# Patient Record
Sex: Male | Born: 1935 | Race: White | Hispanic: No | Smoking: Never smoker
Health system: Southern US, Community
[De-identification: ages and names within clinical notes are randomized; demographics above are authoritative.]

## PROBLEM LIST (undated history)

## (undated) DIAGNOSIS — I639 Cerebral infarction, unspecified: Secondary | ICD-10-CM

## (undated) DIAGNOSIS — I219 Acute myocardial infarction, unspecified: Secondary | ICD-10-CM

## (undated) DIAGNOSIS — I779 Disorder of arteries and arterioles, unspecified: Secondary | ICD-10-CM

## (undated) DIAGNOSIS — I739 Peripheral vascular disease, unspecified: Secondary | ICD-10-CM

## (undated) DIAGNOSIS — I10 Essential (primary) hypertension: Secondary | ICD-10-CM

## (undated) DIAGNOSIS — N182 Chronic kidney disease, stage 2 (mild): Secondary | ICD-10-CM

## (undated) DIAGNOSIS — Z9289 Personal history of other medical treatment: Secondary | ICD-10-CM

## (undated) DIAGNOSIS — I2510 Atherosclerotic heart disease of native coronary artery without angina pectoris: Secondary | ICD-10-CM

## (undated) DIAGNOSIS — E785 Hyperlipidemia, unspecified: Secondary | ICD-10-CM

## (undated) DIAGNOSIS — I5032 Chronic diastolic (congestive) heart failure: Secondary | ICD-10-CM

## (undated) HISTORY — DX: Peripheral vascular disease, unspecified: I73.9

## (undated) HISTORY — DX: Atherosclerotic heart disease of native coronary artery without angina pectoris: I25.10

## (undated) HISTORY — DX: Disorder of arteries and arterioles, unspecified: I77.9

## (undated) HISTORY — DX: Personal history of other medical treatment: Z92.89

---

## 2004-11-15 HISTORY — PX: ANGIOPLASTY: SHX39

## 2005-09-11 ENCOUNTER — Inpatient Hospital Stay (HOSPITAL_COMMUNITY): Admission: EM | Admit: 2005-09-11 | Discharge: 2005-09-14 | Payer: Self-pay | Admitting: Emergency Medicine

## 2006-10-07 ENCOUNTER — Encounter: Admission: RE | Admit: 2006-10-07 | Discharge: 2006-10-07 | Payer: Self-pay | Admitting: Cardiovascular Disease

## 2006-10-12 ENCOUNTER — Ambulatory Visit (HOSPITAL_COMMUNITY): Admission: RE | Admit: 2006-10-12 | Discharge: 2006-10-12 | Payer: Self-pay | Admitting: Cardiovascular Disease

## 2008-11-22 ENCOUNTER — Ambulatory Visit (HOSPITAL_COMMUNITY): Admission: RE | Admit: 2008-11-22 | Discharge: 2008-11-22 | Payer: Self-pay | Admitting: Cardiovascular Disease

## 2009-12-16 DIAGNOSIS — Z9289 Personal history of other medical treatment: Secondary | ICD-10-CM

## 2009-12-16 HISTORY — DX: Personal history of other medical treatment: Z92.89

## 2009-12-21 ENCOUNTER — Inpatient Hospital Stay (HOSPITAL_COMMUNITY): Admission: EM | Admit: 2009-12-21 | Discharge: 2009-12-22 | Payer: Self-pay | Admitting: Emergency Medicine

## 2009-12-22 ENCOUNTER — Encounter (INDEPENDENT_AMBULATORY_CARE_PROVIDER_SITE_OTHER): Payer: Self-pay | Admitting: Cardiovascular Disease

## 2010-08-15 DIAGNOSIS — Z9289 Personal history of other medical treatment: Secondary | ICD-10-CM

## 2010-08-15 HISTORY — DX: Personal history of other medical treatment: Z92.89

## 2011-02-03 LAB — COMPREHENSIVE METABOLIC PANEL
ALT: 14 U/L (ref 0–53)
AST: 24 U/L (ref 0–37)
Albumin: 4 g/dL (ref 3.5–5.2)
Alkaline Phosphatase: 48 U/L (ref 39–117)
BUN: 12 mg/dL (ref 6–23)
CO2: 24 mEq/L (ref 19–32)
Calcium: 9.2 mg/dL (ref 8.4–10.5)
Chloride: 101 mEq/L (ref 96–112)
Creatinine, Ser: 1.01 mg/dL (ref 0.4–1.5)
GFR calc Af Amer: >60 mL/min (ref >60)
GFR calc non Af Amer: >60 mL/min (ref >60)
Glucose, Bld: 131 mg/dL — ABNORMAL HIGH (ref 70–99)
Potassium: 4.2 mEq/L (ref 3.5–5.1)
Sodium: 132 mEq/L — ABNORMAL LOW (ref 135–145)
Total Bilirubin: 0.5 mg/dL (ref 0.3–1.2)
Total Protein: 6.8 g/dL (ref 6.0–8.3)

## 2011-02-03 LAB — BASIC METABOLIC PANEL
BUN: 15 mg/dL (ref 6–23)
BUN: 24 mg/dL — ABNORMAL HIGH (ref 6–23)
CO2: 24 mEq/L (ref 19–32)
CO2: 28 mEq/L (ref 19–32)
Calcium: 8.8 mg/dL (ref 8.4–10.5)
Calcium: 8.8 mg/dL (ref 8.4–10.5)
Chloride: 102 mEq/L (ref 96–112)
Chloride: 97 mEq/L (ref 96–112)
Creatinine, Ser: 1.1 mg/dL (ref 0.4–1.5)
Creatinine, Ser: 1.36 mg/dL (ref 0.4–1.5)
GFR calc Af Amer: >60 mL/min (ref >60)
GFR calc Af Amer: >60 mL/min (ref >60)
GFR calc non Af Amer: 50 mL/min — ABNORMAL LOW (ref >60)
GFR calc non Af Amer: >60 mL/min (ref >60)
Glucose, Bld: 103 mg/dL — ABNORMAL HIGH (ref 70–99)
Glucose, Bld: 86 mg/dL (ref 70–99)
Potassium: 4.1 mEq/L (ref 3.5–5.1)
Potassium: 4.2 mEq/L (ref 3.5–5.1)
Sodium: 129 mEq/L — ABNORMAL LOW (ref 135–145)
Sodium: 134 mEq/L — ABNORMAL LOW (ref 135–145)

## 2011-02-03 LAB — CBC
HCT: 34.1 % — ABNORMAL LOW (ref 39.0–52.0)
HCT: 36.8 % — ABNORMAL LOW (ref 39.0–52.0)
Hemoglobin: 11.8 g/dL — ABNORMAL LOW (ref 13.0–17.0)
Hemoglobin: 12.7 g/dL — ABNORMAL LOW (ref 13.0–17.0)
MCHC: 34.5 g/dL (ref 30.0–36.0)
MCHC: 34.7 g/dL (ref 30.0–36.0)
MCV: 95.9 fL (ref 78.0–100.0)
MCV: 95.9 fL (ref 78.0–100.0)
Platelets: 173 10*3/uL (ref 150–400)
Platelets: 186 10*3/uL (ref 150–400)
RBC: 3.55 MIL/uL — ABNORMAL LOW (ref 4.22–5.81)
RBC: 3.84 MIL/uL — ABNORMAL LOW (ref 4.22–5.81)
RDW: 12.3 % (ref 11.5–15.5)
RDW: 12.7 % (ref 11.5–15.5)
WBC: 6 10*3/uL (ref 4.0–10.5)
WBC: 6.5 10*3/uL (ref 4.0–10.5)

## 2011-02-03 LAB — URINE MICROSCOPIC-ADD ON

## 2011-02-03 LAB — CK TOTAL AND CKMB (NOT AT ARMC)
CK, MB: 3.2 ng/mL (ref 0.3–4.0)
Relative Index: 3.1 — ABNORMAL HIGH (ref 0.0–2.5)
Total CK: 103 U/L (ref 7–232)

## 2011-02-03 LAB — CARDIAC PANEL(CRET KIN+CKTOT+MB+TROPI)
CK, MB: 2.7 ng/mL (ref 0.3–4.0)
CK, MB: 2.7 ng/mL (ref 0.3–4.0)
Relative Index: 2.6 — ABNORMAL HIGH (ref 0.0–2.5)
Relative Index: INVALID (ref 0.0–2.5)
Total CK: 105 U/L (ref 7–232)
Total CK: 97 U/L (ref 7–232)
Troponin I: 0.02 ng/mL (ref 0.00–0.06)
Troponin I: 0.02 ng/mL (ref 0.00–0.06)

## 2011-02-03 LAB — POCT I-STAT, CHEM 8
BUN: 18 mg/dL (ref 6–23)
Calcium, Ion: 1.2 mmol/L (ref 1.12–1.32)
Chloride: 97 mEq/L (ref 96–112)
Creatinine, Ser: 1.2 mg/dL (ref 0.4–1.5)
Glucose, Bld: 133 mg/dL — ABNORMAL HIGH (ref 70–99)
HCT: 34 % — ABNORMAL LOW (ref 39.0–52.0)
Hemoglobin: 11.6 g/dL — ABNORMAL LOW (ref 13.0–17.0)
Potassium: 3.7 mEq/L (ref 3.5–5.1)
Sodium: 131 mEq/L — ABNORMAL LOW (ref 135–145)
TCO2: 29 mmol/L (ref 0–100)

## 2011-02-03 LAB — TROPONIN I: Troponin I: 0.02 ng/mL (ref 0.00–0.06)

## 2011-02-03 LAB — URINALYSIS, ROUTINE W REFLEX MICROSCOPIC
Bilirubin Urine: NEGATIVE
Glucose, UA: NEGATIVE mg/dL
Hgb urine dipstick: NEGATIVE
Ketones, ur: 15 mg/dL — AB
Leukocytes, UA: NEGATIVE
Nitrite: NEGATIVE
Protein, ur: 30 mg/dL — AB
Specific Gravity, Urine: 1.019 (ref 1.005–1.030)
Urobilinogen, UA: 1 mg/dL (ref 0.0–1.0)
pH: 8 (ref 5.0–8.0)

## 2011-02-03 LAB — TSH: TSH: 1.509 u[IU]/mL (ref 0.350–4.500)

## 2011-02-03 LAB — MAGNESIUM: Magnesium: 2.3 mg/dL (ref 1.5–2.5)

## 2011-02-03 LAB — D-DIMER, QUANTITATIVE: D-Dimer, Quant: 0.41 ug/mL-FEU (ref 0.00–0.48)

## 2011-04-02 NOTE — Cardiovascular Report (Signed)
[name], Montgomery                ACCOUNT NO.:  [id number]   MEDICAL RECORD NO.:  [id number]          PATIENT TYPE:  INP   LOCATION:  3729                         FACILITY:  [facility]   PHYSICIAN:  John Montgomery, M.D.DATE OF BIRTH:  October 28, 1921   DATE OF PROCEDURE:  09/13/2005  DATE OF DISCHARGE:                              CARDIAC CATHETERIZATION   PROCEDURES PERFORMED:  1.  Selective coronary angiography by Judkins technique.  2.  Urgent percutaneous coronary intervention to the mid RCA for tight      stenosis and loss of retrograde collaterals from LAD during angiography.  3.  Ostial RCA stenosis requiring ostial stenting.  4.  Left ventricular angiography.  5.  Left and right coronary arteriography.   There were no complications during this case. The shutdown of left-to-right  collaterals occurring during the case that caused ST-segment elevation was  managed with percutaneous intervention immediately.   PATIENT PROFILE:  [name] is an 77 year old gentleman who is a  cardiology patient of Dr. Nanetta Montgomery who had a percutaneous coronary  intervention 12 years ago who had done well in his usual state of health  until several weeks ago when he began having substernal chest pain with  exertion that was relieved by rest. He entered the hospital on September 11, 2005, and had improvement of chest pressure with heparin and intravenous  nitroglycerin. His EKGs have shown mild T-wave abnormalities in the inferior  leads minimally suggestive of ischemia. He has hypertension as well. He was  brought to the catheterization laboratory electively for catheterization and  ultimately underwent emergency percutaneous intervention with a good result  and no residual chest pain at the end of the case.   RESULTS:  Pressures:  Left ventricular pressure was 125/3, end-diastolic  pressure at 13, central aortic pressure 125/50, mean of 85. No aortic valve  gradient by pullback  technique.   ANGIOGRAPHIC RESULTS:  The first injection into the left main coronary  artery and LAD and circumflex system showed calcifications in the circumflex  and proximal LAD. The left main itself was a fairly short vessel, medium in  size, and no significant stenoses were noted. The LAD was proximally  calcified. There were luminal irregularities at the bifurcation of the LAD  and to the major diagonal which was #3 diagonal. There were no significant  stenoses throughout the LAD of an obstructive type. Two very small diagonal  branches were diseased in the ostial portions but these were very small. The  trifurcating third diagonal was minimally diseased.   Left circumflex was nondominant and gave rise to fairly large atrial  circumflex branches which provided a brisk collateral bed to the distal  portions of the RCA. After the initial injection of the LAD and circumflex,  the collateral flow abruptly stopped and was not seen further during the  case, and the patient's ST segments elevated. I continued to take pictures  of the LAD to see what had happened and no high-grade stenoses were seen in  any of the LAD vessels. There was minimal nonobstructive disease.   The  right coronary artery was difficult to intubate. There was scattered  calcification. The right Judkins catheter would not enter. I used a guide  catheter was which was a Cordis XR 3.5C and entered the vessel and then  noted that there was not only an ostial stenosis in the vessel with some  hazy filling of the ostium, but there was also a high-grade stenosis in the  mid portion of the vessel before an acute marginal branch that was 99%  stenosed over a segment approximately 12-15 mm in length. There was now  antegrade flow into the distal RCA posterolateral branches and posterior  descending as well. This RCA was large and dominant. It was the culprit  vessel in the patient's angina.   Because of the ST-segment  elevation, I felt uncomfortable with proceeding  any further with LV angiography. I went straight to Integrilin. I noted that  the ACT was 297 on the patient's discontinued heparin from the floor. We  prepared him with Pepcid and ultimately gave 300 of Plavix and we went  straight to the percutaneous intervention.   RCA intervention was accomplished with the previous guide catheter mentioned  which was the Cordis XR 3.5 guide, which was an excellent choice. The  guidewire was a Prowater and the balloon used to predilate the vessel was a  2.5 x 20 mm Maverick. After redilatation with that I was able to then get a  3.0 x 28 mm stent down into the mid RCA, but only after encountering extreme  difficulty advancing the stent into the left main ostium because of the  calcified plaque that existed there. I was finally able to maneuver the  stent down to the mid portion of the RCA and deployed it twice to 15  atmospheres for periods up to a minute each time, and noted extreme  improvement of and eradication of the previously-stenotic complex mid RCA  stenosis.   I was next able to focus on the ostial portion of the vessel keeping the  same guide wire in place, and was able to deploy a 3.0 x 13 mm ostial Cypher  stent, making sure to get it as close to the ostium of the vessel without  spilling over too much into the aorta. I think I accomplished that by  persistence.   After deployment of that stent, the hazy 75% stenosis was eradicated and  there was no haziness left. The RCA after two-vessel intervention looked  pristine. It was a large dominant vessel with no significant residual  stenosis and there was brisk TIMI 3 flow throughout. The ST segments  basically when away after complete revascularization of the right.   I then turned my attention toward a left ventricular angiogram and confirmed  zero evidence of inferior wall hypokinesis. That ejection fraction was 60% and no mitral  regurgitation was noted.   The patient tolerated the procedure well. The ST segments resolved,  transient hypotension was managed, and the patient left the catheterization  laboratory without any pain, stable vital signs, with an excellent result.   FINAL DIAGNOSES:  1.  Unstable angina secondary to a high-grade stenosis in mid right coronary      artery with complicating ostial right coronary artery disease and a      large dominant right coronary artery.  2.  Left anterior descending coronary artery trivially diseased midway down      and in two diagonals not worthy of or amenable to percutaneous  intervention.  3.  Strong left-to-right collateral flow which was eradicated by the first      injection into the left anterior descending coronary artery causing      abrupt ischemia due to competitive flow.  4.  Successful percutaneous stenting of the mid right coronary artery and      ostial right coronary artery with eradication of high-grade stenoses to      0% residual, and restoration of TIMI 3 flow from TIMI 2 flow initially.   PLAN:  The patient will be on aspirin and Plavix for at least 6 months. He  will be discharged tomorrow if his cardiac enzymes were negative. His EKG  pre and post procedure showed only trivial worsening of his mild T-wave  inversions. There were no ST-segment elevations.           ______________________________  John Montgomery, M.D.     WHG/MEDQ  D:  09/13/2005  T:  09/13/2005  Job:  [id number]   cc:   [facility]

## 2011-04-02 NOTE — Discharge Summary (Signed)
John Montgomery, John Montgomery                ACCOUNT NO.:  [id number]   MEDICAL RECORD NO.:  [id number]          PATIENT TYPE:  INP   LOCATION:  6524                         FACILITY:  [facility]   PHYSICIAN:  Nanetta Batty, M.D.   DATE OF BIRTH:  12-18-1920   DATE OF ADMISSION:  09/11/2005  DATE OF DISCHARGE:  09/14/2005                                 DISCHARGE SUMMARY   ADMISSION DIAGNOSES:  1.  Unstable angina.  2.  Coronary artery disease with a remote history of percutaneous coronary      intervention.  3.  Hypertension.  4.  Hypercholesterolemia.  5.  Right carotid bruit.   DISCHARGE DIAGNOSES:  1.  Coronary artery disease with percutaneous coronary intervention of the      right coronary artery, ostial and mid right coronary artery, using      Cypher stents.  2.  Hypertension.  3.  Hyperlipidemia.   PROCEDURE:  Cardiac catheterization September 13, 2005, Dr. Chanda Busing,  with urgent PTCA intervention to the mid RCA for a tight stenosis and loss  of retrograde collaterals from the LAD, RCA ostial stenosis, requiring  ostial stenting, left ventricular angiography, and right and left coronary  arteriography.   BRIEF HISTORY:  The patient is an 77 year old gentleman with a remote  history of PTCA 12 years ago. He had done well until last several weeks when  he noted subscapular chest pain while riding an exercise bike. On the  morning of admission, he awoke around 3:30 with chest pain, which resolved  with spontaneously. He now presents to the ER with chest discomfort.   PAST MEDICAL HISTORY:  Includes hypertension, hyperlipidemia and prior PTCA.   MEDICATIONS:  1.  Altace 5 mg daily.  2.  Aspirin 81 mg daily.  3.  Norvasc 2.5 mg daily.  4.  Hydrochlorothiazide 12.5 mg daily.  5.  Pravachol 20 mg daily.  6.  Toprol 25 mg daily.   ALLERGIES:  PENICILLIN.   SOCIAL HISTORY:  He lives with 2 sons. He is a widower; 3 children and 8  grandchildren. He is a nonsmoker.   FAMILY  HISTORY:  Noncontributory.   REVIEW OF SYSTEMS:  Negative.   HOSPITAL COURSE:  The patient was admitted. Telemetry showed a sinus rhythm.  There were no significant EKG changes. Troponins were 0.27 following a.m.  and patient was taken to the cath lab. He had been stabilized with heparin  and nitroglycerin. AT that time, cath showed 75% RCA ostial stenosis  followed by an 85% and 99.9% mid RCA stenosis. He had an LAD which was  patent, he had a 90% D2 but otherwise no problems there. The circ and OM  were open. The circ supplied collaterals to the right. Dr. Elsie Lincoln performed  PCI and stenting using a 3 x 13 ostial RCA stent and a 3 x 28 mid RCA Cypher  stent. The patient tolerated the procedure well. The following day was his  birthday. He was mobilized.  Labs looked good. Creatinine was 1.2, BUN was  14, and electrolytes were normal. Hemoglobin was 11.7 and hematocrit was  34.2. White count of 7. Platelets were 162,000.  He was mobilized, did well,  and was subsequently discharged home later that day.   DISCHARGE MEDICATIONS:  1.  Coated aspirin 325 mg daily.  2.  Hydrochlorothiazide 12.5 mg daily.  3.  Norvasc 2.5 mg daily.  4.  Protonix 40 mg daily.  5.  Plavix 75 mg daily.  6.  Toprol XL 25 mg daily.  7.  Pravachol 20 mg daily.  8.  Altace 5 mg daily.  9.  He also was given bacitracin to his skin arm rash.   FOLLOW UP:  He is scheduled to come back and see Dr. Allyson Sabal in 2 weeks.   DISCHARGE ACTIVITY:  Light to moderate. No lifting over 10 pounds. No  driving, no strenuous activity.   CONDITION ON DISCHARGE:  Improved.      Eber Hong, P.A.      Nanetta Batty, M.D.  Electronically Signed    WDJ/MEDQ  D:  11/05/2005  T:  11/08/2005  Job:  [id number]   cc:   Veverly Fells. Altheimer, M.D.  Fax: [phone]

## 2011-04-02 NOTE — Cardiovascular Report (Signed)
NAMEKENT, RIENDEAU                ACCOUNT NO.:  [id number]   MEDICAL RECORD NO.:  [id number]          PATIENT TYPE:  AMB   LOCATION:  SDS                          FACILITY:  [facility]   PHYSICIAN:  Nanetta Batty, M.D.   DATE OF BIRTH:  June 05, 1921   DATE OF PROCEDURE:  DATE OF DISCHARGE:  10/12/2006                            CARDIAC CATHETERIZATION   Mr. Knotek is an 77 year old gentleman with history of CAD status post  remote RCA PCI in 1995.  He has stenting of his RCA by Dr. Elsie Lincoln  September 13, 2005 with Cypher drug-eluting stent.  His other problems  include hypertension and hyperlipidemia.  He is a fairly active man and  walks religiously.  We have been following his carotid studies with the  most recent one performed and revealed progression of disease in his  right bulb and ICA, though he was neurologically asymptomatic.  He  presents now for outpatient diagnostic cerebral angiography to define  his anatomy.   DESCRIPTION OF PROCEDURE:  The patient is brought to the second floor  [facility] angiographic suite in the postabsorptive state.  He is not  premedicated.  His right groin was prepped and shaved in the usual  sterile fashion. Xylocaine 1% was used for local anesthesia.  A 5-French  sheath was inserted into the right femoral artery using standard  Seldinger technique.  A 5-French long tennis racket catheter and JB-1  catheters were used for arch angiography, selective right and left  vertebral, left carotid angiography both intra and extracranial views.  Visipaque dye was used for the entirety of the case.  Aortic pressures  monitored during the case.   ANGIOGRAPHIC RESULTS:  1. Arch angiogram:  The patient had a type 1 arch.  2. Left vertebral:  Normal intra and extracranial anatomy.  3. Right carotid:  There was fluoroscopic calcium at the bifurcation      of the bulb but only about 20% luminal narrowing at the origin of      the right internal carotid artery.  The  intracranial anatomy was      normal.  4. Left carotid:  Normal.  5. Left vertebral:  Normal.   IMPRESSION:  Mr. Magallon has essentially normal cerebral angiogram.  His  carotid Dopplers was false positive.  Sheath was removed and pressure  was held on the groin to achieve hemostasis.  The patient left the lab  in stable condition.  He will be discharged home as an outpatient later  today and was seen back in the office in approximately a week for follow-  up.      Nanetta Batty, M.D.  Electronically Signed    JB/MEDQ  D:  10/12/2006  T:  10/13/2006  Job:  [id number]   cc:   Patient's chart  2nd Floor [facility] Suite  [facility]  Veverly Fells. Altheimer, M.D.

## 2011-11-16 DIAGNOSIS — I639 Cerebral infarction, unspecified: Secondary | ICD-10-CM

## 2011-11-16 HISTORY — DX: Cerebral infarction, unspecified: I63.9

## 2012-10-16 ENCOUNTER — Emergency Department (HOSPITAL_COMMUNITY): Payer: Medicare Other

## 2012-10-16 ENCOUNTER — Encounter (HOSPITAL_COMMUNITY): Payer: Self-pay | Admitting: *Deleted

## 2012-10-16 ENCOUNTER — Inpatient Hospital Stay (HOSPITAL_COMMUNITY)
Admission: EM | Admit: 2012-10-16 | Discharge: 2012-10-20 | DRG: 086 | Disposition: A | Discharge status: Rehabilitation Facility | Payer: Medicare Other | Attending: Neurosurgery | Admitting: Neurosurgery

## 2012-10-16 DIAGNOSIS — D62 Acute posthemorrhagic anemia: Secondary | ICD-10-CM | POA: Diagnosis present

## 2012-10-16 DIAGNOSIS — I480 Paroxysmal atrial fibrillation: Secondary | ICD-10-CM | POA: Diagnosis not present

## 2012-10-16 DIAGNOSIS — E871 Hypo-osmolality and hyponatremia: Secondary | ICD-10-CM | POA: Diagnosis present

## 2012-10-16 DIAGNOSIS — I252 Old myocardial infarction: Secondary | ICD-10-CM

## 2012-10-16 DIAGNOSIS — G8190 Hemiplegia, unspecified affecting unspecified side: Secondary | ICD-10-CM | POA: Diagnosis present

## 2012-10-16 DIAGNOSIS — E785 Hyperlipidemia, unspecified: Secondary | ICD-10-CM | POA: Diagnosis present

## 2012-10-16 DIAGNOSIS — Z9861 Coronary angioplasty status: Secondary | ICD-10-CM

## 2012-10-16 DIAGNOSIS — I4891 Unspecified atrial fibrillation: Secondary | ICD-10-CM | POA: Diagnosis present

## 2012-10-16 DIAGNOSIS — I6529 Occlusion and stenosis of unspecified carotid artery: Secondary | ICD-10-CM | POA: Diagnosis present

## 2012-10-16 DIAGNOSIS — I440 Atrioventricular block, first degree: Secondary | ICD-10-CM | POA: Diagnosis not present

## 2012-10-16 DIAGNOSIS — R4701 Aphasia: Secondary | ICD-10-CM | POA: Diagnosis present

## 2012-10-16 DIAGNOSIS — IMO0002 Reserved for concepts with insufficient information to code with codable children: Secondary | ICD-10-CM | POA: Diagnosis present

## 2012-10-16 DIAGNOSIS — I5189 Other ill-defined heart diseases: Secondary | ICD-10-CM | POA: Diagnosis present

## 2012-10-16 DIAGNOSIS — I2510 Atherosclerotic heart disease of native coronary artery without angina pectoris: Secondary | ICD-10-CM | POA: Diagnosis present

## 2012-10-16 DIAGNOSIS — Z79899 Other long term (current) drug therapy: Secondary | ICD-10-CM

## 2012-10-16 DIAGNOSIS — S06369A Traumatic hemorrhage of cerebrum, unspecified, with loss of consciousness of unspecified duration, initial encounter: Secondary | ICD-10-CM

## 2012-10-16 DIAGNOSIS — I10 Essential (primary) hypertension: Secondary | ICD-10-CM | POA: Diagnosis present

## 2012-10-16 DIAGNOSIS — S0101XA Laceration without foreign body of scalp, initial encounter: Secondary | ICD-10-CM

## 2012-10-16 DIAGNOSIS — I739 Peripheral vascular disease, unspecified: Secondary | ICD-10-CM | POA: Diagnosis present

## 2012-10-16 DIAGNOSIS — S065XAA Traumatic subdural hemorrhage with loss of consciousness status unknown, initial encounter: Principal | ICD-10-CM | POA: Diagnosis present

## 2012-10-16 DIAGNOSIS — Z880 Allergy status to penicillin: Secondary | ICD-10-CM

## 2012-10-16 DIAGNOSIS — S065X9A Traumatic subdural hemorrhage with loss of consciousness of unspecified duration, initial encounter: Secondary | ICD-10-CM | POA: Diagnosis present

## 2012-10-16 DIAGNOSIS — Y92009 Unspecified place in unspecified non-institutional (private) residence as the place of occurrence of the external cause: Secondary | ICD-10-CM

## 2012-10-16 DIAGNOSIS — S0100XA Unspecified open wound of scalp, initial encounter: Secondary | ICD-10-CM | POA: Diagnosis present

## 2012-10-16 HISTORY — DX: Essential (primary) hypertension: I10

## 2012-10-16 HISTORY — DX: Hyperlipidemia, unspecified: E78.5

## 2012-10-16 HISTORY — DX: Acute myocardial infarction, unspecified: I21.9

## 2012-10-16 LAB — CBC WITH DIFFERENTIAL/PLATELET
Basophils Absolute: 0 10*3/uL (ref 0.0–0.1)
Basophils Relative: 0 % (ref 0–1)
Eosinophils Absolute: 0.1 10*3/uL (ref 0.0–0.7)
Eosinophils Relative: 1 % (ref 0–5)
HCT: 32.8 % — ABNORMAL LOW (ref 39.0–52.0)
Hemoglobin: 11.7 g/dL — ABNORMAL LOW (ref 13.0–17.0)
Lymphocytes Relative: 20 % (ref 12–46)
Lymphs Abs: 1.4 10*3/uL (ref 0.7–4.0)
MCH: 31.1 pg (ref 26.0–34.0)
MCHC: 35.7 g/dL (ref 30.0–36.0)
MCV: 87.2 fL (ref 78.0–100.0)
Monocytes Absolute: 0.6 10*3/uL (ref 0.1–1.0)
Monocytes Relative: 9 % (ref 3–12)
Neutro Abs: 4.6 10*3/uL (ref 1.7–7.7)
Neutrophils Relative %: 69 % (ref 43–77)
Platelets: 177 10*3/uL (ref 150–400)
RBC: 3.76 MIL/uL — ABNORMAL LOW (ref 4.22–5.81)
RDW: 12 % (ref 11.5–15.5)
WBC: 6.7 10*3/uL (ref 4.0–10.5)

## 2012-10-16 LAB — COMPREHENSIVE METABOLIC PANEL
ALT: 12 U/L (ref 0–53)
AST: 22 U/L (ref 0–37)
Albumin: 4 g/dL (ref 3.5–5.2)
Alkaline Phosphatase: 51 U/L (ref 39–117)
BUN: 19 mg/dL (ref 6–23)
CO2: 22 mEq/L (ref 19–32)
Calcium: 9.1 mg/dL (ref 8.4–10.5)
Chloride: 92 mEq/L — ABNORMAL LOW (ref 96–112)
Creatinine, Ser: 1.08 mg/dL (ref 0.50–1.35)
GFR calc Af Amer: 68 mL/min — ABNORMAL LOW (ref >90)
GFR calc non Af Amer: 59 mL/min — ABNORMAL LOW (ref >90)
Glucose, Bld: 97 mg/dL (ref 70–99)
Potassium: 4.1 mEq/L (ref 3.5–5.1)
Sodium: 125 mEq/L — ABNORMAL LOW (ref 135–145)
Total Bilirubin: 0.3 mg/dL (ref 0.3–1.2)
Total Protein: 6.7 g/dL (ref 6.0–8.3)

## 2012-10-16 LAB — PLATELET FUNCTION ASSAY: Collagen / Epinephrine: 104 seconds (ref 0–184)

## 2012-10-16 LAB — POCT I-STAT, CHEM 8
BUN: 20 mg/dL (ref 6–23)
Calcium, Ion: 1.14 mmol/L (ref 1.13–1.30)
Chloride: 97 mEq/L (ref 96–112)
Creatinine, Ser: 1.1 mg/dL (ref 0.50–1.35)
Glucose, Bld: 96 mg/dL (ref 70–99)
HCT: 35 % — ABNORMAL LOW (ref 39.0–52.0)
Hemoglobin: 11.9 g/dL — ABNORMAL LOW (ref 13.0–17.0)
Potassium: 4.1 mEq/L (ref 3.5–5.1)
Sodium: 128 mEq/L — ABNORMAL LOW (ref 135–145)
TCO2: 21 mmol/L (ref 0–100)

## 2012-10-16 LAB — SAMPLE TO BLOOD BANK

## 2012-10-16 LAB — ABO/RH: ABO/RH(D): A POS

## 2012-10-16 LAB — PROTIME-INR
INR: 0.99 (ref 0.00–1.49)
Prothrombin Time: 13 seconds (ref 11.6–15.2)

## 2012-10-16 MED ORDER — ROCURONIUM BROMIDE 50 MG/5ML IV SOLN
INTRAVENOUS | Status: AC
  Filled 2012-10-16: qty 2

## 2012-10-16 MED ORDER — MORPHINE SULFATE 2 MG/ML IJ SOLN
INTRAMUSCULAR | Status: AC
  Administered 2012-10-16: 2 mg via INTRAVENOUS
  Filled 2012-10-16: qty 1

## 2012-10-16 MED ORDER — MORPHINE SULFATE 2 MG/ML IJ SOLN
1.0000 mg | INTRAMUSCULAR | Status: DC | PRN
  Administered 2012-10-17: 2 mg via INTRAVENOUS
  Filled 2012-10-16: qty 1

## 2012-10-16 MED ORDER — ACETAMINOPHEN-CODEINE #3 300-30 MG PO TABS
1.0000 | ORAL_TABLET | ORAL | Status: DC | PRN
  Administered 2012-10-18 – 2012-10-20 (×3): 1 via ORAL
  Filled 2012-10-16 (×3): qty 1

## 2012-10-16 MED ORDER — SODIUM CHLORIDE 0.9 % IV SOLN
INTRAVENOUS | Status: DC
Start: 2012-10-16 — End: 2012-10-20
  Administered 2012-10-16 – 2012-10-18 (×3): via INTRAVENOUS
  Administered 2012-10-18: 1000 mL via INTRAVENOUS
  Administered 2012-10-19: 10:00:00 via INTRAVENOUS
  Administered 2012-10-19 – 2012-10-20 (×2): 1000 mL via INTRAVENOUS

## 2012-10-16 MED ORDER — ACETAMINOPHEN 650 MG RE SUPP: 650.0000 mg | RECTAL | Status: DC | PRN

## 2012-10-16 MED ORDER — NICARDIPINE HCL IN NACL 20-0.86 MG/200ML-% IV SOLN
5.0000 mg/h | Freq: Once | INTRAVENOUS | Status: DC
  Filled 2012-10-16: qty 200

## 2012-10-16 MED ORDER — LIDOCAINE HCL (CARDIAC) 20 MG/ML IV SOLN
INTRAVENOUS | Status: AC
  Filled 2012-10-16: qty 5

## 2012-10-16 MED ORDER — ACETAMINOPHEN 325 MG PO TABS: 650.0000 mg | ORAL_TABLET | ORAL | Status: DC | PRN

## 2012-10-16 MED ORDER — SENNOSIDES-DOCUSATE SODIUM 8.6-50 MG PO TABS
1.0000 | ORAL_TABLET | Freq: Two times a day (BID) | ORAL | Status: DC
  Administered 2012-10-17 – 2012-10-20 (×5): 1 via ORAL
  Filled 2012-10-16 (×9): qty 1

## 2012-10-16 MED ORDER — ETOMIDATE 2 MG/ML IV SOLN
INTRAVENOUS | Status: AC
  Filled 2012-10-16: qty 20

## 2012-10-16 MED ORDER — LABETALOL HCL 5 MG/ML IV SOLN
10.0000 mg | INTRAVENOUS | Status: DC | PRN
  Administered 2012-10-17 – 2012-10-20 (×5): 10 mg via INTRAVENOUS
  Filled 2012-10-16 (×4): qty 4

## 2012-10-16 MED ORDER — TETANUS-DIPHTH-ACELL PERTUSSIS 5-2.5-18.5 LF-MCG/0.5 IM SUSP
0.5000 mL | Freq: Once | INTRAMUSCULAR | Status: AC
  Administered 2012-10-16: 0.5 mL via INTRAMUSCULAR
  Filled 2012-10-16: qty 0.5

## 2012-10-16 MED ORDER — ONDANSETRON HCL 4 MG/2ML IJ SOLN
4.0000 mg | Freq: Four times a day (QID) | INTRAMUSCULAR | Status: DC | PRN
  Administered 2012-10-17: 4 mg via INTRAVENOUS
  Filled 2012-10-16: qty 2

## 2012-10-16 MED ORDER — SUCCINYLCHOLINE CHLORIDE 20 MG/ML IJ SOLN
INTRAMUSCULAR | Status: AC
  Filled 2012-10-16: qty 1

## 2012-10-16 MED ORDER — PANTOPRAZOLE SODIUM 40 MG IV SOLR
40.0000 mg | Freq: Every day | INTRAVENOUS | Status: DC
  Administered 2012-10-17 – 2012-10-18 (×3): 40 mg via INTRAVENOUS
  Filled 2012-10-16 (×4): qty 40

## 2012-10-16 NOTE — ED Notes (Signed)
Dr. Freida Busman at bedside suturing head laceration, bleeding controlled at this time

## 2012-10-16 NOTE — ED Notes (Signed)
Pt arrived via [facility] and was knocked down by a dog and is on plavix and asa about one hours ago.  Pt with right leg weakness.  EMS reported constricted pupils

## 2012-10-16 NOTE — ED Provider Notes (Signed)
History     CSN: [id number]  Arrival date & time 10/16/12  1539   First MD Initiated Contact with Patient 10/16/12 1556     Chief Complaint  Patient presents with  . Trauma   HPI: John Montgomery is an 78 yo CM with history of CAD and HTN on chronic ASA and plavix therapy, who presents as level II trauma activation after falling. A large dog jumped on him. He fell backward, striking his head on the ground. He did not have LOC or amnesia to the events. He arrived in full c-spine precautions, per EMS he did not have any nausea or vomiting. He remained HD stable. On arrival he had no complaints, was alert and oriented x3, NV intact and normotensive.   Past Medical History  Diagnosis Date  . Hypertension   . Hyperlipidemia   . Myocardial infarction     Past Surgical History  Procedure Date  . Angioplasty     No family history on file.  History  Substance Use Topics  . Smoking status: Not on file  . Smokeless tobacco: Not on file  . Alcohol Use: No    Review of Systems  Constitutional: Negative for fever, chills and appetite change.  Eyes: Negative for photophobia and visual disturbance.  Respiratory: Negative for cough and chest tightness.   Cardiovascular: Negative for chest pain and palpitations.  Gastrointestinal: Negative for nausea, vomiting and abdominal pain.  Genitourinary: Negative for dysuria and decreased urine volume.  Musculoskeletal: Negative for myalgias, back pain and arthralgias.  Skin: Positive for wound. Negative for pallor and rash.  Neurological: Positive for weakness (right arm and leg weakness). Negative for dizziness and headaches.  Psychiatric/Behavioral: Positive for confusion (on reevaluation). Negative for agitation.  All other systems reviewed and are negative.   Allergies  Review of patient's allergies indicates no known allergies.  Home Medications  No current outpatient prescriptions on file.  BP 155/60  Pulse 103  Temp 100.5 F (38.1 C)  (Rectal)  Resp 22  SpO2 100%  Physical Exam  Constitutional: He is oriented to person, place, and time. He is cooperative. Cervical collar and backboard in place.       Frail appearing elderly gentleman, well groomed, no acute distress   HENT:  Head: Normocephalic. Head is with laceration (posterior left scalp).  Mouth/Throat: Oropharynx is clear and moist and mucous membranes are normal.  Eyes: Conjunctivae normal and EOM are normal.       Right pupil irregular, h/o cataract surgery  Left pupil round and reactive   Neck: Trachea normal. No JVD present.  Cardiovascular: Regular rhythm, S1 normal and S2 normal.  Tachycardia present.  Exam reveals decreased pulses (1+ X 4).   Murmur heard.  Systolic murmur is present with a grade of 2/6  Pulmonary/Chest: Effort normal and breath sounds normal. He has no decreased breath sounds.  Abdominal: Soft. Normal appearance and bowel sounds are normal. There is no tenderness.  Neurological: He is alert and oriented to person, place, and time. He has normal strength and normal reflexes. No cranial nerve deficit or sensory deficit. GCS eye subscore is 4. GCS verbal subscore is 5. GCS motor subscore is 6.       Initial neurologic exam normal. On reevaluation developed right sided weakness c/w known left-sided intracranial hemorrhage     ED Course  LACERATION REPAIR Date/Time: 10/16/2012 7:30 PM Performed by: John Montgomery Authorized by: John Montgomery Consent: The procedure was performed in an emergent situation. Patient identity  confirmed: arm band and provided demographic data Time out: Immediately prior to procedure a "time out" was called to verify the correct patient, procedure, equipment, support staff and site/side marked as required. Body area: head/neck Location details: scalp Laceration length: 1 cm Tendon involvement: none Nerve involvement: none Vascular damage: no Anesthesia: local infiltration Local anesthetic: lidocaine 1% with  epinephrine Anesthetic total: 3 ml Patient sedated: no Preparation: Patient was prepped and draped in the usual sterile fashion. Irrigation solution: saline Irrigation method: syringe Amount of cleaning: extensive Skin closure: staples Number of sutures: 2 Approximation: close Approximation difficulty: simple Dressing: gauze packing Comments: Initially placed 2 sutures for hemostasis on arrival, they were removed prior to thorough irrigation and placement of staples.    Labs Reviewed  POCT I-STAT, CHEM 8 - Abnormal; Notable for the following:    Sodium 128 (*)     Hemoglobin 11.9 (*)     HCT 35.0 (*)     All other components within normal limits  CBC WITH DIFFERENTIAL - Abnormal; Notable for the following:    RBC 3.76 (*)     Hemoglobin 11.7 (*)     HCT 32.8 (*)     All other components within normal limits  COMPREHENSIVE METABOLIC PANEL - Abnormal; Notable for the following:    Sodium 125 (*)     Chloride 92 (*)     GFR calc non Af Amer 59 (*)     GFR calc Af Amer 68 (*)     All other components within normal limits  PROTIME-INR  PLATELET FUNCTION ASSAY  PREPARE PLATELET PHERESIS  SAMPLE TO BLOOD BANK  ABO/RH  MRSA PCR SCREENING  APTT  PROTIME-INR  CBC   Ct Head Wo Contrast  10/16/2012  *RADIOLOGY REPORT*  Clinical Data:  Trauma.  The patient is on Plavix.  CT HEAD WITHOUT CONTRAST CT CERVICAL SPINE WITHOUT CONTRAST  Technique:  Multidetector CT imaging of the head and cervical spine was performed following the standard protocol without intravenous contrast.  Multiplanar CT image reconstructions of the cervical spine were also generated.  Comparison:  CT head without contrast 12/21/2009.  CT HEAD  Findings: A high posterior left frontal lobe hemorrhage measures 2.3 x 3.6 x 3.5 cm.  There is surrounding edema with effacement of sulci.  No midline shift is evident.  No extra-axial or interventricular hemorrhage is present.  There is a focal scalp hematoma over the left  parietal scalp without an underlying fracture.  This atrophy white matter disease is otherwise stable. No acute cortical infarct is present.  No significant extra-axial fluid collection is present.  The paranasal sinuses and mastoid air cells are clear. Atherosclerotic calcifications are noted within the cavernous carotid arteries.  The osseous skull is intact.  IMPRESSION:  1.  2.3 x 3.6 x 3.5 cm high posterior left frontal lobe parenchymal hemorrhage without interventricular or extra-axial extension. 2.  Surrounding edema and local mass effect without significant midline shift. 3.  Stable atrophy and white matter disease otherwise. 4.  Atherosclerosis.  CT CERVICAL SPINE  Findings: The cervical spine is imaged from skull base through T1- 2.  Chronic endplate sclerotic changes are present at C6-7. Osseous foraminal narrowing is present on the left at C4-5.  No acute fracture or traumatic subluxation is evident.  The soft tissues are unremarkable.  Biapical pleural parenchymal scarring is stable.  IMPRESSION:  1.  Stable spondylosis of the cervical spine. 2.  No acute fracture or traumatic subluxation.  Critical Value/emergent results were  called by telephone at the time of interpretation on 10/16/2012 at 5 o'clock p.m. to John Montgomery, who verbally acknowledged these results.   Original Report Authenticated By: John Montgomery, M.D.    Ct Cervical Spine Wo Contrast  10/16/2012  *RADIOLOGY REPORT*  Clinical Data:  Trauma.  The patient is on Plavix.  CT HEAD WITHOUT CONTRAST CT CERVICAL SPINE WITHOUT CONTRAST  Technique:  Multidetector CT imaging of the head and cervical spine was performed following the standard protocol without intravenous contrast.  Multiplanar CT image reconstructions of the cervical spine were also generated.  Comparison:  CT head without contrast 12/21/2009.  CT HEAD  Findings: A high posterior left frontal lobe hemorrhage measures 2.3 x 3.6 x 3.5 cm.  There is surrounding edema with  effacement of sulci.  No midline shift is evident.  No extra-axial or interventricular hemorrhage is present.  There is a focal scalp hematoma over the left parietal scalp without an underlying fracture.  This atrophy white matter disease is otherwise stable. No acute cortical infarct is present.  No significant extra-axial fluid collection is present.  The paranasal sinuses and mastoid air cells are clear. Atherosclerotic calcifications are noted within the cavernous carotid arteries.  The osseous skull is intact.  IMPRESSION:  1.  2.3 x 3.6 x 3.5 cm high posterior left frontal lobe parenchymal hemorrhage without interventricular or extra-axial extension. 2.  Surrounding edema and local mass effect without significant midline shift. 3.  Stable atrophy and white matter disease otherwise. 4.  Atherosclerosis.  CT CERVICAL SPINE  Findings: The cervical spine is imaged from skull base through T1- 2.  Chronic endplate sclerotic changes are present at C6-7. Osseous foraminal narrowing is present on the left at C4-5.  No acute fracture or traumatic subluxation is evident.  The soft tissues are unremarkable.  Biapical pleural parenchymal scarring is stable.  IMPRESSION:  1.  Stable spondylosis of the cervical spine. 2.  No acute fracture or traumatic subluxation.  Critical Value/emergent results were called by telephone at the time of interpretation on 10/16/2012 at 5 o'clock p.m. to John Montgomery, who verbally acknowledged these results.   Original Report Authenticated By: John Montgomery, M.D.    Dg Chest Portable 1 View  10/16/2012  *RADIOLOGY REPORT*  Clinical Data: Trauma.  PORTABLE CHEST - 1 VIEW  Comparison: 12/21/2009  Findings: Midline trachea.  Normal heart size.  No pleural effusion or pneumothorax.  Mild apical lordotic patient positioning. Clear lungs.  IMPRESSION: No acute cardiopulmonary disease.  No acute or post-traumatic deformity identified.   Original Report Authenticated By: John Montgomery, M.D.     1. Intraparenchymal hematoma of brain due to trauma   2. Scalp laceration    MDM  78 yo CM with history of CAD and HTN on chronic ASA and plavix therapy, who presented as level II trauma activation after falling. Afebrile, vital signs stable on arrival. Initially GCS of 15, no apparent neurologic deficits. Posterior scalp laceration that required 2 sutures to achieve hemostasis on arrival. No other apparent trauma externally. Patient taken to CT scanner which revealed left frontal IPH with surrounding edema. No shift noted. Patient reevaluated at that time and noted to have right sided weakness, with worsening mental status, GCS down to 13. Continued to protect his airway. Blood pressure adequate in the 140's systolic. Labs: Na 125, Cr 1.08, Hgb 11.7, plts 177. Chest x-ray without acute abnormality. Patient given platelets as he is on Plavix and ASA daily. No coagulopathy, no FFP indicated. Tetanus updated.  Hemostatic sutures removed and wound thoroughly irrigated. Closed with 2 staples, see procedure note above for further. Discussed case with NSU who evaluated the patient and will admit to the ICU for continued management. Discussed results of w/u with patient's family. Patient blood pressure remained less than 160 systolic, therefore no Cardene was initiated. Patient transported to ICU in stable condition.   Reviewed imaging, labs and previous medical records and utilized in MDM  Discussed case with John Montgomery  Clinical Impression 1. Traumatic left frontal intraparenchymal hemorrhage 2. Hyponatremia 3. Acute blood loss anemia.         John Billet, MD 10/16/12 [phone]

## 2012-10-16 NOTE — Progress Notes (Signed)
10/16/12 1600  Clinical Encounter Type  Visited With Health care provider  Visit Type Trauma   Patient not available to be seen in the Trauma unit. I spoke to the nurse and the patient is doing fine  No family present. Recommend follow up. Veryl Speak

## 2012-10-16 NOTE — ED Provider Notes (Signed)
Medical screening examination/treatment/procedure(s) were conducted as a shared visit with non-physician practitioner(s) and myself.  I personally evaluated the patient during the encounter.  89yM presenting as trauma alert. Mechanical fall. Knocked over by dog. Struck back of head. No reported LOC. Head lac. ON ASA and plavix. GCS 13. E4, M6, V3. Not following commands with R side but noted to spontaneously move RLE. Holding R arm flexed across chest. R pupil irregular. Previous cataract surgery per family.  No midline spinal tenderness. No respiratory distress. Lungs clear. CW nontender. Abdomen benign. No external signs of extremity trauma. No bony tenderness of extremities or apparent pain with passive ROM. Head CT with L sided intraparenchymal bleed. Although not typical of traumatic bleed there was antecedent trauma. This is also high for typical hypertensive bleed. Will discuss with neurosurgery. Admit.   5:30 PM Discussed case with neurosurgery. Will admit.   Ct Head Wo Contrast  10/16/2012  *RADIOLOGY REPORT*  Clinical Data:  Trauma.  The patient is on Plavix.  CT HEAD WITHOUT CONTRAST CT CERVICAL SPINE WITHOUT CONTRAST  Technique:  Multidetector CT imaging of the head and cervical spine was performed following the standard protocol without intravenous contrast.  Multiplanar CT image reconstructions of the cervical spine were also generated.  Comparison:  CT head without contrast 12/21/2009.  CT HEAD  Findings: A high posterior left frontal lobe hemorrhage measures 2.3 x 3.6 x 3.5 cm.  There is surrounding edema with effacement of sulci.  No midline shift is evident.  No extra-axial or interventricular hemorrhage is present.  There is a focal scalp hematoma over the left parietal scalp without an underlying fracture.  This atrophy white matter disease is otherwise stable. No acute cortical infarct is present.  No significant extra-axial fluid collection is present.  The paranasal sinuses and mastoid  air cells are clear. Atherosclerotic calcifications are noted within the cavernous carotid arteries.  The osseous skull is intact.  IMPRESSION:  1.  2.3 x 3.6 x 3.5 cm high posterior left frontal lobe parenchymal hemorrhage without interventricular or extra-axial extension. 2.  Surrounding edema and local mass effect without significant midline shift. 3.  Stable atrophy and white matter disease otherwise. 4.  Atherosclerosis.  CT CERVICAL SPINE  Findings: The cervical spine is imaged from skull base through T1- 2.  Chronic endplate sclerotic changes are present at C6-7. Osseous foraminal narrowing is present on the left at C4-5.  No acute fracture or traumatic subluxation is evident.  The soft tissues are unremarkable.  Biapical pleural parenchymal scarring is stable.  IMPRESSION:  1.  Stable spondylosis of the cervical spine. 2.  No acute fracture or traumatic subluxation.  Critical Value/emergent results were called by telephone at the time of interpretation on 10/16/2012 at 5 o'clock p.m. to Dr. Juleen China, who verbally acknowledged these results.   Original Report Authenticated By: Marin Roberts, M.D.    Ct Cervical Spine Wo Contrast  10/16/2012  *RADIOLOGY REPORT*  Clinical Data:  Trauma.  The patient is on Plavix.  CT HEAD WITHOUT CONTRAST CT CERVICAL SPINE WITHOUT CONTRAST  Technique:  Multidetector CT imaging of the head and cervical spine was performed following the standard protocol without intravenous contrast.  Multiplanar CT image reconstructions of the cervical spine were also generated.  Comparison:  CT head without contrast 12/21/2009.  CT HEAD  Findings: A high posterior left frontal lobe hemorrhage measures 2.3 x 3.6 x 3.5 cm.  There is surrounding edema with effacement of sulci.  No midline shift is evident.  No extra-axial or interventricular hemorrhage is present.  There is a focal scalp hematoma over the left parietal scalp without an underlying fracture.  This atrophy white matter disease  is otherwise stable. No acute cortical infarct is present.  No significant extra-axial fluid collection is present.  The paranasal sinuses and mastoid air cells are clear. Atherosclerotic calcifications are noted within the cavernous carotid arteries.  The osseous skull is intact.  IMPRESSION:  1.  2.3 x 3.6 x 3.5 cm high posterior left frontal lobe parenchymal hemorrhage without interventricular or extra-axial extension. 2.  Surrounding edema and local mass effect without significant midline shift. 3.  Stable atrophy and white matter disease otherwise. 4.  Atherosclerosis.  CT CERVICAL SPINE  Findings: The cervical spine is imaged from skull base through T1- 2.  Chronic endplate sclerotic changes are present at C6-7. Osseous foraminal narrowing is present on the left at C4-5.  No acute fracture or traumatic subluxation is evident.  The soft tissues are unremarkable.  Biapical pleural parenchymal scarring is stable.  IMPRESSION:  1.  Stable spondylosis of the cervical spine. 2.  No acute fracture or traumatic subluxation.  Critical Value/emergent results were called by telephone at the time of interpretation on 10/16/2012 at 5 o'clock p.m. to Dr. Juleen China, who verbally acknowledged these results.   Original Report Authenticated By: Marin Roberts, M.D.    Dg Chest Portable 1 View  10/16/2012  *RADIOLOGY REPORT*  Clinical Data: Trauma.  PORTABLE CHEST - 1 VIEW  Comparison: 12/21/2009  Findings: Midline trachea.  Normal heart size.  No pleural effusion or pneumothorax.  Mild apical lordotic patient positioning. Clear lungs.  IMPRESSION: No acute cardiopulmonary disease.  No acute or post-traumatic deformity identified.   Original Report Authenticated By: Jeronimo Greaves, M.D.     CRITICAL CARE Performed by: Raeford Razor   Total critical care time:  35 minutes   Critical care time was exclusive of separately billable procedures and treating other patients.  Critical care was necessary to treat or prevent  imminent or life-threatening deterioration.  Critical care was time spent personally by me on the following activities: development of treatment plan with patient and/or surrogate as well as nursing, discussions with consultants, evaluation of patient's response to treatment, examination of patient, obtaining history from patient or surrogate, ordering and performing treatments and interventions, ordering and review of laboratory studies, ordering and review of radiographic studies, pulse oximetry and re-evaluation of patient's condition.   Raeford Razor, MD 10/16/12 [phone]

## 2012-10-16 NOTE — ED Notes (Addendum)
Soft C- collar placed with MD Freida Busman for pt comfort. Family at bedside. Cardene held at this time per MD Freida Busman. BP 144/65. Family at bedside. VSS.

## 2012-10-16 NOTE — ED Notes (Signed)
See trauma narrator 

## 2012-10-16 NOTE — ED Notes (Signed)
Dr. Freida Busman numbing area to posterior head to apply staples to control bleeding.

## 2012-10-16 NOTE — ED Notes (Signed)
Family at bedside. 

## 2012-10-17 LAB — CBC
HCT: 30.8 % — ABNORMAL LOW (ref 39.0–52.0)
Hemoglobin: 11 g/dL — ABNORMAL LOW (ref 13.0–17.0)
MCV: 87 fL (ref 78.0–100.0)
RBC: 3.54 MIL/uL — ABNORMAL LOW (ref 4.22–5.81)
WBC: 14.4 10*3/uL — ABNORMAL HIGH (ref 4.0–10.5)

## 2012-10-17 LAB — PREPARE PLATELET PHERESIS: Unit division: 0

## 2012-10-17 LAB — PROTIME-INR: INR: 1.06 (ref 0.00–1.49)

## 2012-10-17 NOTE — Evaluation (Signed)
Speech Language Pathology Evaluation Patient Details Name: John Montgomery MRN: [id number] DOB: July 13, 1921 Today's Date: 10/17/2012 Time: 1030-1100 SLP Time Calculation (min): 30 min  Problem List: There is no problem list on file for this patient.  Past Medical History:  Past Medical History  Diagnosis Date  . Hypertension   . Hyperlipidemia   . Myocardial infarction    Past Surgical History:  Past Surgical History  Procedure Date  . Angioplasty    HPI:  John Montgomery while outside his home walking was presumed to have knocked over by a neighbors dog. He was seen walking by his son when a school bus stopped and the driver seemed to be asking questions. When he kept walking his son noticed blood about his face and head. He was alert but not quite right according to his son. According to his son his speech started to deteriorate soon after. He was not making sense and could not speak normally. He remained alert, but was not using his right side normally, At [place] a head ct revealed a hematoma in the left frontal lobe very near the motor area. No midline shift, basal cisterns widely patent, general cerebral atrophy.    Assessment / Plan / Recommendation Clinical Impression  Pt presents with diffuse cognitive-linguistic deficits characterized primarily by inattention with poor focus, and auditory disassociation with inability to follow commands, or use environmental cues to faciliate understanding.  Speech is generally rapid, fluent, low-volume output with poor intelligibility.  Occasional islands of clear, appropriate speech were noted ("I'd like a little more"), but output is generally incoherent at this time.  Spent time educating daughter re: minimizing distractions, focusing attention, using simple language, and reducing communication demands.  Rec SLP f/u to address deficits described above.      SLP Assessment  Patient needs continued Speech Language Pathology Services    Follow Up  Recommendations  Inpatient Rehab    Frequency and Duration min 3x week  2 weeks   Pertinent Vitals/Pain Unable to state/appeared to be resting comfortably   SLP Goals  SLP Goals Potential to Achieve Goals: Fair Progress/Goals/Alternative treatment plan discussed with pt/caregiver and they: Agree SLP Goal #1: Pt will focus/sustain attention to functional task for 10-15 seconds with max assist SLP Goal #2: Pt will follow one-step commands in functional context with max assist. SLP Goal #3: Pt will state name and biographical information with verbal/tactile cues and max assist.  SLP Evaluation Prior Functioning  Cognitive/Linguistic Baseline: Within functional limits Vocation: Other (comment) ("very sharp" per daughter, active, walks daily, social)   Cognition  Overall Cognitive Status: Impaired Arousal/Alertness: Awake/alert Orientation Level: Disoriented X4 Attention: Focused Focused Attention: Impaired Focused Attention Impairment: Verbal basic;Functional basic Awareness: Impaired Awareness Impairment: Intellectual impairment Problem Solving: Impaired Problem Solving Impairment: Verbal basic;Functional basic Safety/Judgment: Impaired    Comprehension  Auditory Comprehension Overall Auditory Comprehension: Impaired Yes/No Questions: Impaired Basic Biographical Questions: 0-25% accurate Commands: Impaired One Step Basic Commands: 0-24% accurate Conversation: Simple Other Conversation Comments: severely impaired understanding of basic auditory input Interfering Components: Attention EffectiveTechniques: Extra processing time;Pausing;Visual/Gestural cues Visual Recognition/Discrimination Discrimination: Exceptions to [facility] Common Objects: Unable to indentify Reading Comprehension Reading Status: Impaired Word level:  (read single word aloud; unable to focus for subsequent words) Sentence Level: Impaired Interfering Components: Attention;Processing time    Expression  Expression Primary Mode of Expression: Verbal Verbal Expression Automatic Speech:  (intermittent ability to say name; no other automatics) Level of Generative/Spontaneous Verbalization: Sentence Repetition: Impaired Naming: Impairment Responsive: 0-25% accurate Confrontation:  Impaired Common Objects: Unable to indentify Convergent: 0-24% accurate Interfering Components: Attention;Speech intelligibility Written Expression Dominant Hand: Right   Oral / Motor Oral Motor/Sensory Function Overall Oral Motor/Sensory Function: Appears within functional limits for tasks assessed Motor Speech Overall Motor Speech: Appears within functional limits for tasks assessed   John Montgomery John Montgomery, John Montgomery [phone]      John Montgomery 10/17/2012, 11:32 AM

## 2012-10-17 NOTE — Progress Notes (Signed)
Patient ID: John Montgomery, male   DOB: 06/09/1921, 78 y.o.   MRN: [id number] BP 157/52  Pulse 79  Temp 97.7 F (36.5 C) (Oral)  Resp 18  Ht 5\' 5"  (1.651 m)  Wt 55.9 kg (123 lb 3.8 oz)  BMI 20.51 kg/m2  SpO2 99% Alert and oriented to person. Follows some commands. Speech is nonfluent, clearer than it was last night.  Will check CT tomorrow Moving left side well, right lower extremity less well. Not moving right upper extremity Improved since last evening.

## 2012-10-17 NOTE — H&P (Signed)
John Montgomery is an 78 y.o. male.   Chief Complaint: Intracerebral hemorrhage, aphasia HPI: John Montgomery while outside his home walking was presumed to have knocked over by a neighbors dog. He was seen walking by his son when a school bus stopped and the driver seemed to be asking questions. When he kept walking his son noticed blood about his face and head. He was alert but not quite right according to his son. According to his son his speech started to deteriorate soon after. He was not making sense and could not speak normally. He remained alert, but was not using his right side normally, At [facility] ct revealed a hematoma in the left frontal lobe very near the motor area. No midline shift, basal cisterns widely patent, general cerebral atrophy.   Past Medical History  Diagnosis Date  . Hypertension   . Hyperlipidemia   . Myocardial infarction     Past Surgical History  Procedure Date  . Angioplasty     No family history on file. Social History:  does not have a smoking history on file. He does not have any smokeless tobacco history on file. He reports that he does not drink alcohol or use illicit drugs.  Allergies: No Known Allergies  No prescriptions prior to admission    Results for orders placed during the hospital encounter of 10/16/12 (from the past 48 hour(s))  SAMPLE TO BLOOD BANK     Status: Normal   Collection Time   10/16/12  4:00 PM      Component Value Range Comment   Blood Bank Specimen SAMPLE AVAILABLE FOR TESTING      Sample Expiration 10/17/2012     ABO/RH     Status: Normal   Collection Time   10/16/12  4:00 PM      Component Value Range Comment   ABO/RH(D) A POS     POCT I-STAT, CHEM 8     Status: Abnormal   Collection Time   10/16/12  4:18 PM      Component Value Range Comment   Sodium 128 (*) 135 - 145 mEq/L    Potassium 4.1  3.5 - 5.1 mEq/L    Chloride 97  96 - 112 mEq/L    BUN 20  6 - 23 mg/dL    Creatinine, Ser 4.09  0.50 - 1.35 mg/dL    Glucose,  Bld 96  70 - 99 mg/dL    Calcium, Ion 8.11  9.14 - 1.30 mmol/L    TCO2 21  0 - 100 mmol/L    Hemoglobin 11.9 (*) 13.0 - 17.0 g/dL    HCT 78.2 (*) 95.6 - 52.0 %   CBC WITH DIFFERENTIAL     Status: Abnormal   Collection Time   10/16/12  4:47 PM      Component Value Range Comment   WBC 6.7  4.0 - 10.5 K/uL    RBC 3.76 (*) 4.22 - 5.81 MIL/uL    Hemoglobin 11.7 (*) 13.0 - 17.0 g/dL    HCT 21.3 (*) 08.6 - 52.0 %    MCV 87.2  78.0 - 100.0 fL    MCH 31.1  26.0 - 34.0 pg    MCHC 35.7  30.0 - 36.0 g/dL    RDW 57.8  46.9 - 62.9 %    Platelets 177  150 - 400 K/uL    Neutrophils Relative 69  43 - 77 %    Neutro Abs 4.6  1.7 - 7.7 K/uL  Lymphocytes Relative 20  12 - 46 %    Lymphs Abs 1.4  0.7 - 4.0 K/uL    Monocytes Relative 9  3 - 12 %    Monocytes Absolute 0.6  0.1 - 1.0 K/uL    Eosinophils Relative 1  0 - 5 %    Eosinophils Absolute 0.1  0.0 - 0.7 K/uL    Basophils Relative 0  0 - 1 %    Basophils Absolute 0.0  0.0 - 0.1 K/uL   COMPREHENSIVE METABOLIC PANEL     Status: Abnormal   Collection Time   10/16/12  4:47 PM      Component Value Range Comment   Sodium 125 (*) 135 - 145 mEq/L    Potassium 4.1  3.5 - 5.1 mEq/L    Chloride 92 (*) 96 - 112 mEq/L    CO2 22  19 - 32 mEq/L    Glucose, Bld 97  70 - 99 mg/dL    BUN 19  6 - 23 mg/dL    Creatinine, Ser 1.61  0.50 - 1.35 mg/dL    Calcium 9.1  8.4 - 09.6 mg/dL    Total Protein 6.7  6.0 - 8.3 g/dL    Albumin 4.0  3.5 - 5.2 g/dL    AST 22  0 - 37 U/L    ALT 12  0 - 53 U/L    Alkaline Phosphatase 51  39 - 117 U/L    Total Bilirubin 0.3  0.3 - 1.2 mg/dL    GFR calc non Af Amer 59 (*) >90 mL/min    GFR calc Af Amer 68 (*) >90 mL/min   PROTIME-INR     Status: Normal   Collection Time   10/16/12  4:47 PM      Component Value Range Comment   Prothrombin Time 13.0  11.6 - 15.2 seconds    INR 0.99  0.00 - 1.49   PREPARE PLATELET PHERESIS     Status: Normal (Preliminary result)   Collection Time   10/16/12  5:30 PM      Component Value  Range Comment   Unit Number [id number]      Blood Component Type PLTPHER LR1      Unit division 00      Status of Unit ISSUED      Transfusion Status OK TO TRANSFUSE     MRSA PCR SCREENING     Status: Normal   Collection Time   10/16/12  8:19 PM      Component Value Range Comment   MRSA by PCR NEGATIVE  NEGATIVE   PLATELET FUNCTION ASSAY     Status: Normal   Collection Time   10/16/12  8:30 PM      Component Value Range Comment   Collagen / Epinephrine 104  0 - 184 seconds    PFA Interpretation (NOTE)      Ct Head Wo Contrast  10/16/2012  *RADIOLOGY REPORT*  Clinical Data:  Trauma.  The patient is on Plavix.  CT HEAD WITHOUT CONTRAST CT CERVICAL SPINE WITHOUT CONTRAST  Technique:  Multidetector CT imaging of the head and cervical spine was performed following the standard protocol without intravenous contrast.  Multiplanar CT image reconstructions of the cervical spine were also generated.  Comparison:  CT head without contrast 12/21/2009.  CT HEAD  Findings: A high posterior left frontal lobe hemorrhage measures 2.3 x 3.6 x 3.5 cm.  There is surrounding edema with effacement of sulci.  No midline shift is  evident.  No extra-axial or interventricular hemorrhage is present.  There is a focal scalp hematoma over the left parietal scalp without an underlying fracture.  This atrophy white matter disease is otherwise stable. No acute cortical infarct is present.  No significant extra-axial fluid collection is present.  The paranasal sinuses and mastoid air cells are clear. Atherosclerotic calcifications are noted within the cavernous carotid arteries.  The osseous skull is intact.  IMPRESSION:  1.  2.3 x 3.6 x 3.5 cm high posterior left frontal lobe parenchymal hemorrhage without interventricular or extra-axial extension. 2.  Surrounding edema and local mass effect without significant midline shift. 3.  Stable atrophy and white matter disease otherwise. 4.  Atherosclerosis.  CT CERVICAL SPINE   Findings: The cervical spine is imaged from skull base through T1- 2.  Chronic endplate sclerotic changes are present at C6-7. Osseous foraminal narrowing is present on the left at C4-5.  No acute fracture or traumatic subluxation is evident.  The soft tissues are unremarkable.  Biapical pleural parenchymal scarring is stable.  IMPRESSION:  1.  Stable spondylosis of the cervical spine. 2.  No acute fracture or traumatic subluxation.  Critical Value/emergent results were called by telephone at the time of interpretation on 10/16/2012 at 5 o'clock p.m. to Dr. Juleen China, who verbally acknowledged these results.   Original Report Authenticated By: Marin Roberts, M.D.    Ct Cervical Spine Wo Contrast  10/16/2012  *RADIOLOGY REPORT*  Clinical Data:  Trauma.  The patient is on Plavix.  CT HEAD WITHOUT CONTRAST CT CERVICAL SPINE WITHOUT CONTRAST  Technique:  Multidetector CT imaging of the head and cervical spine was performed following the standard protocol without intravenous contrast.  Multiplanar CT image reconstructions of the cervical spine were also generated.  Comparison:  CT head without contrast 12/21/2009.  CT HEAD  Findings: A high posterior left frontal lobe hemorrhage measures 2.3 x 3.6 x 3.5 cm.  There is surrounding edema with effacement of sulci.  No midline shift is evident.  No extra-axial or interventricular hemorrhage is present.  There is a focal scalp hematoma over the left parietal scalp without an underlying fracture.  This atrophy white matter disease is otherwise stable. No acute cortical infarct is present.  No significant extra-axial fluid collection is present.  The paranasal sinuses and mastoid air cells are clear. Atherosclerotic calcifications are noted within the cavernous carotid arteries.  The osseous skull is intact.  IMPRESSION:  1.  2.3 x 3.6 x 3.5 cm high posterior left frontal lobe parenchymal hemorrhage without interventricular or extra-axial extension. 2.  Surrounding edema  and local mass effect without significant midline shift. 3.  Stable atrophy and white matter disease otherwise. 4.  Atherosclerosis.  CT CERVICAL SPINE  Findings: The cervical spine is imaged from skull base through T1- 2.  Chronic endplate sclerotic changes are present at C6-7. Osseous foraminal narrowing is present on the left at C4-5.  No acute fracture or traumatic subluxation is evident.  The soft tissues are unremarkable.  Biapical pleural parenchymal scarring is stable.  IMPRESSION:  1.  Stable spondylosis of the cervical spine. 2.  No acute fracture or traumatic subluxation.  Critical Value/emergent results were called by telephone at the time of interpretation on 10/16/2012 at 5 o'clock p.m. to Dr. Juleen China, who verbally acknowledged these results.   Original Report Authenticated By: Marin Roberts, M.D.    Dg Chest Portable 1 View  10/16/2012  *RADIOLOGY REPORT*  Clinical Data: Trauma.  PORTABLE CHEST - 1 VIEW  Comparison: 12/21/2009  Findings: Midline trachea.  Normal heart size.  No pleural effusion or pneumothorax.  Mild apical lordotic patient positioning. Clear lungs.  IMPRESSION: No acute cardiopulmonary disease.  No acute or post-traumatic deformity identified.   Original Report Authenticated By: Jeronimo Greaves, M.D.     Review of Systems  Unable to perform ROS: mental status change    Blood pressure 151/63, pulse 104, temperature 98.1 F (36.7 C), temperature source Oral, resp. rate 17, height 5\' 5"  (1.651 m), SpO2 99.00%. Physical Exam  Constitutional: He appears well-developed and well-nourished. He appears distressed.  HENT:       Occipital laceration on left. Dried blood on head  Eyes: Conjunctivae normal and EOM are normal. Pupils are equal, round, and reactive to light.  Respiratory: Effort normal and breath sounds normal.  GI: Soft. Bowel sounds are normal.  Neurological: He is alert.       Confused Expressive and receptive aphasia Garbled non intelligible speech Not  able to follow commands Attends Not moving right upper extremity spontaneously, will move right lower extremity at times right facial droop  Skin: Skin is warm and dry.    .  Assessment/Plan Admit to neuro ICU.  Repeat head CT in am, platelets being transfused  Cannot explain dense aphasia on basis on hematoma seen on CT. May be result of head injury, except blood is in very strange location for a traumatic injury since there is no scalp injury, nor skull fracture overlying this area. This may possibly be a tumor which has bled given its location, may also be amyloid angiopathy. Stable at this time.   Kinga Cassar L 10/17/2012, 12:11 AM

## 2012-10-17 NOTE — Clinical Documentation Improvement (Addendum)
GENERIC DOCUMENTATION CLARIFICATION QUERY  THIS DOCUMENT IS NOT A PERMANENT PART OF THE MEDICAL RECORD  TO RESPOND TO THE THIS QUERY, FOLLOW THE INSTRUCTIONS BELOW:  1. If needed, update documentation for the patient's encounter via the notes activity.  2. Access this query again and click edit on the In [employer].  3. After updating, or not, click F2 to complete all highlighted (required) fields concerning your review. Select "additional documentation in the medical record" OR "no additional documentation provided".  4. Click Sign note button.  5. The deficiency will fall out of your In Basket *Please let us know if you are not able to complete this workflow by phone or e-mail (listed below).  Please update your documentation within the medical record to reflect your response to this query.                                                                                        10/17/12   Dear Dr. Franky Macho   / Associates,  In a better effort to capture your patient's severity of illness/SOI, risk of mortality/ROM, reflect appropriate length of stay and utilization of resources, a review of the patient medical record has revealed the following indicators.   PER ED NOTE CT REVEALED:"left frontal IPH with surrounding edema". IF YOU AGREE PLEASE DOCUMENT IN NOTES AND DC SUMMARY TO CLARIFY PATIENT'S SEVERITY OF ILLNESS.  THANK YOU.  Possible Clinical Conditions? - Cerebral edema - vasogenic edema - Other Condition (please specify)   Supporting Information: - Risk Factors:  level II trauma: mechanical fall striking back of head on ground, anticoagulated w/ASA and Plavix prior to fall, Intracerebral hemorrhage/hematoma left frontal - Signs & Symptoms: Per ED Resident:"traumatic left frontal IPH with surrounding edema", "right sided weakness, with worsening mental status", "Acute blood loss anemia" - Diagnostics: Ct 12/2:"surrounding edema with effacement of sulci" - Treatment: ICU,  platelets   You may use possible, probable, or suspect with inpatient documentation. possible, probable, suspected diagnoses MUST be documented at the time of discharge  Reviewed:  no additional documentation provided  Thank You,  Beverley Fiedler RN BSN Clinical Documentation Specialist: Tele:  [phone] Health Information Management [place] He does not have any appreciable edema. He has an intracerebral hematoma.

## 2012-10-17 NOTE — Evaluation (Signed)
Clinical/Bedside Swallow Evaluation Patient Details  Name: John Montgomery MRN: [id number] Date of Birth: Sep 19, 1921  Today's Date: 10/17/2012 Time: 1015-1030 SLP Time Calculation (min): 15 min  Past Medical History:  Past Medical History  Diagnosis Date  . Hypertension   . Hyperlipidemia   . Myocardial infarction    Past Surgical History:  Past Surgical History  Procedure Date  . Angioplasty    HPI:  [name] while outside his home walking was presumed to have knocked over by a neighbors dog. He was seen walking by his son when a school bus stopped and the driver seemed to be asking questions. When he kept walking his son noticed blood about his face and head. He was alert but not quite right according to his son. According to his son his speech started to deteriorate soon after. He was not making sense and could not speak normally. He remained alert, but was not using his right side normally, At [facility] ct revealed a hematoma in the left frontal lobe very near the motor area. No midline shift, basal cisterns widely patent, general cerebral atrophy.    Assessment / Plan / Recommendation Clinical Impression  Pt presents with normal sensorimotor function for swallowing with timely swallow trigger and no symptoms of aspiration.  There are deficits in object/function recognition, apraxic behaviors in response to cup/utensil presentation.  In light of these deficits, pt willl need assist with feeding; he is safe to begin a regular consistency diet with thin liquids.           Diet Recommendation Regular;Thin liquid   Liquid Administration via: Cup Medication Administration: Whole meds with liquid Supervision:  (assist with feeding)    Other  Recommendations Oral Care Recommendations: Oral care BID   Follow Up Recommendations  None (for swallowing)    Frequency and Duration   n/a     Pertinent Vitals/Pain Unable to verbalize     John Montgomery L. John Montgomery, [place] CCC/SLP Pager  [phone]   John Montgomery 10/17/2012,11:06 AM

## 2012-10-17 NOTE — Progress Notes (Signed)
PT Cancellation Note  Patient Details Name: John Montgomery MRN: [id number] DOB: 1920/12/10   Cancelled Treatment:    Reason Eval/Treat Not Completed: Patient not medically ready (Pt currently on bedrest.)  Please update activity orders when appropriate.  Thanks!!   Nusrat Encarnacion 10/17/2012, 2:37 PM [name], PT DPT [phone]

## 2012-10-18 ENCOUNTER — Other Ambulatory Visit: Payer: Self-pay

## 2012-10-18 ENCOUNTER — Encounter (HOSPITAL_COMMUNITY): Payer: Self-pay | Admitting: Radiology

## 2012-10-18 ENCOUNTER — Inpatient Hospital Stay (HOSPITAL_COMMUNITY): Payer: Medicare Other

## 2012-10-18 LAB — TROPONIN I: Troponin I: <0.3 ng/mL (ref <0.30)

## 2012-10-18 LAB — BASIC METABOLIC PANEL
Calcium: 8.7 mg/dL (ref 8.4–10.5)
GFR calc non Af Amer: 72 mL/min — ABNORMAL LOW (ref >90)
Glucose, Bld: 97 mg/dL (ref 70–99)
Sodium: 133 mEq/L — ABNORMAL LOW (ref 135–145)

## 2012-10-18 LAB — MAGNESIUM: Magnesium: 2.1 mg/dL (ref 1.5–2.5)

## 2012-10-18 NOTE — Evaluation (Signed)
Occupational Therapy Evaluation Patient Details Name: EDGER HUSAIN MRN: [id number] DOB: 1921/04/12 Today's Date: 10/18/2012 Time: 1478-2956 OT Time Calculation (min): 41 min  OT Assessment / Plan / Recommendation Clinical Impression  Pt admitted with left frontal hematoma and occipital laceration after being knocked over by a dog during his daily walk. Pt presents with right hemiplegia and cognitive deficits which impact pt's safety and independence with ADLs. Pt will benefit from skilled OT in the acute setting to maximize I with ADL and ADL mobility prior to d/c. Recommend CIR for d/c plan- pt is very motivated to return to PLOF.    OT Assessment  Patient needs continued OT Services    Follow Up Recommendations  CIR    Barriers to Discharge      Equipment Recommendations   (TBD)    Recommendations for Other Services Rehab consult  Frequency  Min 2X/week    Precautions / Restrictions Precautions Precautions: Fall Precaution Comments: strong left lateral lean Restrictions Weight Bearing Restrictions: No   Pertinent Vitals/Pain Pt denies any pain during session    ADL  Grooming: Moderate assistance;Wash/dry face;Wash/dry hands Where Assessed - Grooming: Supported sitting Lower Body Bathing: +2 Total assistance Lower Body Bathing: Patient Percentage: 10% Where Assessed - Lower Body Bathing: Supported sit to stand Upper Body Dressing: Maximal assistance Where Assessed - Upper Body Dressing: Supported sitting Lower Body Dressing: +2 Total assistance Lower Body Dressing: Patient Percentage: 10% Where Assessed - Lower Body Dressing: Supported sit to Pharmacist, hospital: +2 Total assistance Toilet Transfer: Patient Percentage: 30% (pt resisting/pushing to the right) Toilet Transfer Method: Stand pivot Acupuncturist:  (bed to chair) Toileting - Clothing Manipulation and Hygiene: +2 Total assistance Toileting - Clothing Manipulation and Hygiene: Patient  Percentage: 10% Where Assessed - Glass blower/designer Manipulation and Hygiene: Standing Equipment Used: Gait belt Transfers/Ambulation Related to ADLs: +2total(pt=50%); +2 total A(pt =30%) for stand pivot from EOB to chair, assist to advance each LE as well as weight shift as pt resisting/with strong right lateral lean.     OT Diagnosis: Generalized weakness;Cognitive deficits;Disturbance of vision;Hemiplegia dominant side  OT Problem List: Decreased strength;Impaired balance (sitting and/or standing);Decreased activity tolerance;Decreased knowledge of use of DME or AE;Decreased coordination;Impaired vision/perception;Decreased cognition;Decreased safety awareness;Decreased knowledge of precautions;Impaired sensation;Impaired tone;Impaired UE functional use OT Treatment Interventions: Self-care/ADL training;DME and/or AE instruction;Therapeutic activities;Visual/perceptual remediation/compensation;Cognitive remediation/compensation;Patient/family education;Balance training   OT Goals Acute Rehab OT Goals OT Goal Formulation: With patient/family Time For Goal Achievement: 11/01/12 Potential to Achieve Goals: Good ADL Goals Pt Will Perform Eating: with set-up;Sitting, chair ADL Goal: Eating - Progress: Goal set today Pt Will Perform Grooming: with supervision;with set-up;Sitting at sink ADL Goal: Grooming - Progress: Goal set today Pt Will Perform Upper Body Dressing: with set-up;with supervision;Sitting, bed;Sitting, chair ADL Goal: Upper Body Dressing - Progress: Goal set today Pt Will Perform Lower Body Dressing: with min assist;Sit to stand from bed;Sit to stand from chair;Supported ADL Goal: Lower Body Dressing - Progress: Goal set today Pt Will Transfer to Toilet: with mod assist;Stand pivot transfer;with DME ADL Goal: Toilet Transfer - Progress: Goal set today Pt Will Perform Toileting - Clothing Manipulation: with min assist;Standing ADL Goal: Toileting - Clothing Manipulation -  Progress: Goal set today Additional ADL Goal #1: Pt will perform dynamic sitting balance activities greater than with Min A in prep for seated ADLs. ADL Goal: Additional Goal #1 - Progress: Goal set today  Visit Information  Last OT Received On: 10/18/12 Assistance Needed: +2  Subjective Data  Subjective: Will they take somone my age? (referring to CIR) Patient Stated Goal: Return home   Prior Functioning     Home Living Lives With: Family (2 sons work during the day; family can arrange 24hr) Available Help at Discharge: Family Type of Home: House Home Access: Ramped entrance (back of the house) Home Layout: One level Bathroom Shower/Tub: Child psychotherapist: Standard Home Adaptive Equipment: None Prior Function Level of Independence: Independent Able to Take Stairs?: Yes Driving: No Vocation: Other (comment) (("very sharp" per daughter, active, walks daily, social)) Communication Communication: Expressive difficulties Dominant Hand: Right         Vision/Perception Vision - Assessment Vision Assessment: Vision impaired - to be further tested in functional context Additional Comments: Pt denies double/blurry vision. Unable to complete vision testing secondary to inattention/difficulty following directions. pt unable to track stimulus or to dissociate head movement from eye movement the few times he attempted tracking with eyes. pt holds head slightly tilted to the right and states this is not baseline.   Cognition  Overall Cognitive Status: Impaired Area of Impairment: Attention;Following commands;Awareness of deficits;Problem solving Arousal/Alertness: Awake/alert Orientation Level: Appears intact for tasks assessed Behavior During Session: [facility] for tasks performed Current Attention Level: Sustained (~5sec, at most 10) Following Commands: Follows one step commands consistently Awareness of Deficits: Pt stated "I thought I could walk to  that chair.  I guess I was wrong."  (pt assumed he would be able to ambulate without movement in right LE however able to recgonize after attempting ambulation pt is unable.) Problem Solving: Slow process at times Cognition - Other Comments: pt unable to attend during vision testing    Extremity/Trunk Assessment Right Upper Extremity Assessment RUE ROM/Strength/Tone: Deficits RUE ROM/Strength/Tone Deficits: flaccid RUE- no movement/muscle activation noted (including shoulder elevation) RUE Sensation: Deficits RUE Sensation Deficits: reports feeling "like it's asleep" shoulder and distally RUE Coordination: Deficits RUE Coordination Deficits: flaccid Right Lower Extremity Assessment RLE ROM/Strength/Tone: Deficits RLE ROM/Strength/Tone Deficits: Flaccid on right LE RLE Sensation: Deficits RLE Sensation Deficits: Impaired light touch.  Pt stated "It feels a little numb." Left Lower Extremity Assessment LLE ROM/Strength/Tone: Within functional levels LLE Sensation: WFL - Light Touch     Mobility Bed Mobility Bed Mobility: Supine to Sit;Sitting - Scoot to Edge of Bed Supine to Sit: 3: Mod assist;HOB flat;With rails Sitting - Scoot to Edge of Bed: 2: Max assist Details for Bed Mobility Assistance: (A) to elevate trunk OOB and advance right LE OOB.  Cues for technique.  Pt needed extra time to complete task. Transfers Sit to Stand: 1: +2 Total assist;From bed Sit to Stand: Patient Percentage: 50% Stand to Sit: 1: +2 Total assist;To bed Stand to Sit: Patient Percentage: 50% Details for Transfer Assistance: +2total(pt=50%); +2 total A(pt =30%) for stand pivot from EOB to chair, assist to advance each LE as well as weight shift as pt resisting/with strong right lateral lean.      Shoulder Instructions     Exercise     Balance Balance Balance Assessed: Yes Static Sitting Balance Static Sitting - Balance Support: Feet supported Static Sitting - Level of Assistance: 2: Max assist;4: Min  assist Static Sitting - Comment/# of Minutes: ~5 minutes prior to transfer with (A) to maintain balance with right sided lean and occasional pushing to right side.  Cues to keep left hand in front in supine position to prevent pushing to right side.  With VCs, manual and tactile cues pt able to maintain  midline for ~10 seconds at a time with constant cues. Static Standing Balance Static Standing - Balance Support: Bilateral upper extremity supported Static Standing - Level of Assistance: 1: +2 Total assist Static Standing - Comment/# of Minutes: ~2 minutes prior to transfer to recliner with max manual and VCs to maintain balance and promote midline.  Pt continues to push towards right side in standing.  (A) to prevent right knee buckling.   End of Session OT - End of Session Equipment Utilized During Treatment: Gait belt Activity Tolerance: Patient tolerated treatment well Patient left: in chair;with call bell/phone within reach Nurse Communication: Mobility status  GO     Joyell Emami 10/18/2012, 3:08 PM

## 2012-10-18 NOTE — Progress Notes (Signed)
Speech Language Pathology Treatment Patient Details Name: John Montgomery MRN: [id number] DOB: 12/06/1920 Today's Date: 10/18/2012 Time: 1478-2956 SLP Time Calculation (min): 17 min  Assessment / Plan / Recommendation Clinical Impression  Treatment focused on SLP facilitation of cognitive recovery and family education. SLP facilitated basic conversation and adjusted environment via decreased distractions to gain improved sustained attention from initial evaluation 12/5. Patient required min-mod verbal and tactile cueing to redirect to basic conversation. Oriented to person, place, time, and situation. Appropriateness of verbal output intact today. Patient continues to present with decreased intellectual awareness of cognitive deficits s/p head injury increasing potential to decreased safety with ADLs. Famly education complete with son and daugther regarding continued need to decrease environmental distractions, limit visitors, and reorient as needed to increase functionality of communication and efficiency with ADLs. SLP will continue to f/u. Recommend PT/OT for evaluations prior to discharge.      SLP Plan  Continue with current plan of care    Pertinent Vitals/Pain n/a  SLP Goals  SLP Goals Potential to Achieve Goals: Good Progress/Goals/Alternative treatment plan discussed with pt/caregiver and they: Agree SLP Goal #1: Pt will focus/sustain attention to functional task for 10-15 seconds with max assist SLP Goal #1 - Progress: Progressing toward goal SLP Goal #2: Pt will follow one-step commands in functional context with max assist. SLP Goal #2 - Progress: Progressing toward goal SLP Goal #3: Pt will state name and biographical information with verbal/tactile cues and max assist. SLP Goal #3 - Progress: Progressing toward goal  General Behavior/Cognition: Alert;Pleasant mood Patient Positioning: Upright in bed  Oral Cavity - Oral Hygiene Does patient have any of the following "at  risk" factors?: None of the above Brush patient's teeth BID with toothbrush (using toothpaste with fluoride): Yes   Treatment Treatment focused on: Cognition;Patient/family/caregiver education Family/Caregiver Educated: son and daughter Skilled Treatment: Treatment focused on SLP facilitation of cognitive recovery and family education. SLP facilitated basic conversation and adjusted environment via decreased distractions to gain improved sustained attention from initial evaluation 12/5. Patient required min-mod verbal and tactile cueing to redirect to basic conversation. Oriented to person, place, time, and situation. Appropriateness of verbal output intact today. Patient continues to present with decreased intellectual awareness of cognitive deficits s/p head injury increasing potential to decreased safety with ADLs. Famly education complete with son and daugther regarding continued need to decrease environmental distractions, limit visitors, and reorient as needed to increase functionality of communication and efficiency with ADLs. SLP will continue to f/u. Recommend PT/OT for evaluations prior to discharge.     GO   John Lango MA, CCC-SLP [phone]   John Montgomery 10/18/2012, 10:50 AM

## 2012-10-18 NOTE — Progress Notes (Signed)
Patient ID: John Montgomery, male   DOB: 05/15/1921, 78 y.o.   MRN: [id number] BP 144/50  Pulse 83  Temp 98.5 F (36.9 C) (Oral)  Resp 22  Ht 5\' 5"  (1.651 m)  Wt 55.1 kg (121 lb 7.6 oz)  BMI 20.21 kg/m2  SpO2 99% Alert and oriented to person, year Speech is fluent, he does stutter. Following commands Moves left side well, some movement in right lower extremity Head ct shows slight enlargement of the hematoma, no real change. Minimal mass effect, no midline shift.

## 2012-10-18 NOTE — Progress Notes (Signed)
Chaplain met pt and family while rounding in 3100. Pt sitting in chair and quite fluent; seems to like to talk. Pt seems in remarkably good condition considering his fall just two days ago. Family very pleased with his progress and attributes that to prayer. Pt asked me to come back again.

## 2012-10-18 NOTE — Progress Notes (Signed)
UR completed 

## 2012-10-18 NOTE — Evaluation (Addendum)
Physical Therapy Evaluation Patient Details Name: John Montgomery MRN: [id number] DOB: November 16, 1920 Today's Date: 10/18/2012 Time: 4098-1191 PT Time Calculation (min): 36 min  PT Assessment / Plan / Recommendation Clinical Impression  Pt is 78 y/o male admitted due to fall sustaining left  frontal - parietal lobe  hematoma .  Pt was pushed down by neighborhood dog on pt's daily walk.  Pt veny motivated and willing to work.  Pt will benefit from acute PT services to improve overall mobility, balance, strength, gait to prepare to eventually return home with family.  Highly recommend inpatient rehab at d/c.    PT Assessment  Patient needs continued PT services    Follow Up Recommendations  CIR    Does the patient have the potential to tolerate intense rehabilitation      Barriers to Discharge None      Equipment Recommendations   (TBD)    Recommendations for Other Services Rehab consult   Frequency Min 4X/week    Precautions / Restrictions Precautions Precautions: Fall Precaution Comments: strong left lateral lean Restrictions Weight Bearing Restrictions: No   Pertinent Vitals/Pain No c/o pain      Mobility  Bed Mobility Bed Mobility: Supine to Sit;Sitting - Scoot to Edge of Bed Supine to Sit: 3: Mod assist;HOB flat;With rails Sitting - Scoot to Edge of Bed: 2: Max assist Details for Bed Mobility Assistance: (A) to elevate trunk OOB and advance right LE OOB.  Cues for technique.  Pt needed extra time to complete task. Transfers Transfers: Sit to Stand;Stand to Sit;Stand Pivot Transfers Sit to Stand: 1: +2 Total assist;From bed Sit to Stand: Patient Percentage: 50% Stand to Sit: 1: +2 Total assist;To bed Stand to Sit: Patient Percentage: 50% Stand Pivot Transfers: 1: +2 Total assist Stand Pivot Transfers: Patient Percentage: 30% Details for Transfer Assistance: +2total(pt=50%); +2 total A(pt =30%) for stand pivot from EOB to chair, assist to advance each LE as well as  weight shift as pt resisting/with strong right lateral lean.  Ambulation/Gait Ambulation/Gait Assistance: Not tested (comment) Modified Rankin (Stroke Patients Only) Pre-Morbid Rankin Score: No symptoms Modified Rankin: Severe disability    Shoulder Instructions     Exercises     PT Diagnosis: Difficulty walking;Abnormality of gait;Generalized weakness;Hemiplegia dominant side  PT Problem List: Decreased strength;Decreased balance;Decreased activity tolerance;Decreased mobility;Decreased knowledge of use of DME;Decreased coordination;Decreased safety awareness;Impaired sensation PT Treatment Interventions: DME instruction;Gait training;Therapeutic exercise;Functional mobility training;Therapeutic activities;Balance training;Neuromuscular re-education;Cognitive remediation;Patient/family education   PT Goals Acute Rehab PT Goals PT Goal Formulation: With patient/family Time For Goal Achievement: 10/25/12 Potential to Achieve Goals: Good Pt will go Supine/Side to Sit: with supervision PT Goal: Supine/Side to Sit - Progress: Goal set today Pt will Sit at Edge of Bed: with supervision;1-2 min PT Goal: Sit at Edge Of Bed - Progress: Goal set today Pt will go Sit to Supine/Side: with min assist PT Goal: Sit to Supine/Side - Progress: Goal set today Pt will go Sit to Stand: with mod assist PT Goal: Sit to Stand - Progress: Goal set today Pt will go Stand to Sit: with mod assist PT Goal: Stand to Sit - Progress: Goal set today Pt will Transfer Bed to Chair/Chair to Bed: with mod assist PT Transfer Goal: Bed to Chair/Chair to Bed - Progress: Goal set today Pt will Stand: with mod assist;1 - 2 min PT Goal: Stand - Progress: Goal set today  Visit Information  Last PT Received On: 10/18/12 Assistance Needed: +2 PT/OT Co-Evaluation/Treatment: Yes  Subjective Data  Subjective: "I could do a lot more before all this.  I thought I would be able to walk." Patient Stated Goal: To return  home   Prior Functioning  Home Living Lives With: Family (2 sons work during the day; family can arrange 24hr) Available Help at Discharge: Family Type of Home: House Home Access: Ramped entrance (back of the house) Home Layout: One level Bathroom Shower/Tub: Child psychotherapist: Standard Home Adaptive Equipment: None Prior Function Level of Independence: Independent Able to Take Stairs?: Yes Driving: No Vocation: Other (comment) (("very sharp" per daughter, active, walks daily, social)) Communication Communication: Expressive difficulties Dominant Hand: Right    Cognition  Overall Cognitive Status: Impaired Area of Impairment: Attention;Following commands;Awareness of deficits;Problem solving Arousal/Alertness: Awake/alert Orientation Level: Appears intact for tasks assessed Behavior During Session: [facility] for tasks performed Current Attention Level: Sustained (~5sec, at most 10) Following Commands: Follows one step commands consistently Awareness of Deficits: Pt stated "I thought I could walk to that chair.  I guess I was wrong."  (pt assumed he would be able to ambulate without movement in right LE however able to recgonize after attempting ambulation pt is unable.) Problem Solving: Slow process at times Cognition - Other Comments: pt unable to attend during vision testing    Extremity/Trunk Assessment Right Upper Extremity Assessment RUE ROM/Strength/Tone: Deficits RUE ROM/Strength/Tone Deficits: flaccid RUE- no movement/muscle activation noted (including shoulder elevation) RUE Sensation: Deficits RUE Sensation Deficits: reports feeling "like it's asleep" shoulder and distally RUE Coordination: Deficits RUE Coordination Deficits: flaccid Right Lower Extremity Assessment RLE ROM/Strength/Tone: Deficits RLE ROM/Strength/Tone Deficits: Flaccid on right LE RLE Sensation: Deficits RLE Sensation Deficits: Impaired light touch.  Pt stated "It feels a little  numb." Left Lower Extremity Assessment LLE ROM/Strength/Tone: Within functional levels LLE Sensation: WFL - Light Touch   Balance Balance Balance Assessed: Yes Static Sitting Balance Static Sitting - Balance Support: Feet supported Static Sitting - Level of Assistance: 2: Max assist;4: Min assist Static Sitting - Comment/# of Minutes: ~5 minutes prior to transfer with (A) to maintain balance with right sided lean and occasional pushing to right side.  Cues to keep left hand in front in supine position to prevent pushing to right side.  With VCs, manual and tactile cues pt able to maintain midline for ~10 seconds at a time with constant cues. Static Standing Balance Static Standing - Balance Support: Bilateral upper extremity supported Static Standing - Level of Assistance: 1: +2 Total assist Static Standing - Comment/# of Minutes: ~2 minutes prior to transfer to recliner with max manual and VCs to maintain balance and promote midline.  Pt continues to push towards right side in standing.  (A) to prevent right knee buckling.  End of Session PT - End of Session Equipment Utilized During Treatment: Gait belt Activity Tolerance: Patient tolerated treatment well Patient left: in chair;with call bell/phone within reach;with family/visitor present Nurse Communication: Mobility status  GP     Brice Potteiger 10/18/2012, 3:04 PM [name], PT DPT [phone]

## 2012-10-18 NOTE — Progress Notes (Signed)
Rehab Admissions Coordinator Note:  Patient was screened by Brock Ra for appropriateness for an Inpatient Acute Rehab Consult.  At this time, we are recommending Inpatient Rehab consult.  Brock Ra 10/18/2012, 3:17 PM  I can be reached at [phone].

## 2012-10-18 NOTE — Consult Note (Addendum)
I was called for a non-urgent consultation for what sounds like new onset of Atrial Fibrillation that is rate controlled. The patient appears to be tolerating the rhythm and is hemodynamically stable.  The on-call Neurosurgeon has requested an AM consultation.  Provided the patient remains stable, would not make any medication adjustments for now.  If he were to go into Rapid ventricular response - would initiate Diltiazem drip with a 10mg  bolus follwed by a rate of at least 5mg /hr.  Please do not hesitate to call with questions.  We will be happy to see the patient in the AM - or sooner if he were to destabilize.  Marykay Lex, M.D., M.S. [facility] [address] [place], [place]  [address]  [phone] Pager # [phone] 10/18/2012 9:12 PM

## 2012-10-19 DIAGNOSIS — I480 Paroxysmal atrial fibrillation: Secondary | ICD-10-CM | POA: Diagnosis not present

## 2012-10-19 DIAGNOSIS — I2510 Atherosclerotic heart disease of native coronary artery without angina pectoris: Secondary | ICD-10-CM | POA: Diagnosis present

## 2012-10-19 DIAGNOSIS — E785 Hyperlipidemia, unspecified: Secondary | ICD-10-CM | POA: Diagnosis present

## 2012-10-19 DIAGNOSIS — W19XXXA Unspecified fall, initial encounter: Secondary | ICD-10-CM

## 2012-10-19 DIAGNOSIS — S065X9A Traumatic subdural hemorrhage with loss of consciousness of unspecified duration, initial encounter: Secondary | ICD-10-CM | POA: Diagnosis present

## 2012-10-19 DIAGNOSIS — I5189 Other ill-defined heart diseases: Secondary | ICD-10-CM | POA: Diagnosis present

## 2012-10-19 DIAGNOSIS — I10 Essential (primary) hypertension: Secondary | ICD-10-CM | POA: Diagnosis present

## 2012-10-19 DIAGNOSIS — I739 Peripheral vascular disease, unspecified: Secondary | ICD-10-CM | POA: Diagnosis present

## 2012-10-19 DIAGNOSIS — S069X9A Unspecified intracranial injury with loss of consciousness of unspecified duration, initial encounter: Secondary | ICD-10-CM

## 2012-10-19 DIAGNOSIS — I440 Atrioventricular block, first degree: Secondary | ICD-10-CM | POA: Diagnosis not present

## 2012-10-19 LAB — CALCIUM, IONIZED: Calcium, Ion: 1.22 mmol/L (ref 1.12–1.32)

## 2012-10-19 MED ORDER — PANTOPRAZOLE SODIUM 40 MG PO TBEC
40.0000 mg | DELAYED_RELEASE_TABLET | Freq: Every day | ORAL | Status: DC
  Administered 2012-10-19: 40 mg via ORAL
  Filled 2012-10-19: qty 1

## 2012-10-19 MED ORDER — METOPROLOL TARTRATE 12.5 MG HALF TABLET
12.5000 mg | ORAL_TABLET | Freq: Two times a day (BID) | ORAL | Status: DC
  Administered 2012-10-19 – 2012-10-20 (×2): 12.5 mg via ORAL
  Filled 2012-10-19 (×3): qty 1

## 2012-10-19 MED ORDER — METOPROLOL TARTRATE 25 MG PO TABS
25.0000 mg | ORAL_TABLET | Freq: Once | ORAL | Status: AC
  Administered 2012-10-19: 25 mg via ORAL
  Filled 2012-10-19: qty 1

## 2012-10-19 MED ORDER — SIMVASTATIN 40 MG PO TABS
40.0000 mg | ORAL_TABLET | Freq: Every day | ORAL | Status: DC
  Administered 2012-10-19: 40 mg via ORAL
  Filled 2012-10-19 (×2): qty 1

## 2012-10-19 NOTE — Progress Notes (Signed)
Reason for Consult: PAF  Requesting Physician: Dr Franky Macho  HPI:  The patient is a very pleasant 78 year old thin and frail-appearing widowed Caucasian male, father of 3, grandfather to 8 grandchildren, who last saw Dr Allyson Sabal in the office 6 months ago. His wife died approximately 10 years ago. He lives alone. He has a history of CAD status post RCA PTCA in '98 and RCA DES by Dr. Lavonne Chick October of 2006.  His other problems include hypertension, hyperlipidemia, and moderate carotid disease, which we are following by duplex ultrasound semiannually, right greater than left. He is neurologically asymptomatic. His last Myoview, performed August 31, 2010, was low risk. He has been  totally asymptomatic from a cardiac standpoint. He has not had problems with PAF. He has been a little bradycardic chronically and has been on low dose beta blocker. He was admitted 10/16/12 with a SDH with resultant Rt UE weakness. He has had PAF, and flutter with CVR. He is now in NSR/ST. He is on none of his home medications.     PMHx:  Past Medical History  Diagnosis Date  . Hypertension   . Hyperlipidemia   . Myocardial infarction    Past Surgical History  Procedure Date  . Angioplasty     FAMHx: History reviewed. No pertinent family history.  SOCHx:  reports that he has never smoked. He has never used smokeless tobacco. He reports that he does not drink alcohol or use illicit drugs.  ALLERGIES: Allergies  Allergen Reactions  . Penicillins     hives    ROS: Pertinent items are noted in HPI. no angina, no history of papitations or tachycardia.  HOME MEDICATIONS: Prescriptions prior to admission  Medication Sig Dispense Refill  . amLODipine (NORVASC) 5 MG tablet Take 5 mg by mouth daily.      Marland Kitchen aspirin 81 MG tablet Take 81 mg by mouth at bedtime.       Marland Kitchen CALCIUM-VITAMIN D PO Take 1 tablet by mouth daily.      . Cholecalciferol (VITAMIN D3) 2000 UNITS TABS Take 1 tablet by mouth daily.      .  clopidogrel (PLAVIX) 75 MG tablet Take 75 mg by mouth daily.      . metoprolol tartrate (LOPRESSOR) 25 MG tablet Take 25 mg by mouth daily.      . Multiple Vitamins-Minerals (EYE VITAMINS PO) Take 1 tablet by mouth daily.      Marland Kitchen omeprazole (PRILOSEC) 20 MG capsule Take 20 mg by mouth daily.      . pravastatin (PRAVACHOL) 80 MG tablet Take 80 mg by mouth at bedtime.       . ramipril (ALTACE) 10 MG capsule Take 10 mg by mouth daily.        HOSPITAL MEDICATIONS: I have reviewed the patient's current medications.    . metoprolol tartrate  12.5 mg Oral BID  . metoprolol tartrate  25 mg Oral Once  . pantoprazole  40 mg Oral QHS  . senna-docusate  1 tablet Oral BID  . simvastatin  40 mg Oral q1800  . [DISCONTINUED] niCARDipine  5 mg/hr Intravenous Once  . [DISCONTINUED] pantoprazole (PROTONIX) IV  40 mg Intravenous QHS   VITALS: Blood pressure 119/59, pulse 110, temperature 98.5 F (36.9 C), temperature source Oral, resp. rate 19, height 5\' 5"  (1.651 m), weight 55.1 kg (121 lb 7.6 oz), SpO2 98.00%.  PHYSICAL EXAM: General appearance: alert, cooperative, no distress and thin, frail Neck: no carotid bruit and no JVD Lungs: clear to  auscultation bilaterally Heart: regular rate and rhythm; 1/6 sem Abdomen: non tender, not distended Extremities: no edema Pulses: 2+ and symmetric Skin: pale, cool, and dry Neurologic: Awake, alert and oriented. He has some movement in his Rt fingers.  LABS: Results for orders placed during the hospital encounter of 10/16/12 (from the past 48 hour(s))  BASIC METABOLIC PANEL     Status: Abnormal   Collection Time   10/18/12  9:03 PM      Component Value Range Comment   Sodium 133 (*) 135 - 145 mEq/L    Potassium 4.0  3.5 - 5.1 mEq/L    Chloride 102  96 - 112 mEq/L    CO2 22  19 - 32 mEq/L    Glucose, Bld 97  70 - 99 mg/dL    BUN 19  6 - 23 mg/dL    Creatinine, Ser 1.61  0.50 - 1.35 mg/dL    Calcium 8.7  8.4 - 09.6 mg/dL    GFR calc non Af Amer 72 (*)  >90 mL/min    GFR calc Af Amer 84 (*) >90 mL/min   MAGNESIUM     Status: Normal   Collection Time   10/18/12  9:03 PM      Component Value Range Comment   Magnesium 2.1  1.5 - 2.5 mg/dL   CK TOTAL AND CKMB     Status: Abnormal   Collection Time   10/18/12  9:03 PM      Component Value Range Comment   Total CK 1027 (*) 7 - 232 U/L    CK, MB 14.2 (*) 0.3 - 4.0 ng/mL    Relative Index 1.4  0.0 - 2.5   TROPONIN I     Status: Normal   Collection Time   10/18/12  9:03 PM      Component Value Range Comment   Troponin I <0.30  <0.30 ng/mL   CALCIUM, IONIZED     Status: Normal   Collection Time   10/18/12  9:04 PM      Component Value Range Comment   Calcium, Ion 1.22  1.12 - 1.32 mmol/L     IMAGING: Ct Head Wo Contrast  10/18/2012  *RADIOLOGY REPORT*  Clinical Data: Hypertension.  Hyperlipidemia.  Intracranial hemorrhage follow-up.  No acute changes.  Fall.  On Plavix.  CT HEAD WITHOUT CONTRAST  Technique:  Contiguous axial images were obtained from the base of the skull through the vertex without contrast.  Comparison: 10/16/2012.  Findings: Motion degraded exam.  Scalp laceration left parietal region without underlying fracture.  Superior left frontal - parietal lobe hemorrhage with surrounding vasogenic edema.  The larger component of the intracranial hemorrhage appears slightly more prominent in size with maximal transverse dimension of 4 x 2.2 cm versus prior 3.6 x 2.2 cm. Slight increase in surrounding vasogenic edema.  Question motion artifact versus small amount of blood adjacent to the anterior falx/crista gali.  Baseline atrophy and small vessel disease type changes without CT evidence of large acute thrombotic infarct.  IMPRESSION: Motion degraded exam.  Suggestion of minimal increase in left frontal - parietal lobe hematoma and surrounding vasogenic edema.  Question motion artifact versus small amount of blood adjacent to the anterior falx/crista gali.  This has been made a PRA call  report utilizing dashboard call feature.   Original Report Authenticated By: Lacy Duverney, M.D.     IMPRESSION: Principal Problem:  *PAF (paroxysmal atrial fibrillation) Active Problems:  SDH (subdural hematoma)  First degree AV block  HTN (hypertension)  CAD, RCA PTCA '98, RCA DES '06. Low risk Nuc 2011  Dyslipidemia  PVD, moderate RICA disease  Diastolic dysfunction, grade 1 with an EF of 50-55% 2011   RECOMMENDATION: He appears to have some degree of SSS. When in NSR as an OP he had a HR of 60. He does have first degree AVB. Will start low dose beta blocker, Prilosec, Pravachol. Will not start Norvasc until OK with Dr Franky Macho as he may want his B/P on the high side. Dr Hazle Coca partner to see. Check TSH with next lab.  Time Spent Directly with Patient:  40 minutes  KILROY,LUKE K 10/19/2012, 9:56 AM    Patient seen and examined. Agree with assessment and plan. PEx as noted above.  No chest pain or sob. Now in sinus rhythm following episode of A flutter last night. Will initiate Lopressor orally now with P 90 - 100 range. No signs of CHF. Now off antiplatelet therapy with recent intracranial hemorrhage. Although CPK elevated, noncardiac with normal relative index.   Lennette Bihari, MD, [facility] 10/19/2012 10:17 AM

## 2012-10-19 NOTE — Progress Notes (Signed)
Physical Therapy Treatment Patient Details Name: John Montgomery MRN: [id number] DOB: 1921-11-01 Today's Date: 10/19/2012 Time: 1150-1220 PT Time Calculation (min): 30 min  PT Assessment / Plan / Recommendation Comments on Treatment Session  Pt very cooperative and willing to work hard.  Pt with mild attention impairment and needs cues for redirections to task.  Pt able to tolerate standing balance activies and began pregait exercises with weight shifts.  Pt overall improvement in sitting balance and standing balance with decrease pushing to right side however continues to lean to right side but aware.    Follow Up Recommendations  CIR     Does the patient have the potential to tolerate intense rehabilitation     Barriers to Discharge        Equipment Recommendations  None recommended by PT    Recommendations for Other Services Rehab consult  Frequency Min 4X/week   Plan Discharge plan remains appropriate;Frequency remains appropriate    Precautions / Restrictions Precautions Precautions: Fall Precaution Comments: strong left lateral lean   Pertinent Vitals/Pain No c/o pain    Mobility  Bed Mobility Bed Mobility: Not assessed Transfers Transfers: Sit to Stand;Stand to Sit Sit to Stand: 1: +2 Total assist;From chair/3-in-1 Sit to Stand: Patient Percentage: 70% Stand to Sit: 1: +2 Total assist;To chair/3-in-1 Stand to Sit: Patient Percentage: 60% Details for Transfer Assistance: +2 (A) to initiate transfer with cues for hand placement and manual cues to maintain balance and prevent right knee buckle.  Pt with heavy lean to right side  Ambulation/Gait Ambulation/Gait Assistance: Not tested (comment) Modified Rankin (Stroke Patients Only) Pre-Morbid Rankin Score: No symptoms Modified Rankin: Severe disability    Exercises     PT Diagnosis:    PT Problem List:   PT Treatment Interventions:     PT Goals Acute Rehab PT Goals PT Goal Formulation: With  patient/family Time For Goal Achievement: 10/25/12 Potential to Achieve Goals: Good Pt will go Sit to Stand: with mod assist PT Goal: Sit to Stand - Progress: Progressing toward goal Pt will go Stand to Sit: with mod assist PT Goal: Stand to Sit - Progress: Progressing toward goal Pt will Transfer Bed to Chair/Chair to Bed: with mod assist PT Transfer Goal: Bed to Chair/Chair to Bed - Progress: Progressing toward goal Pt will Stand: with mod assist;1 - 2 min PT Goal: Stand - Progress: Progressing toward goal  Visit Information  Last PT Received On: 10/19/12 Assistance Needed: +2 PT/OT Co-Evaluation/Treatment: Yes    Subjective Data  Subjective: "I'll keep working on sitting upright." Patient Stated Goal: To return home   Cognition  Overall Cognitive Status: Impaired Area of Impairment: Attention;Following commands;Awareness of deficits;Problem solving Arousal/Alertness: Awake/alert Orientation Level: Appears intact for tasks assessed Behavior During Session: [facility] for tasks performed Current Attention Level: Sustained Following Commands: Follows multi-step commands consistently Problem Solving: Slow process at times Cognition - Other Comments: Pt with improved cognition however continues to be slow to process at times and needs cues for redirection to task at times    Balance  Balance Balance Assessed: Yes Static Sitting Balance Static Sitting - Balance Support: Feet supported Static Sitting - Level of Assistance: 4: Min assist;5: Stand by assistance Static Sitting - Comment/# of Minutes: Occasional min (A) to promote upright posture and find midline.  Pt with right lateral lean and able to self correct at times but occasional min (A). Dynamic Sitting Balance Dynamic Sitting - Balance Support: Feet supported;No upper extremity supported Dynamic Sitting - Level of  Assistance: 5: Stand by assistance;4: Min assist Dynamic Sitting Balance - Compensations: Occasional min (A) with  reaching to right side to prevent LOB to right.  Dynamic Sitting - Balance Activities: Lateral lean/weight shifting;Forward lean/weight shifting;Reaching across midline;Head control activities;Trunk control activities Static Standing Balance Static Standing - Balance Support: Left upper extremity supported Static Standing - Level of Assistance: 1: +2 Total assist Static Standing - Comment/# of Minutes: +2 (A) to maintain midline and prevent right side lateral lean.  Tactile, VCs and manual cues to promote midline.  Pt able to maintain increments of ~10 seconds.  Tactile cues to weight shift to left side especially at hips.  Manual cues to prevent Right knee buckling or hyperextension.  Dynamic Standing Balance Dynamic Standing - Balance Support: Left upper extremity supported;Right upper extremity supported Dynamic Standing - Level of Assistance: 1: +2 Total assist Dynamic Standing - Balance Activities: Lateral lean/weight shifting Dynamic Standing - Comments: +2 (A) to maintain balance with cues for proper weight shift to left and right.  Manual cues to prevent right knee buckling or hyperextension.  End of Session PT - End of Session Equipment Utilized During Treatment: Gait belt Activity Tolerance: Patient tolerated treatment well Patient left: in chair;with call bell/phone within reach;with family/visitor present Nurse Communication: Mobility status   GP     John Montgomery 10/19/2012, 2:58 PM [name], PT DPT [phone]

## 2012-10-19 NOTE — Progress Notes (Signed)
Occupational Therapy Treatment Patient Details Name: John Montgomery MRN: [id number] DOB: 28-Jan-1921 Today's Date: 10/19/2012 Time: 7829-5621 OT Time Calculation (min): 29 min  OT Assessment / Plan / Recommendation Comments on Treatment Session Session focused on core strengthening, weight shifting and locating midline. Pt progressing with therapy- will continue to follow and recommend CIR for d/c plan.    Follow Up Recommendations  CIR    Barriers to Discharge       Equipment Recommendations  None recommended by PT    Recommendations for Other Services Rehab consult  Frequency Min 2X/week   Plan Discharge plan remains appropriate    Precautions / Restrictions Precautions Precautions: Fall Precaution Comments: strong left lateral lean Restrictions Weight Bearing Restrictions: No   Pertinent Vitals/Pain Pt with no c/o pain    ADL  Grooming: Wash/dry face;Set up;Supervision/safety Where Assessed - Grooming: Supported sitting Toilet Transfer: +2 Total assistance Toilet Transfer: Patient Percentage: 70% Toilet Transfer Method: Sit to Barista:  (chair with armrests) Toileting - Clothing Manipulation and Hygiene: +2 Total assistance Toileting - Clothing Manipulation and Hygiene: Patient Percentage: 10% Where Assessed - Toileting Clothing Manipulation and Hygiene: Standing Equipment Used: Gait belt Transfers/Ambulation Related to ADLs: +2total(pt=70%) sit to stand from chair with armrests ADL Comments: performed dynamic sitting balance with Min A to return to midline and to prevent pt from falling forward; perform weight shifting in standing- pt pushing to the right with left UE and LE's- able to maintain midline for ~5sec before leaning right again. RLE fluctuated between hyper-extending and buckling during weight shifting- blocked knee to allow weight bearing.    OT Diagnosis:    OT Problem List:   OT Treatment Interventions:     OT Goals Acute  Rehab OT Goals Time For Goal Achievement: 11/01/12 ADL Goals Pt Will Perform Eating: with set-up;Sitting, chair Pt Will Perform Grooming: with supervision;with set-up;Sitting at sink ADL Goal: Grooming - Progress: Progressing toward goals Pt Will Perform Upper Body Dressing: with set-up;with supervision;Sitting, bed;Sitting, chair Pt Will Perform Lower Body Dressing: with min assist;Sit to stand from bed;Sit to stand from chair;Supported Engineer, water to Toilet: with mod assist;Stand pivot transfer;with DME ADL Goal: Statistician - Progress: Progressing toward goals Pt Will Perform Toileting - Clothing Manipulation: with min assist;Standing ADL Goal: Toileting - Clothing Manipulation - Progress: Progressing toward goals Additional ADL Goal #1: Pt will perform dynamic sitting balance activities greater than with Min A in prep for seated ADLs. ADL Goal: Additional Goal #1 - Progress: Progressing toward goals  Visit Information  Last OT Received On: 10/19/12 Assistance Needed: +2 PT/OT Co-Evaluation/Treatment: Yes    Subjective Data      Prior Functioning       Cognition  Overall Cognitive Status: Impaired Area of Impairment: Attention;Following commands;Awareness of deficits;Problem solving Arousal/Alertness: Awake/alert Orientation Level: Appears intact for tasks assessed Behavior During Session: [facility] for tasks performed Current Attention Level: Sustained Following Commands: Follows multi-step commands consistently Problem Solving: Slow process at times Cognition - Other Comments: Pt with improved cognition however continues to be slow to process at times and needs cues for redirection to task at times    Mobility  Shoulder Instructions Bed Mobility Bed Mobility: Not assessed Transfers Sit to Stand: 1: +2 Total assist;From chair/3-in-1 Sit to Stand: Patient Percentage: 70% Stand to Sit: 1: +2 Total assist;To chair/3-in-1 Stand to Sit: Patient Percentage:  60% Details for Transfer Assistance: +2 (A) to initiate transfer with cues for hand placement and manual cues  to maintain balance and prevent right knee buckle.  Pt with heavy lean to right side        Exercises      Balance Balance Balance Assessed: Yes Static Sitting Balance Static Sitting - Balance Support: Feet supported Static Sitting - Level of Assistance: 4: Min assist;5: Stand by assistance Static Sitting - Comment/# of Minutes: Occasional min (A) to promote upright posture and find midline.  Pt with right lateral lean and able to self correct at times but occasional min (A). Dynamic Sitting Balance Dynamic Sitting - Balance Support: Feet supported;No upper extremity supported Dynamic Sitting - Level of Assistance: 5: Stand by assistance;4: Min assist Dynamic Sitting Balance - Compensations: Occasional min (A) with reaching to right side to prevent LOB to right.  Dynamic Sitting - Balance Activities: Lateral lean/weight shifting;Forward lean/weight shifting;Reaching across midline;Head control activities;Trunk control activities Static Standing Balance Static Standing - Balance Support: Left upper extremity supported Static Standing - Level of Assistance: 1: +2 Total assist Static Standing - Comment/# of Minutes: +2 (A) to maintain midline and prevent right side lateral lean.  Tactile, VCs and manual cues to promote midline.  Pt able to maintain increments of ~10 seconds.  Tactile cues to weight shift to left side especially at hips.  Manual cues to prevent Right knee buckling or hyperextension.  Dynamic Standing Balance Dynamic Standing - Balance Support: Left upper extremity supported;Right upper extremity supported Dynamic Standing - Level of Assistance: 1: +2 Total assist Dynamic Standing - Balance Activities: Lateral lean/weight shifting Dynamic Standing - Comments: +2 (A) to maintain balance with cues for proper weight shift to left and right.  Manual cues to prevent right  knee buckling or hyperextension.   End of Session OT - End of Session Equipment Utilized During Treatment: Gait belt Activity Tolerance: Patient tolerated treatment well Patient left: in chair;with call bell/phone within reach Nurse Communication: Mobility status  GO     Nikeia Henkes 10/19/2012, 4:03 PM

## 2012-10-19 NOTE — Consult Note (Signed)
Physical Medicine and Rehabilitation Consult Reason for Consult: TBI with garbled speech, right facial droop and right sided weakness.  Referring Physician:  Dr. Franky Macho   HPI: John Montgomery is a 78 y.o. male who was knocked over by a neighbor's dog on 10/17/12 while out on a walk. Son noticed blood about patient's face and head and was not acting right. His speech started to worsen soon after with inability to speak as well as difficulty using right side. He was admitted for work up and CCT revealed left frontal lobe IPH with edema and mass effect without shift. CT cervical spine with stable spondylosis. Patient was evaluated by Dr. Franky Macho who recommended F/U CCT for monitoring. Therapy evaluations done yesterday and patient noted to have limited by right sided weakness with RLE instability, with difficulty tracking, easy distractibility, and poor awareness of deficits. CIR recommended for progression. F/U CCT with minimal increase in left frontal-parietal hematoma. Patient developed new onset of atrial fibrillation last night and [facility] cardiology to evaluate patient this am.   Review of Systems  HENT: Negative for hearing loss.   Eyes:       Declining vision since summer  Respiratory: Negative for cough and shortness of breath.   Cardiovascular: Positive for chest pain (right lower rib discomfort). Negative for palpitations.  Neurological: Positive for focal weakness and weakness. Negative for dizziness and headaches.  Psychiatric/Behavioral: The patient is nervous/anxious (concerned about not getting his home medications.).    Past Medical History  Diagnosis Date  . Hypertension   . Hyperlipidemia   . Myocardial infarction    Past Surgical History  Procedure Date  . Angioplasty    History reviewed. No pertinent family history.  Social History: Widowed. Walks about 2 miles daily, does housework and cooking. Stopped driving this summer due to vision problems.  His youngest son lives with  him. He  reports that he has never smoked. He has never used smokeless tobacco. He reports that he does not drink alcohol or use illicit drugs.   Allergies  Allergen Reactions  . Penicillins     hives   Medications Prior to Admission  Medication Sig Dispense Refill  . amLODipine (NORVASC) 5 MG tablet Take 5 mg by mouth daily.      Marland Kitchen aspirin 81 MG tablet Take 81 mg by mouth at bedtime.       Marland Kitchen CALCIUM-VITAMIN D PO Take 1 tablet by mouth daily.      . Cholecalciferol (VITAMIN D3) 2000 UNITS TABS Take 1 tablet by mouth daily.      . clopidogrel (PLAVIX) 75 MG tablet Take 75 mg by mouth daily.      . metoprolol tartrate (LOPRESSOR) 25 MG tablet Take 25 mg by mouth daily.      . Multiple Vitamins-Minerals (EYE VITAMINS PO) Take 1 tablet by mouth daily.      Marland Kitchen omeprazole (PRILOSEC) 20 MG capsule Take 20 mg by mouth daily.      . pravastatin (PRAVACHOL) 80 MG tablet Take 80 mg by mouth at bedtime.       . ramipril (ALTACE) 10 MG capsule Take 10 mg by mouth daily.        Home: Home Living Lives With: Family (2 sons work during the day; family can arrange 24hr) Available Help at Discharge: Family Type of Home: House Home Access: Ramped entrance (back of the house) Home Layout: One level Bathroom Shower/Tub: Child psychotherapist: Standard Home Adaptive Equipment: None  Functional History: Prior  Function Able to Take Stairs?: Yes Driving: No Vocation: Other (comment) (("very sharp" per daughter, active, walks daily, social)) Functional Status:  Mobility: Bed Mobility Bed Mobility: Supine to Sit;Sitting - Scoot to Edge of Bed Supine to Sit: 3: Mod assist;HOB flat;With rails Sitting - Scoot to Edge of Bed: 2: Max assist Transfers Transfers: Sit to Stand;Stand to [employer] Transfers Sit to Stand: 1: +2 Total assist;From bed Sit to Stand: Patient Percentage: 50% Stand to Sit: 1: +2 Total assist;To bed Stand to Sit: Patient Percentage: 50% Stand Pivot  Transfers: 1: +2 Total assist Stand Pivot Transfers: Patient Percentage: 30% Ambulation/Gait Ambulation/Gait Assistance: Not tested (comment)    ADL: ADL Grooming: Moderate assistance;Wash/dry face;Wash/dry hands Where Assessed - Grooming: Supported sitting Lower Body Bathing: +2 Total assistance Where Assessed - Lower Body Bathing: Supported sit to stand Upper Body Dressing: Maximal assistance Where Assessed - Upper Body Dressing: Supported sitting Lower Body Dressing: +2 Total assistance Where Assessed - Lower Body Dressing: Supported sit to Pharmacist, hospital: +2 Total assistance Toilet Transfer Method: Surveyor, minerals:  (bed to chair) Equipment Used: Gait belt Transfers/Ambulation Related to ADLs: +2total(pt=50%); +2 total A(pt =30%) for stand pivot from EOB to chair, assist to advance each LE as well as weight shift as pt resisting/with strong right lateral lean.   Cognition: Cognition Overall Cognitive Status: Impaired Arousal/Alertness: Awake/alert Orientation Level: Oriented X4 Attention: Focused Focused Attention: Impaired Focused Attention Impairment: Verbal basic;Functional basic Awareness: Impaired Awareness Impairment: Intellectual impairment Problem Solving: Impaired Problem Solving Impairment: Verbal basic;Functional basic Safety/Judgment: Impaired Cognition Overall Cognitive Status: Impaired Area of Impairment: Attention;Following commands;Awareness of deficits;Problem solving Arousal/Alertness: Awake/alert Orientation Level: Appears intact for tasks assessed Behavior During Session: [facility] for tasks performed Current Attention Level: Sustained (~5sec, at most 10) Following Commands: Follows one step commands consistently Awareness of Deficits: Pt stated "I thought I could walk to that chair.  I guess I was wrong."  (pt assumed he would be able to ambulate without movement in right LE however able to recgonize after attempting  ambulation pt is unable.) Problem Solving: Slow process at times Cognition - Other Comments: pt unable to attend during vision testing  Blood pressure 146/70, pulse 74, temperature 97.1 F (36.2 C), temperature source Oral, resp. rate 18, height 5\' 5"  (1.651 m), weight 55.1 kg (121 lb 7.6 oz), SpO2 98.00%. Physical Exam  Nursing note and vitals reviewed. Constitutional: He is oriented to person, place, and time.       Frail appearing elderly male.  HENT:  Head: Normocephalic.  Eyes: Pupils are equal, round, and reactive to light.  Neck: Normal range of motion.  Cardiovascular: Normal rate.  An irregular rhythm present.  Pulmonary/Chest: Effort normal and breath sounds normal.  Abdominal: Soft. Bowel sounds are normal. There is no tenderness.  Musculoskeletal: He exhibits no edema.  Neurological: He is alert and oriented to person, place, and time.       Speech clear. Verbose and needs occasional redirection. Expressing concerns regarding his meds.  Difficulty following two step commands. Able to follow simple commands. Right hemiparesis with motor apraxia? RUE 0/5 deltoid, bicep, tricep, trace to 1 at wrist/HI, RLE is 0/5. Does sense pain on the right. Right central 7 and tongue deviation.  Skin: Skin is warm and dry.    Results for orders placed during the hospital encounter of 10/16/12 (from the past 24 hour(s))  BASIC METABOLIC PANEL     Status: Abnormal   Collection Time   10/18/12  9:03 PM      Component Value Range   Sodium 133 (*) 135 - 145 mEq/L   Potassium 4.0  3.5 - 5.1 mEq/L   Chloride 102  96 - 112 mEq/L   CO2 22  19 - 32 mEq/L   Glucose, Bld 97  70 - 99 mg/dL   BUN 19  6 - 23 mg/dL   Creatinine, Ser 8.65  0.50 - 1.35 mg/dL   Calcium 8.7  8.4 - 78.4 mg/dL   GFR calc non Af Amer 72 (*) >90 mL/min   GFR calc Af Amer 84 (*) >90 mL/min  MAGNESIUM     Status: Normal   Collection Time   10/18/12  9:03 PM      Component Value Range   Magnesium 2.1  1.5 - 2.5 mg/dL  CK  TOTAL AND CKMB     Status: Abnormal   Collection Time   10/18/12  9:03 PM      Component Value Range   Total CK 1027 (*) 7 - 232 U/L   CK, MB 14.2 (*) 0.3 - 4.0 ng/mL   Relative Index 1.4  0.0 - 2.5  TROPONIN I     Status: Normal   Collection Time   10/18/12  9:03 PM      Component Value Range   Troponin I <0.30  <0.30 ng/mL  CALCIUM, IONIZED     Status: Normal   Collection Time   10/18/12  9:04 PM      Component Value Range   Calcium, Ion 1.22  1.12 - 1.32 mmol/L   Ct Head Wo Contrast  10/18/2012  *RADIOLOGY REPORT*  Clinical Data: Hypertension.  Hyperlipidemia.  Intracranial hemorrhage follow-up.  No acute changes.  Fall.  On Plavix.  CT HEAD WITHOUT CONTRAST  Technique:  Contiguous axial images were obtained from the base of the skull through the vertex without contrast.  Comparison: 10/16/2012.  Findings: Motion degraded exam.  Scalp laceration left parietal region without underlying fracture.  Superior left frontal - parietal lobe hemorrhage with surrounding vasogenic edema.  The larger component of the intracranial hemorrhage appears slightly more prominent in size with maximal transverse dimension of 4 x 2.2 cm versus prior 3.6 x 2.2 cm. Slight increase in surrounding vasogenic edema.  Question motion artifact versus small amount of blood adjacent to the anterior falx/crista gali.  Baseline atrophy and small vessel disease type changes without CT evidence of large acute thrombotic infarct.  IMPRESSION: Motion degraded exam.  Suggestion of minimal increase in left frontal - parietal lobe hematoma and surrounding vasogenic edema.  Question motion artifact versus small amount of blood adjacent to the anterior falx/crista gali.  This has been made a PRA call report utilizing dashboard call feature.   Original Report Authenticated By: Lacy Duverney, M.D.     Assessment/Plan: Diagnosis: left FP intracranial hemorrhage after fall with dense right hemiparesis 1. Does the need for close, 24 hr/day  medical supervision in concert with the patient's rehab needs make it unreasonable for this patient to be served in a less intensive setting? Yes 2. Co-Morbidities requiring supervision/potential complications: htn 3. Due to bladder management, bowel management, safety, skin/wound care, disease management, medication administration, pain management and patient education, does the patient require 24 hr/day rehab nursing? Yes 4. Does the patient require coordinated care of a physician, rehab nurse, PT (1-2 hrs/day, 5 days/week), OT (1-2 hrs/day, 5 days/week) and SLP (1-2 hrs/day, 5 days/week) to address physical and functional deficits in the context of  the above medical diagnosis(es)? Yes Addressing deficits in the following areas: balance, endurance, locomotion, strength, transferring, bowel/bladder control, bathing, dressing, feeding, grooming, toileting, cognition, speech, language, swallowing and psychosocial support 5. Can the patient actively participate in an intensive therapy program of at least 3 hrs of therapy per day at least 5 days per week? Yes 6. The potential for patient to make measurable gains while on inpatient rehab is excellent 7. Anticipated functional outcomes upon discharge from inpatient rehab are supervision to min assist with PT, min to mod assist  with OT, supervision to mod I with SLP. 8. Estimated rehab length of stay to reach the above functional goals is: 2-3 weeks 9. Does the patient have adequate social supports to accommodate these discharge functional goals? Yes and Potentially 10. Anticipated D/C setting: Home 11. Anticipated post D/C treatments: Outpt therapy 12. Overall Rehab/Functional Prognosis: excellent  RECOMMENDATIONS: This patient's condition is appropriate for continued rehabilitative care in the following setting: CIR Patient has agreed to participate in recommended program. Yes Note that insurance prior authorization may be required for reimbursement  for recommended care.  Comment:Rehab RN to follow up.   Ivory Broad, MD     10/19/2012

## 2012-10-20 ENCOUNTER — Inpatient Hospital Stay (HOSPITAL_COMMUNITY)
Admission: RE | Admit: 2012-10-20 | Discharge: 2012-11-09 | DRG: 945 | Disposition: A | Discharge status: Skilled Nursing Facility | Payer: Medicare Other | Source: Ambulatory Visit | Attending: Physical Medicine & Rehabilitation | Admitting: Physical Medicine & Rehabilitation

## 2012-10-20 DIAGNOSIS — S065X9A Traumatic subdural hemorrhage with loss of consciousness of unspecified duration, initial encounter: Secondary | ICD-10-CM | POA: Diagnosis present

## 2012-10-20 DIAGNOSIS — I739 Peripheral vascular disease, unspecified: Secondary | ICD-10-CM | POA: Diagnosis present

## 2012-10-20 DIAGNOSIS — F4323 Adjustment disorder with mixed anxiety and depressed mood: Secondary | ICD-10-CM

## 2012-10-20 DIAGNOSIS — I4892 Unspecified atrial flutter: Secondary | ICD-10-CM

## 2012-10-20 DIAGNOSIS — Z5189 Encounter for other specified aftercare: Principal | ICD-10-CM

## 2012-10-20 DIAGNOSIS — M47812 Spondylosis without myelopathy or radiculopathy, cervical region: Secondary | ICD-10-CM

## 2012-10-20 DIAGNOSIS — I4891 Unspecified atrial fibrillation: Secondary | ICD-10-CM

## 2012-10-20 DIAGNOSIS — I252 Old myocardial infarction: Secondary | ICD-10-CM

## 2012-10-20 DIAGNOSIS — I480 Paroxysmal atrial fibrillation: Secondary | ICD-10-CM | POA: Diagnosis present

## 2012-10-20 DIAGNOSIS — IMO0002 Reserved for concepts with insufficient information to code with codable children: Secondary | ICD-10-CM

## 2012-10-20 DIAGNOSIS — L84 Corns and callosities: Secondary | ICD-10-CM

## 2012-10-20 DIAGNOSIS — K117 Disturbances of salivary secretion: Secondary | ICD-10-CM

## 2012-10-20 DIAGNOSIS — Z79899 Other long term (current) drug therapy: Secondary | ICD-10-CM

## 2012-10-20 DIAGNOSIS — G936 Cerebral edema: Secondary | ICD-10-CM

## 2012-10-20 DIAGNOSIS — D62 Acute posthemorrhagic anemia: Secondary | ICD-10-CM

## 2012-10-20 DIAGNOSIS — W64XXXA Exposure to other animate mechanical forces, initial encounter: Secondary | ICD-10-CM

## 2012-10-20 DIAGNOSIS — B9689 Other specified bacterial agents as the cause of diseases classified elsewhere: Secondary | ICD-10-CM

## 2012-10-20 DIAGNOSIS — I10 Essential (primary) hypertension: Secondary | ICD-10-CM | POA: Diagnosis present

## 2012-10-20 DIAGNOSIS — M62838 Other muscle spasm: Secondary | ICD-10-CM

## 2012-10-20 DIAGNOSIS — W19XXXA Unspecified fall, initial encounter: Secondary | ICD-10-CM

## 2012-10-20 DIAGNOSIS — S069X9A Unspecified intracranial injury with loss of consciousness of unspecified duration, initial encounter: Secondary | ICD-10-CM

## 2012-10-20 DIAGNOSIS — Y9301 Activity, walking, marching and hiking: Secondary | ICD-10-CM

## 2012-10-20 DIAGNOSIS — E785 Hyperlipidemia, unspecified: Secondary | ICD-10-CM

## 2012-10-20 DIAGNOSIS — N390 Urinary tract infection, site not specified: Secondary | ICD-10-CM

## 2012-10-20 LAB — BASIC METABOLIC PANEL
BUN: 16 mg/dL (ref 6–23)
CO2: 23 mEq/L (ref 19–32)
Calcium: 8.8 mg/dL (ref 8.4–10.5)
Chloride: 105 mEq/L (ref 96–112)
Creatinine, Ser: 0.83 mg/dL (ref 0.50–1.35)
GFR calc Af Amer: 88 mL/min — ABNORMAL LOW (ref >90)
GFR calc non Af Amer: 76 mL/min — ABNORMAL LOW (ref >90)
Glucose, Bld: 84 mg/dL (ref 70–99)
Potassium: 3.7 mEq/L (ref 3.5–5.1)
Sodium: 138 mEq/L (ref 135–145)

## 2012-10-20 LAB — CBC
HCT: 29.5 % — ABNORMAL LOW (ref 39.0–52.0)
Hemoglobin: 10.4 g/dL — ABNORMAL LOW (ref 13.0–17.0)
MCH: 31.4 pg (ref 26.0–34.0)
MCHC: 35.3 g/dL (ref 30.0–36.0)
MCV: 89.1 fL (ref 78.0–100.0)
Platelets: 183 10*3/uL (ref 150–400)
RBC: 3.31 MIL/uL — ABNORMAL LOW (ref 4.22–5.81)
RDW: 12.4 % (ref 11.5–15.5)
WBC: 7.6 10*3/uL (ref 4.0–10.5)

## 2012-10-20 LAB — TSH: TSH: 1.813 u[IU]/mL (ref 0.350–4.500)

## 2012-10-20 MED ORDER — PROCHLORPERAZINE 25 MG RE SUPP
12.5000 mg | Freq: Four times a day (QID) | RECTAL | Status: DC | PRN
  Filled 2012-10-20: qty 1

## 2012-10-20 MED ORDER — WHITE PETROLATUM GEL
Status: AC
  Filled 2012-10-20: qty 5

## 2012-10-20 MED ORDER — SENNOSIDES-DOCUSATE SODIUM 8.6-50 MG PO TABS
1.0000 | ORAL_TABLET | Freq: Two times a day (BID) | ORAL | Status: DC
  Administered 2012-10-20 – 2012-11-09 (×40): 1 via ORAL
  Filled 2012-10-20 (×40): qty 1

## 2012-10-20 MED ORDER — AMLODIPINE BESYLATE 5 MG PO TABS
5.0000 mg | ORAL_TABLET | Freq: Every day | ORAL | Status: DC
  Administered 2012-10-21 – 2012-11-07 (×18): 5 mg via ORAL
  Filled 2012-10-20 (×21): qty 1

## 2012-10-20 MED ORDER — GUAIFENESIN-DM 100-10 MG/5ML PO SYRP: 5.0000 mL | ORAL_SOLUTION | Freq: Four times a day (QID) | ORAL | Status: DC | PRN

## 2012-10-20 MED ORDER — ACETAMINOPHEN-CODEINE #3 300-30 MG PO TABS
1.0000 | ORAL_TABLET | ORAL | Status: DC | PRN
  Administered 2012-10-22 – 2012-10-27 (×10): 1 via ORAL
  Administered 2012-10-27 – 2012-10-28 (×2): 2 via ORAL
  Administered 2012-10-28: 1 via ORAL
  Administered 2012-10-29: 2 via ORAL
  Administered 2012-10-29 – 2012-10-30 (×4): 1 via ORAL
  Administered 2012-10-30 – 2012-10-31 (×3): 2 via ORAL
  Administered 2012-11-01: 1 via ORAL
  Administered 2012-11-01 – 2012-11-02 (×3): 2 via ORAL
  Administered 2012-11-03: 1 via ORAL
  Administered 2012-11-04: 2 via ORAL
  Administered 2012-11-05: 1 via ORAL
  Administered 2012-11-05 – 2012-11-08 (×5): 2 via ORAL
  Filled 2012-10-20 (×3): qty 2
  Filled 2012-10-20: qty 1
  Filled 2012-10-20 (×2): qty 2
  Filled 2012-10-20: qty 1
  Filled 2012-10-20 (×2): qty 2
  Filled 2012-10-20 (×2): qty 1
  Filled 2012-10-20: qty 2
  Filled 2012-10-20: qty 1
  Filled 2012-10-20 (×4): qty 2
  Filled 2012-10-20: qty 1
  Filled 2012-10-20: qty 2
  Filled 2012-10-20 (×7): qty 1
  Filled 2012-10-20: qty 2
  Filled 2012-10-20 (×2): qty 1
  Filled 2012-10-20 (×3): qty 2
  Filled 2012-10-20 (×4): qty 1
  Filled 2012-10-20 (×2): qty 2
  Filled 2012-10-20 (×2): qty 1

## 2012-10-20 MED ORDER — POTASSIUM CHLORIDE CRYS ER 10 MEQ PO TBCR
10.0000 meq | EXTENDED_RELEASE_TABLET | Freq: Two times a day (BID) | ORAL | Status: DC
  Administered 2012-10-20 – 2012-11-09 (×40): 10 meq via ORAL
  Filled 2012-10-20 (×44): qty 1

## 2012-10-20 MED ORDER — BACLOFEN 5 MG HALF TABLET
5.0000 mg | ORAL_TABLET | Freq: Three times a day (TID) | ORAL | Status: DC | PRN
  Administered 2012-10-22 – 2012-10-23 (×2): 5 mg via ORAL
  Filled 2012-10-20 (×4): qty 1

## 2012-10-20 MED ORDER — AMLODIPINE BESYLATE 5 MG PO TABS
5.0000 mg | ORAL_TABLET | Freq: Every day | ORAL | Status: DC
  Administered 2012-10-20: 5 mg via ORAL
  Filled 2012-10-20: qty 1

## 2012-10-20 MED ORDER — FLEET ENEMA 7-19 GM/118ML RE ENEM: 1.0000 | ENEMA | Freq: Once | RECTAL | Status: AC | PRN

## 2012-10-20 MED ORDER — TRAZODONE HCL 50 MG PO TABS
25.0000 mg | ORAL_TABLET | Freq: Every evening | ORAL | Status: DC | PRN
  Administered 2012-10-20: 25 mg via ORAL
  Administered 2012-10-22 – 2012-11-02 (×12): 50 mg via ORAL
  Administered 2012-11-03: 25 mg via ORAL
  Administered 2012-11-04 – 2012-11-08 (×5): 50 mg via ORAL
  Filled 2012-10-20 (×19): qty 1

## 2012-10-20 MED ORDER — BACLOFEN 5 MG HALF TABLET: 5.0000 mg | ORAL_TABLET | Freq: Three times a day (TID) | ORAL | Status: DC | PRN

## 2012-10-20 MED ORDER — METOPROLOL TARTRATE 12.5 MG HALF TABLET
12.5000 mg | ORAL_TABLET | Freq: Two times a day (BID) | ORAL | Status: DC
  Administered 2012-10-20 – 2012-11-09 (×40): 12.5 mg via ORAL
  Filled 2012-10-20 (×45): qty 1

## 2012-10-20 MED ORDER — BISACODYL 10 MG RE SUPP
10.0000 mg | Freq: Every day | RECTAL | Status: DC | PRN
  Administered 2012-10-28: 10 mg via RECTAL
  Filled 2012-10-20: qty 1

## 2012-10-20 MED ORDER — PROCHLORPERAZINE EDISYLATE 5 MG/ML IJ SOLN
5.0000 mg | Freq: Four times a day (QID) | INTRAMUSCULAR | Status: DC | PRN
  Filled 2012-10-20: qty 2

## 2012-10-20 MED ORDER — PROCHLORPERAZINE MALEATE 5 MG PO TABS
5.0000 mg | ORAL_TABLET | Freq: Four times a day (QID) | ORAL | Status: DC | PRN
  Filled 2012-10-20: qty 2

## 2012-10-20 MED ORDER — PANTOPRAZOLE SODIUM 40 MG PO TBEC
40.0000 mg | DELAYED_RELEASE_TABLET | Freq: Every day | ORAL | Status: DC
Start: 2012-10-20 — End: 2012-11-09
  Administered 2012-10-20 – 2012-11-08 (×20): 40 mg via ORAL
  Filled 2012-10-20 (×20): qty 1

## 2012-10-20 MED ORDER — DIPHENHYDRAMINE HCL 12.5 MG/5ML PO ELIX: 12.5000 mg | ORAL_SOLUTION | Freq: Four times a day (QID) | ORAL | Status: DC | PRN

## 2012-10-20 MED ORDER — ALUM & MAG HYDROXIDE-SIMETH 200-200-20 MG/5ML PO SUSP: 30.0000 mL | ORAL | Status: DC | PRN

## 2012-10-20 MED ORDER — ACETAMINOPHEN 325 MG PO TABS
325.0000 mg | ORAL_TABLET | ORAL | Status: DC | PRN
  Administered 2012-10-22 – 2012-10-26 (×3): 650 mg via ORAL
  Filled 2012-10-20 (×4): qty 2

## 2012-10-20 MED ORDER — ATORVASTATIN CALCIUM 20 MG PO TABS
20.0000 mg | ORAL_TABLET | Freq: Every day | ORAL | Status: DC
  Filled 2012-10-20: qty 1

## 2012-10-20 MED ORDER — ATORVASTATIN CALCIUM 20 MG PO TABS
20.0000 mg | ORAL_TABLET | Freq: Every day | ORAL | Status: DC
  Administered 2012-10-20 – 2012-11-08 (×19): 20 mg via ORAL
  Filled 2012-10-20 (×23): qty 1

## 2012-10-20 NOTE — Progress Notes (Signed)
Patient ID: John Montgomery, male   DOB: 1921-08-07, 78 y.o.   MRN: [id number] BP 161/48  Pulse 74  Temp 98.2 F (36.8 C) (Oral)  Resp 16  Ht 5\' 5"  (1.651 m)  Wt 55.1 kg (121 lb 7.6 oz)  BMI 20.21 kg/m2  SpO2 99% Alert and oriented x 4, speech is clear Plegic on right side, improvement with strength Transfer to floor with telemetry

## 2012-10-20 NOTE — Progress Notes (Signed)
Pt arrived to room 4009 from 4no via bed; alert and oriented x4, slight delayed processing, response; VSS; denies any pain at present; family at [facility]; oriented to room and rehab process.[name]

## 2012-10-20 NOTE — Progress Notes (Signed)
Physical Therapy Treatment Patient Details Name: HOLTEN SPANO MRN: [id number] DOB: February 07, 1921 Today's Date: 10/20/2012 Time: 4098-1191 PT Time Calculation (min): 30 min  PT Assessment / Plan / Recommendation Comments on Treatment Session  Pt. cooperative and stating that he was excited to be going to CIR soon. Pt. cont's with lateral lean to the Rt and able to self correct at times and with cues other times. Pt. worked on standing balance and still with lateral lean to the Rt and receiving manual facilitation and stretching of Rt dorsiflexors. Pt presents to be having some tone in the Rt plantarflexors while in standing, causing the Rt knee to go into flexion.     Follow Up Recommendations  CIR     Does the patient have the potential to tolerate intense rehabilitation     Barriers to Discharge        Equipment Recommendations  None recommended by PT    Recommendations for Other Services Rehab consult  Frequency Min 4X/week   Plan Discharge plan remains appropriate;Frequency remains appropriate    Precautions / Restrictions Precautions Precautions: Fall Precaution Comments: Strong lateral lean to Rt Restrictions Weight Bearing Restrictions: No   Pertinent Vitals/Pain Patient denies pain    Mobility  Bed Mobility Bed Mobility: Not assessed Details for Bed Mobility Assistance: Pt. presented sitting in chair upon arrival to room Transfers Transfers: Sit to Stand;Stand to Sit Sit to Stand: 1: +2 Total assist;From chair/3-in-1 Sit to Stand: Patient Percentage: 70% Stand to Sit: 1: +2 Total assist;To chair/3-in-1 Stand to Sit: Patient Percentage: 60% Details for Transfer Assistance: +2 total (A) to initiate transfers with manual positioning from therapist/SPTA to allow for set up of sit<>stands. Ambulation/Gait Ambulation/Gait Assistance: Not tested (comment) Stairs: No Wheelchair Mobility Wheelchair Mobility: No Modified Rankin (Stroke Patients Only) Pre-Morbid Rankin  Score: No symptoms Modified Rankin: Severe disability      PT Goals Acute Rehab PT Goals PT Goal Formulation: With patient/family Time For Goal Achievement: 10/25/12 Potential to Achieve Goals: Good Pt will go Supine/Side to Sit: with supervision Pt will Sit at [facility] of Bed: with supervision;1-2 min PT Goal: Sit at Edge Of Bed - Progress: Progressing toward goal Pt will go Sit to Supine/Side: with min assist Pt will go Sit to Stand: with mod assist PT Goal: Sit to Stand - Progress: Not met Pt will go Stand to Sit: with mod assist PT Goal: Stand to Sit - Progress: Not met Pt will Transfer Bed to Chair/Chair to Bed: with mod assist Pt will Stand: with mod assist;1 - 2 min PT Goal: Stand - Progress: Progressing toward goal  Visit Information  Last PT Received On: 10/20/12 Assistance Needed: +2    Subjective Data  Subjective: "Maybe I am improving some" Patient Stated Goal: To return home   Cognition  Overall Cognitive Status: Impaired Area of Impairment: Attention;Following commands;Awareness of deficits;Problem solving Arousal/Alertness: Awake/alert Orientation Level: Appears intact for tasks assessed Behavior During Session: [facility] for tasks performed Current Attention Level: Sustained Following Commands: Follows multi-step commands consistently Problem Solving: Slow process at times Cognition - Other Comments: Pt. continues to need increased time to follow commands and cues for staying focused on task.    Balance  Balance Balance Assessed: Yes Static Sitting Balance Static Sitting - Balance Support: Feet supported Static Sitting - Level of Assistance: 4: Min assist;5: Stand by assistance Static Sitting - Comment/# of Minutes: Occassional instances of min (A) to initiate erect sitting posture. Pt with lateral lean to Rt and at  times was able to correct without cuing, but continuing to need cues to correct his posture. SBA for safety when pt. is not having to correct posture.  While sitting at edge of chair pt receiving sustained stretch of Rt PFs and AAROM of Rt knee and foot. Static Standing Balance Static Standing - Balance Support: Right upper extremity supported;Bilateral upper extremity supported Static Standing - Level of Assistance: 1: +2 Total assist Static Standing - Comment/# of Minutes: +2(A) for (A)ing pt into position with his Bil UEs supported by straightback chair and facilitation of shoulders and waist to acheive erect posture and to maintain that posture. Tactile cues to Rt LE to promote dorsiflexion and to prevent hypersextension of the Rt. knee. Pt given cues to transition weight anteriorly and to broaden his shoulders. Pt able to hold posture for ~3-5 seconds. Pt. also with lateral lean to the Rt and facilitated at hips to evenly distribute weight between both LEs. Dynamic Standing Balance Dynamic Standing - Balance Support: Bilateral upper extremity supported Dynamic Standing - Level of Assistance: 1: +2 Total assist Dynamic Standing - Balance Activities: Lateral lean/weight shifting Dynamic Standing - Comments: +2(A) for pt. to lateraly shift his weight from Rt LE to evenly distribute his body weight between both LEs. Facilitation at pelvis and at Rt knee to promote DF and prevent Rt knee buckling.  End of Session PT - End of Session Equipment Utilized During Treatment: Gait belt Activity Tolerance: Patient tolerated treatment well Patient left: in chair;with call bell/phone within reach Nurse Communication: Mobility status    Mertie Clause, SPTA 10/20/2012, 1:18 PM

## 2012-10-20 NOTE — Progress Notes (Signed)
Subjective:  Up with PT  Objective:  Vital Signs in the last 24 hours: Temp:  [97.9 F (36.6 C)-98.2 F (36.8 C)] 98.2 F (36.8 C) (12/06 0833) Pulse Rate:  [38-108] 85  (12/06 0900) Resp:  [11-25] 14  (12/06 0900) BP: (96-176)/(37-73) 136/49 mmHg (12/06 0900) SpO2:  [97 %-100 %] 98 % (12/06 0900)  Intake/Output from previous day:  Intake/Output Summary (Last 24 hours) at 10/20/12 1014 Last data filed at 10/20/12 0700  Gross per 24 hour  Intake   1575 ml  Output   1650 ml  Net    -75 ml    Physical Exam: General appearance: alert, cooperative and no distress Heart: regularly irregular rhythm   Rate: 90  Rhythm: normal sinus rhythm and PACs, PAF earlier  Lab Results:  Basename 10/20/12 0505  WBC 7.6  HGB 10.4*  PLT 183    Basename 10/20/12 0505 10/18/12 2103  NA 138 133*  K 3.7 4.0  CL 105 102  CO2 23 22  GLUCOSE 84 97  BUN 16 19  CREATININE 0.83 0.93    Basename 10/18/12 2103  TROPONINI <0.30   Hepatic Function Panel No results found for this basename: PROT,ALBUMIN,AST,ALT,ALKPHOS,BILITOT,BILIDIR,IBILI in the last 72 hours No results found for this basename: CHOL in the last 72 hours No results found for this basename: INR in the last 72 hours  Imaging: Imaging results have been reviewed  Cardiac Studies:  Assessment/Plan:   Principal Problem:  *PAF (paroxysmal atrial fibrillation) Active Problems:  SDH (subdural hematoma)  First degree AV block  HTN (hypertension)  CAD, RCA PTCA '98, RCA DES '06. Low risk Nuc 2011  Dyslipidemia  PVD, moderate RICA disease  Diastolic dysfunction, grade 1 with an EF of 50-55% 2011   Plan- He is mainly in NSR now. He has a tendency towards SSS so will not push beta blocker at this time. Will resume Norvasc for HTN. OK to go to Rehab from our standpoint, will will see again on Monday. Call if any questions this weekend. Try and keep K+ closer to 4.0- will give today.   [employer] PA-C 10/20/2012,  10:14 AM

## 2012-10-20 NOTE — Progress Notes (Signed)
Pt. Seen and examined. Agree with the NP/PA-C note as written.  Maintaining sinus with PAC's. On Norvasc 5 mg for hypertension, may need to increase. No plans for anticoagulation in the setting of recent intracerebral bleed. Will follow peripherally over the weekend.  Chrystie Nose, MD, [facility] Attending Cardiologist [facility]

## 2012-10-20 NOTE — PMR Pre-admission (Signed)
PMR Admission Coordinator Pre-Admission Assessment  Patient: John Montgomery is an 78 y.o., male MRN: [id number] DOB: 04-26-1921 Height: 5\' 5"  (165.1 cm) Weight: 55.1 kg (121 lb 7.6 oz)              Insurance Information HMO:  No    PPO:       PCP:       IPA:       80/20:       OTHER:   PRIMARY: Medicare A/B      Policy#: [id number]      Subscriber: Kenna Gilbert CM Name:        Phone#:       Fax#:   Pre-Cert#:        Employer: Retired Benefits:  Phone #:       Name: Armed forces technical officer. Date: 08/15/88     Deduct: $1184      Out of Pocket Max: none      Life Max: unlimited CIR: 100%      SNF: 100 days Outpatient: 80%     Co-Pay: 20% Home Health: 100%      Co-Pay: none DME: 80%     Co-Pay: 20% Providers: patient's choice  Emergency Contact Information Contact Information    Name Relation Home Work Mobile   Viann Fish  [phone]       Current Medical History  Patient Admitting Diagnosis: Left FP intracranial hemorrhage after fall with dense right hemiparesis  History of Present Illness: A 78 y.o. male who was knocked over by a neighbor's dog on 10/17/12 while out on a walk. Son noticed blood about patient's face and head and was not acting right. His speech started to worsen soon after with inability to speak as well as difficulty using right side. He was admitted for work up and CCT revealed left frontal lobe IPH with edema and mass effect without shift. CT cervical spine with stable spondylosis. Patient was evaluated by Dr. Franky Macho who recommended F/U CCT for monitoring. Therapy evaluations done yesterday and patient noted to have limited by right sided weakness with RLE instability, with difficulty tracking, easy distractibility, and poor awareness of deficits. CIR recommended for progression. F/U CCT with minimal increase in left frontal-parietal hematoma. Patient developed new onset of atrial fibrillation  and [facility] cardiology has evaluated patient.  Cardiology has cleared patient today,  12/06 for inpatient rehab admission.  Past Medical History  Past Medical History  Diagnosis Date  . Hypertension   . Hyperlipidemia   . Myocardial infarction     Family History  family history is not on file.  Prior Rehab/Hospitalizations:  No previous rehab admissions.   Current Medications  Current facility-administered medications:0.9 %  sodium chloride infusion, , Intravenous, Continuous, Carmela Hurt, MD, Last Rate: 75 mL/hr at 10/20/12 0800;  acetaminophen (TYLENOL) suppository 650 mg, 650 mg, Rectal, Q4H PRN, Carmela Hurt, MD;  acetaminophen (TYLENOL) tablet 650 mg, 650 mg, Oral, Q4H PRN, Carmela Hurt, MD acetaminophen-codeine (TYLENOL #3) 300-30 MG per tablet 1-2 tablet, 1-2 tablet, Oral, Q4H PRN, Carmela Hurt, MD, 1 tablet at 10/19/12 2043;  amLODipine (NORVASC) tablet 5 mg, 5 mg, Oral, Daily, Eda Paschal [place], [place];  labetalol (NORMODYNE,TRANDATE) injection 10-40 mg, 10-40 mg, Intravenous, Q10 min PRN, Carmela Hurt, MD, 10 mg at 10/20/12 2952 metoprolol tartrate (LOPRESSOR) tablet 12.5 mg, 12.5 mg, Oral, BID, Eda Paschal [place], [place], 12.5 mg at 10/19/12 2044;  [COMPLETED] metoprolol tartrate (LOPRESSOR) tablet 25 mg, 25 mg, Oral, Once, [employer]  K Kilroy, PA, 25 mg at 10/19/12 1151;  ondansetron (ZOFRAN) injection 4 mg, 4 mg, Intravenous, Q6H PRN, Carmela Hurt, MD, 4 mg at 10/17/12 0247;  pantoprazole (PROTONIX) EC tablet 40 mg, 40 mg, Oral, QHS, Benny Lennert, PHARMD, 40 mg at 10/19/12 2043 senna-docusate (Senokot-S) tablet 1 tablet, 1 tablet, Oral, BID, Carmela Hurt, MD, 1 tablet at 10/19/12 2044;  simvastatin (ZOCOR) tablet 40 mg, 40 mg, Oral, q1800, Abelino Derrick, [place], 40 mg at 10/19/12 1739  Patients Current Diet: General  Precautions / Restrictions Precautions Precautions: Fall Precaution Comments: strong left lateral lean Restrictions Weight Bearing Restrictions: No   Prior Activity Level Community (5-7x/wk): Went out daily, was active, walked 2 miles a day.  Home  Assistive Devices / Equipment Home Assistive Devices/Equipment: None Home Adaptive Equipment: None  Prior Functional Level Prior Function Level of Independence: Independent Able to Take Stairs?: Yes Driving: No Vocation: Other (comment) (("very sharp" per daughter, active, walks daily, social))  Current Functional Level Cognition  Arousal/Alertness: Awake/alert Overall Cognitive Status: Impaired Overall Cognitive Status: Impaired Current Attention Level: Sustained Orientation Level: Oriented X4 Following Commands: Follows multi-step commands consistently Awareness of Deficits: Pt stated "I thought I could walk to that chair.  I guess I was wrong."  (pt assumed he would be able to ambulate without movement in right LE however able to recgonize after attempting ambulation pt is unable.) Cognition - Other Comments: Pt with improved cognition however continues to be slow to process at times and needs cues for redirection to task at times Attention: Focused Focused Attention: Impaired Focused Attention Impairment: Verbal basic;Functional basic Awareness: Impaired Awareness Impairment: Intellectual impairment Problem Solving: Impaired Problem Solving Impairment: Verbal basic;Functional basic Safety/Judgment: Impaired    Extremity Assessment (includes Sensation/Coordination)  RUE ROM/Strength/Tone: Deficits RUE ROM/Strength/Tone Deficits: flaccid RUE- no movement/muscle activation noted (including shoulder elevation) RUE Sensation: Deficits RUE Sensation Deficits: reports feeling "like it's asleep" shoulder and distally RUE Coordination: Deficits RUE Coordination Deficits: flaccid  RLE ROM/Strength/Tone: Deficits RLE ROM/Strength/Tone Deficits: Flaccid on right LE RLE Sensation: Deficits RLE Sensation Deficits: Impaired light touch.  Pt stated "It feels a little numb."    ADLs  Grooming: Wash/dry face;Set up;Supervision/safety Where Assessed - Grooming: Supported sitting Lower  Body Bathing: +2 Total assistance Lower Body Bathing: Patient Percentage: 10% Where Assessed - Lower Body Bathing: Supported sit to stand Upper Body Dressing: Maximal assistance Where Assessed - Upper Body Dressing: Supported sitting Lower Body Dressing: +2 Total assistance Lower Body Dressing: Patient Percentage: 10% Where Assessed - Lower Body Dressing: Supported sit to Pharmacist, hospital: +2 Total assistance Toilet Transfer: Patient Percentage: 70% Statistician Method: Sit to Barista:  (chair with armrests) Toileting - Clothing Manipulation and Hygiene: +2 Total assistance Toileting - Clothing Manipulation and Hygiene: Patient Percentage: 10% Where Assessed - Glass blower/designer Manipulation and Hygiene: Standing Equipment Used: Gait belt Transfers/Ambulation Related to ADLs: +2total(pt=70%) sit to stand from chair with armrests ADL Comments: performed dynamic sitting balance with Min A to return to midline and to prevent pt from falling forward; perform weight shifting in standing- pt pushing to the right with left UE and LE's- able to maintain midline for ~5sec before leaning right again. RLE fluctuated between hyper-extending and buckling during weight shifting- blocked knee to allow weight bearing.    Mobility  Bed Mobility: Not assessed Supine to Sit: 3: Mod assist;HOB flat;With rails Sitting - Scoot to Edge of Bed: 2: Max assist    Transfers  Transfers: Sit  to Stand;Stand to Sit Sit to Stand: 1: +2 Total assist;From chair/3-in-1 Sit to Stand: Patient Percentage: 70% Stand to Sit: 1: +2 Total assist;To chair/3-in-1 Stand to Sit: Patient Percentage: 60% Stand Pivot Transfers: 1: +2 Total assist Stand Pivot Transfers: Patient Percentage: 30%    Ambulation / Gait / Stairs / Wheelchair Mobility  Ambulation/Gait Ambulation/Gait Assistance: Not tested (comment)    Posture / Balance Static Sitting Balance Static Sitting - Balance Support: Feet  supported Static Sitting - Level of Assistance: 4: Min assist;5: Stand by assistance Static Sitting - Comment/# of Minutes: Occasional min (A) to promote upright posture and find midline.  Pt with right lateral lean and able to self correct at times but occasional min (A). Dynamic Sitting Balance Dynamic Sitting - Balance Support: Feet supported;No upper extremity supported Dynamic Sitting - Level of Assistance: 5: Stand by assistance;4: Min assist Dynamic Sitting Balance - Compensations: Occasional min (A) with reaching to right side to prevent LOB to right.  Dynamic Sitting - Balance Activities: Lateral lean/weight shifting;Forward lean/weight shifting;Reaching across midline;Head control activities;Trunk control activities Static Standing Balance Static Standing - Balance Support: Left upper extremity supported Static Standing - Level of Assistance: 1: +2 Total assist Static Standing - Comment/# of Minutes: +2 (A) to maintain midline and prevent right side lateral lean.  Tactile, VCs and manual cues to promote midline.  Pt able to maintain increments of ~10 seconds.  Tactile cues to weight shift to left side especially at hips.  Manual cues to prevent Right knee buckling or hyperextension.  Dynamic Standing Balance Dynamic Standing - Balance Support: Left upper extremity supported;Right upper extremity supported Dynamic Standing - Level of Assistance: 1: +2 Total assist Dynamic Standing - Balance Activities: Lateral lean/weight shifting Dynamic Standing - Comments: +2 (A) to maintain balance with cues for proper weight shift to left and right.  Manual cues to prevent right knee buckling or hyperextension.    Special needs/care consideration BiPAP/CPAP No CPM No Continuous Drip IV NL 75 cc/hr  Dialysis No        Days NO Life Vest NO Oxygen No Special Bed No Trach Size No Wound Vac (area) No      Location No Skin Ecchymotic areas on hip and arm                            Bowel mgmt: Had  BM 10/19/12 Bladder mgmt: Has a catheter to straight drainage Diabetic mgmt No     Previous Home Environment Living Arrangements: Spouse/significant other;Children Lives With: Family (2 sons work during the day; family can arrange 24hr) Available Help at Discharge: Family Type of Home: House Home Layout: One level Home Access: Ramped entrance (back of the house) Bathroom Shower/Tub: Child psychotherapist: Standard Home Care Services: No  Discharge Living Setting Plans for Discharge Living Setting: Patient's home;House;Lives with (comment) (Lives with youngest son.) Type of Home at Discharge: House Discharge Home Layout: One level (steps inside down to den bulit-on area.) Discharge Home Access: Stairs to enter Entrance Stairs-Number of Steps: 1 step a back and 2 steps at front entry. Do you have any problems obtaining your medications?: No  Social/Family/Support Systems Patient Roles: Parent Contact Information: Viann Fish - Dtr [phone] Anticipated Caregiver: Sons and Dtr (2 sons and 1 Dtr are local.) Ability/Limitations of Caregiver: Youngest son lives with patient and not working.  Oldest son stays at night and is retired. Caregiver Availability: 24/7 Discharge Plan Discussed with  Primary Caregiver: Yes Is Caregiver In Agreement with Plan?: Yes Does Caregiver/Family have Issues with Lodging/Transportation while Pt is in Rehab?: No    Goals/Additional Needs Patient/Family Goal for Rehab: PT/OT min/mod A goals, ST S goals Expected length of stay: 2-3 weeks Cultural Considerations: [employer] Dietary Needs: Regular diet Equipment Needs: TBD Pt/Family Agrees to Admission and willing to participate: Yes Program Orientation Provided & Reviewed with Pt/Caregiver Including Roles  & Responsibilities: Yes   Decrease burden of Care through IP rehab admission:  Not applicable  Possible need for SNF placement upon discharge:Yes, but I anticipate family will  provide care and supervision after rehab stay.   Patient Condition: This patient's condition remains as documented in the Consult dated12/05/13, in which the Rehabilitation Physician determined and documented that the patient's condition is appropriate for intensive rehabilitative care in an inpatient rehabilitation facility.  Preadmission Screen Completed By:  Trish Mage, 10/20/2012 10:32 AM ______________________________________________________________________   Discussed status with Dr. Riley Kill on 10/20/12 at 1059 and received telephone approval for admission today.  Admission Coordinator:  Trish Mage, time1100/Date12/06/13

## 2012-10-20 NOTE — H&P (Signed)
Physical Medicine and Rehabilitation Admission H&P  Chief Complaint   Patient presents with   .  Trauma   :  HPI: John Montgomery is a 78 y.o. male who was knocked over by a neighbor's dog on 10/17/12 while out on a walk. Son noticed blood about patient's face and head and was not acting right. His speech started to worsen soon after with inability to speak as well as difficulty using right side. He was admitted for work up and CCT revealed left frontal lobe IPH with edema and mass effect without shift. CT cervical spine with stable spondylosis. Patient was evaluated by Dr. Franky Macho who recommended F/U CCT for monitoring. Therapy evaluations done yesterday and patient noted to have limited by right sided weakness with RLE instability, with difficulty tracking, easy distractibility, and poor awareness of deficits. CIR recommended for progression. F/U CCT with minimal increase in left frontal-parietal hematoma. Patient had an episode of PAF with flutter 12/04 pm and LHC consulted for input. Low dose BB added with monitoring of HR as patient with tendency towards SSS. Recommendations to keep K+ levels closer to 4.0 and resumption of home BP medications. Therapies initiated and patient working on standing balance at EOB. Patient with heavy lean to the right with tendency to push and poor awareness of balance deficits as well as right LE instability. Therapy team recommeding CIR.  ROS  Past Medical History   Diagnosis  Date   .  Hypertension    .  Hyperlipidemia    .  Myocardial infarction     Past Surgical History   Procedure  Date   .  Angioplasty     History reviewed. No pertinent family history.  Social History: Widowed. Walks about 2 miles daily, does housework and cooking. Stopped driving this summer due to vision problems. His youngest son lives with him. He reports that he has never smoked. He has never used smokeless tobacco. He reports that he does not drink alcohol or use illicit drugs.   Allergies:  Allergies   Allergen  Reactions   .  Penicillins      hives    Scheduled Meds:  .  amLODipine  5 mg  Oral  Daily   .  atorvastatin  20 mg  Oral  q1800   .  metoprolol tartrate  12.5 mg  Oral  BID   .  [COMPLETED] metoprolol tartrate  25 mg  Oral  Once   .  pantoprazole  40 mg  Oral  QHS   .  senna-docusate  1 tablet  Oral  BID   .  [DISCONTINUED] simvastatin  40 mg  Oral  q1800    Medications Prior to Admission   Medication  Sig  Dispense  Refill   .  amLODipine (NORVASC) 5 MG tablet  Take 5 mg by mouth daily.     Marland Kitchen  CALCIUM-VITAMIN D PO  Take 1 tablet by mouth daily.     .  Cholecalciferol (VITAMIN D3) 2000 UNITS TABS  Take 1 tablet by mouth daily.     .  metoprolol tartrate (LOPRESSOR) 25 MG tablet  Take 25 mg by mouth daily.     .  Multiple Vitamins-Minerals (EYE VITAMINS PO)  Take 1 tablet by mouth daily.     Marland Kitchen  omeprazole (PRILOSEC) 20 MG capsule  Take 20 mg by mouth daily.     .  pravastatin (PRAVACHOL) 80 MG tablet  Take 80 mg by mouth at bedtime.     Marland Kitchen  ramipril (ALTACE) 10 MG capsule  Take 10 mg by mouth daily.     .  [DISCONTINUED] aspirin 81 MG tablet  Take 81 mg by mouth at bedtime.     .  [DISCONTINUED] clopidogrel (PLAVIX) 75 MG tablet  Take 75 mg by mouth daily.      Home:  Home Living  Lives With: Family (2 sons work during the day; family can arrange 24hr)  Available Help at Discharge: Family  Type of Home: House  Home Access: Ramped entrance (back of the house)  Home Layout: One level  Bathroom Shower/Tub: Heritage manager: Standard  Home Adaptive Equipment: None  Functional History:  Prior Function  Able to Take Stairs?: Yes  Driving: No  Vocation: Other (comment) (("very sharp" per daughter, active, walks daily, social))  Functional Status:  Mobility:  Bed Mobility  Bed Mobility: Not assessed  Supine to Sit: 3: Mod assist;HOB flat;With rails  Sitting - Scoot to Edge of Bed: 2: Max assist  Transfers  Transfers:  Sit to Stand;Stand to Sit  Sit to Stand: 1: +2 Total assist;From chair/3-in-1  Sit to Stand: Patient Percentage: 70%  Stand to Sit: 1: +2 Total assist;To chair/3-in-1  Stand to Sit: Patient Percentage: 60%  Stand Pivot Transfers: 1: +2 Total assist  Stand Pivot Transfers: Patient Percentage: 30%  Ambulation/Gait  Ambulation/Gait Assistance: Not tested (comment)  Stairs: No  Wheelchair Mobility  Wheelchair Mobility: No  ADL:  ADL  Grooming: Therapist, nutritional;Set up;Supervision/safety  Where Assessed - Grooming: Supported sitting  Lower Body Bathing: +2 Total assistance  Where Assessed - Lower Body Bathing: Supported sit to stand  Upper Body Dressing: Maximal assistance  Where Assessed - Upper Body Dressing: Supported sitting  Lower Body Dressing: +2 Total assistance  Where Assessed - Lower Body Dressing: Supported sit to Scientist, research (life sciences): +2 Total assistance  Toilet Transfer Method: Sit to Production manager: (chair with armrests)  Equipment Used: Gait belt  Transfers/Ambulation Related to ADLs: +2total(pt=70%) sit to stand from chair with armrests  ADL Comments: performed dynamic sitting balance with Min A to return to midline and to prevent pt from falling forward; perform weight shifting in standing- pt pushing to the right with left UE and LE's- able to maintain midline for ~5sec before leaning right again. RLE fluctuated between hyper-extending and buckling during weight shifting- blocked knee to allow weight bearing.  Cognition:  Cognition  Overall Cognitive Status: Impaired  Arousal/Alertness: Awake/alert  Orientation Level: Oriented X4  Attention: Focused  Focused Attention: Impaired  Focused Attention Impairment: Verbal basic;Functional basic  Awareness: Impaired  Awareness Impairment: Intellectual impairment  Problem Solving: Impaired  Problem Solving Impairment: Verbal basic;Functional basic  Safety/Judgment: Impaired  Cognition  Overall Cognitive  Status: Impaired  Area of Impairment: Attention;Following commands;Awareness of deficits;Problem solving  Arousal/Alertness: Awake/alert  Orientation Level: Appears intact for tasks assessed  Behavior During Session: [facility] for tasks performed  Current Attention Level: Sustained  Following Commands: Follows multi-step commands consistently  Awareness of Deficits: Pt stated "I thought I could walk to that chair. I guess I was wrong." (pt assumed he would be able to ambulate without movement in right LE however able to recgonize after attempting ambulation pt is unable.)  Problem Solving: Slow process at times  Cognition - Other Comments: Pt. continues to need increased time to follow commands and cues for staying focused on task.  Blood pressure 136/49, pulse 85, temperature 98.2 F (36.8 C), temperature source Oral, resp. rate  14, height 5\' 5"  (1.651 m), weight 55.1 kg (121 lb 7.6 oz), SpO2 98.00%.  Physical Exam  Nursing note and vitals reviewed.  Constitutional: He is oriented to person, place, and time.  Frail appearing elderly male.  HENT:  Head: Normocephalic.  Mid occiput area with abrasion and dried blood.  Eyes: Pupils are equal, round, and reactive to light.  Neck: Normal range of motion.  Cardiovascular: Normal rate and regular rhythm.  Pulmonary/Chest: Effort normal and breath sounds normal.  Abdominal: Soft. Bowel sounds are normal.  Musculoskeletal: He exhibits no edema.  Neurological: He is alert and oriented to person, place, and time.   . Pleasant and appropriate. Follows commands without difficulty. Soft speech without dysarthria. Right sided weakness with intermittent flexor spasms of RLE. RUE is 0/5 deltoid, biceps, triceps, grip 4/5. RLE is 1 at RHF, RKE and 0/5 at ankle. Senses pain throughout right side.right central 7 and some tongue deviation, but other CN normal. DTR's are 2+ right elbow, knee. Toes non reactive. Cognitively he displays good insight and awareness. Id's  simple objects without issue. Occasionally struggles with longer phrases and sentences. Skin: Skin is warm and dry. A few abrasions on scalp. Psychiatric: He has a normal mood and affect. His speech is normal. Thought content normal. His mood appears not anxious. Cognition and memory are normal.   Results for orders placed during the hospital encounter.   CALCIUM, IONIZED Status: Normal    Collection Time    10/18/12 9:04 PM   Component  Value  Range  Comment    Calcium, Ion  1.22  1.12 - 1.32 mmol/L    BASIC METABOLIC PANEL Status: Abnormal    Collection Time    10/20/12 5:05 AM   Component  Value  Range  Comment    Sodium  138  135 - 145 mEq/L     Potassium  3.7  3.5 - 5.1 mEq/L     Chloride  105  96 - 112 mEq/L     CO2  23  19 - 32 mEq/L     Glucose, Bld  84  70 - 99 mg/dL     BUN  16  6 - 23 mg/dL     Creatinine, Ser  1.61  0.50 - 1.35 mg/dL     Calcium  8.8  8.4 - 10.5 mg/dL     GFR calc non Af Amer  76 (*)  >90 mL/min     GFR calc Af Amer  88 (*)  >90 mL/min    TSH Status: Normal    Collection Time    10/20/12 5:05 AM   Component  Value  Range  Comment    TSH  1.813  0.350 - 4.500 uIU/mL    CBC Status: Abnormal    Collection Time    10/20/12 5:05 AM   Component  Value  Range  Comment    WBC  7.6  4.0 - 10.5 K/uL     RBC  3.31 (*)  4.22 - 5.81 MIL/uL     Hemoglobin  10.4 (*)  13.0 - 17.0 g/dL     HCT  09.6 (*)  04.5 - 52.0 %     MCV  89.1  78.0 - 100.0 fL     MCH  31.4  26.0 - 34.0 pg     MCHC  35.3  30.0 - 36.0 g/dL     RDW  40.9  81.1 - 91.4 %     Platelets  183  150 - 400 K/uL    No results found.  Post Admission Physician Evaluation:   1. Functional deficits secondary to left frontal lobe ICH after fall. 2. Patient is admitted to receive collaborative, interdisciplinary care between the physiatrist, rehab nursing staff, and therapy team. 3. Patient's level of medical complexity and substantial therapy needs in context of that medical necessity cannot be provided at  a lesser intensity of care such as a SNF. 4. Patient has experienced substantial functional loss from his/her baseline which was documented above under the "Functional History" and "Functional Status" headings. Judging by the patient's diagnosis, physical exam, and functional history, the patient has potential for functional progress which will result in measurable gains while on inpatient rehab. These gains will be of substantial and practical use upon discharge in facilitating mobility and self-care at the household level. 5. Physiatrist will provide 24 hour management of medical needs as well as oversight of the therapy plan/treatment and provide guidance as appropriate regarding the interaction of the two. 6. 24 hour rehab nursing will assist with bladder management, bowel management, safety, skin/wound care, disease management, medication administration, pain management and patient education and help integrate therapy concepts, techniques,education, etc. 7. PT will assess and treat for: Lower extremity strength, range of motion, stamina, balance, functional mobility, safety, adaptive techniques and equipment, NMR, spasticity, ROM. Goals are: min assist. 8. OT will assess and treat for: ADL's, functional mobility, safety, upper extremity strength, adaptive techniques and equipment, NMR, spasticity, education. Goals are: min to mod assist. 9. SLP will assess and treat for: speech, language, swallowing. Goals are:  Mod I. 10. Case Management and Social Worker will assess and treat for psychological issues and discharge planning. 11. Team conference will be held weekly to assess progress toward goals and to determine barriers to discharge. 12. Patient will receive at least 3 hours of therapy per day at least 5 days per week. 13. ELOS: 3 weeks Prognosis: excellent Medical Problem List and Plan:  1. DVT Prophylaxis/Anticoagulation: Mechanical: Sequential compression devices, below knee Bilateral lower  extremities  2. Pain Management: N/A  3. Mood: appropriate. Will monitor for now.  4. Neuropsych: This patient is capable of making decisions on his/her own behalf.  5. PAF: resolved. Continue low dose BB with close monitoring of HR for bradycardia as with tendency towards SSS. Keep K+ levels around 4.0.  6. HTN: Monitor with bid checks. Norvasc resumed today.  7. ABLA: Will check follow up labs on Monday.  8. RLE spasm: ?low dose meds on a prn basis only for now  Ivory Broad, MD 10/20/12

## 2012-10-20 NOTE — Progress Notes (Signed)
Rehab admissions - Evaluated for possible admission.  I spoke with patient.  His youngest son lives with him.  His oldest son spends the night with him.  He also has a Dtr here locally.  Patient is interested in inpatient rehab.  Bed available and can admit to inpatient rehab today.  Call me for questions.  #[phone]

## 2012-10-20 NOTE — Progress Notes (Signed)
Agree with PT treatment note.  Cris Talavera, PT DPT [phone]  

## 2012-10-20 NOTE — Discharge Summary (Signed)
Physician Discharge Summary  Patient ID: John Montgomery MRN: [id number] DOB/AGE: December 21, 1920 78 y.o.  Admit date: 10/16/2012 Discharge date: 10/20/2012  Admission Diagnoses:Closed head injury, Intracerebral hematoma  Discharge Diagnoses:  Principal Problem:  *PAF (paroxysmal atrial fibrillation) Active Problems:  First degree AV block  SDH (subdural hematoma)  HTN (hypertension)  CAD, RCA PTCA '98, RCA DES '06. Low risk Nuc 2011  Dyslipidemia  PVD, moderate RICA disease  Diastolic dysfunction, grade 1 with an EF of 50-55% 2011   Discharged Condition: good  Hospital Course: John Montgomery was admitted to the hospital after falling sustaining a head injury. He is paretic on his right side secondary to an intracerebral hematoma in the motor area on the left. His physical exam has improved significantly since admission. Scalp laceration is healing well. He will be discharged to inpatient rehabilitation at [facility].   Consults: cardiology  Significant Diagnostic Studies: ekg  Treatments: observation  Discharge Exam: Blood pressure 136/49, pulse 85, temperature 98.2 F (36.8 C), temperature source Oral, resp. rate 14, height 5\' 5"  (1.651 m), weight 55.1 kg (121 lb 7.6 oz), SpO2 98.00%. General appearance: alert, cooperative and appears stated age Neurologic: Mental status: Alert, oriented, thought content appropriate Cranial nerves: normal Motor: weakness on the right side.  Disposition:      Medication List     As of 10/20/2012 10:50 AM    STOP taking these medications         aspirin 81 MG tablet      clopidogrel 75 MG tablet   Commonly known as: PLAVIX      TAKE these medications         amLODipine 5 MG tablet   Commonly known as: NORVASC   Take 5 mg by mouth daily.      CALCIUM-VITAMIN D PO   Take 1 tablet by mouth daily.      EYE VITAMINS PO   Take 1 tablet by mouth daily.      metoprolol tartrate 25 MG tablet   Commonly known as: LOPRESSOR   Take 25 mg  by mouth daily.      omeprazole 20 MG capsule   Commonly known as: PRILOSEC   Take 20 mg by mouth daily.      pravastatin 80 MG tablet   Commonly known as: PRAVACHOL   Take 80 mg by mouth at bedtime.      ramipril 10 MG capsule   Commonly known as: ALTACE   Take 10 mg by mouth daily.      Vitamin D3 2000 UNITS Tabs   Take 1 tablet by mouth daily.         Signed: Brittnae Aschenbrenner L 10/20/2012, 10:50 AM

## 2012-10-21 ENCOUNTER — Inpatient Hospital Stay (HOSPITAL_COMMUNITY): Payer: Medicare Other | Admitting: *Deleted

## 2012-10-21 ENCOUNTER — Inpatient Hospital Stay (HOSPITAL_COMMUNITY): Payer: Medicare Other | Admitting: Physical Therapy

## 2012-10-21 ENCOUNTER — Inpatient Hospital Stay (HOSPITAL_COMMUNITY): Payer: Medicare Other | Admitting: Speech Pathology

## 2012-10-21 NOTE — Evaluation (Addendum)
Speech Language Pathology Assessment and Plan  Patient Details  Name: John Montgomery MRN: [id number] Date of Birth: April 21, 1921  SLP Diagnosis: Cognitive Impairments  Rehab Potential: Excellent ELOS: 1 week for cognitive recovery    Today's Date: 10/21/2012 Time: 1000-1055 Time Calculation (min): 55 min  Skilled Therapeutic Intervention: Administered cognitive-linguistic evaluation. Please see below for details. Educated pt and son on current cognitive function and goals of skilled SLP intervention. Both verbalized understanding.     Problem List:  Patient Active Problem List  Diagnosis  . PAF (paroxysmal atrial fibrillation)  . First degree AV block  . SDH (subdural hematoma)  . HTN (hypertension)  . CAD, RCA PTCA '98, RCA DES '06. Low risk Nuc 2011  . Dyslipidemia  . PVD, moderate RICA disease  . Diastolic dysfunction, grade 1 with an EF of 50-55% 2011   Past Medical History:  Past Medical History  Diagnosis Date  . Hypertension   . Hyperlipidemia   . Myocardial infarction    Past Surgical History:  Past Surgical History  Procedure Date  . Angioplasty     Assessment / Plan / Recommendation Clinical Impression  Pt is an 78 y.o. male who was knocked over by a neighbor's dog on 10/17/12 while out on a walk. Son noticed blood about patient's face and head and was not acting right. His speech started to worsen soon after with inability to speak as well as difficulty using right side. He was admitted for work up and CCT revealed left frontal lobe IPH with edema and mass effect without shift. CT cervical spine with stable spondylosis. Patient was evaluated by Dr. Franky Macho who recommended F/U CCT for monitoring. Therapy evaluations done and patient noted to be limited by right sided weakness with RLE instability, with difficulty tracking, easy distractibility, and poor awareness of deficits. CIR recommended for progression. F/U CCT with minimal increase in left frontal-parietal  hematoma. Patient had an episode of PAF with flutter 12/04 pm and LHC consulted for input. Low dose BB added with monitoring of HR as patient with tendency towards SSS. Recommendations to keep K+ levels closer to 4.0 and resumption of home BP medications. Pt transferred to CIR 10/20/12 and demonstrates behaviors consistent with a Rancho Level VII (automatic-appropriate) and requires supervision verbal cues for recall of newly learned information and complex problem solving. Pt also demonstrates minimal word-finding difficulty with complex conversations. Pt would benefit from skilled SLP intervention to maximize cognitive recovery and overall independence for eventual discharge home.     SLP Assessment  Patient will need skilled Speech Lanaguage Pathology Services during CIR admission    Recommendations  Follow up Recommendations:  (TBD) Equipment Recommended: None recommended by SLP    SLP Frequency 1-2 X/day, 30-60 minutes   SLP Treatment/Interventions Cognitive remediation/compensation;Cueing hierarchy;Environmental controls;Functional tasks;Internal/external aids;Patient/family education;Therapeutic Activities;Speech/Language facilitation    Pain Pain Assessment Pain Assessment: No/denies pain Pain Score:   3 Pain Type: Acute pain Pain Location: Leg Pain Orientation: Right;Posterior Pain Descriptors: Stabbing;Spasm (very sudden, brief and only 2-3x/day) Pain Intervention(s): Medication (See eMAR);Repositioned Prior Functioning Type of Home: House Lives With: Son Available Help at Discharge: Available 24 hours/day  Short Term Goals: Week 1: SLP Short Term Goal 1 (Week 1): Pt will complete complex problem solving tasks with Mod I.  SLP Short Term Goal 2 (Week 1): Pt will demonstrate recall of newly learned information with utilization of compensatory strategies with Mod I.  SLP Short Term Goal 3 (Week 1): Pt will verbalize abstract thoughts with Mod  I for word-finding and utilization of  word-finding strategies  See FIM for current functional status Refer to Care Plan for Long Term Goals  Recommendations for other services: None  Discharge Criteria: Patient will be discharged from SLP if patient refuses treatment 3 consecutive times without medical reason, if treatment goals not met, if there is a change in medical status, if patient makes no progress towards goals or if patient is discharged from hospital.  The above assessment, treatment plan, treatment alternatives and goals were discussed and mutually agreed upon: by patient and by family  Kamariyah Timberlake 10/21/2012, 3:14 PM

## 2012-10-21 NOTE — Progress Notes (Signed)
Patient ID: John Montgomery, male   DOB: 03/03/1921, 78 y.o.   MRN: [id number]  12/7.  Admitted to the rehabilitation unit yesterday with history of  left frontoparietal hematoma.  No complaints ENT unremarkable. Chest clear to auscultation. Cardiovascular normal heart sounds no murmurs. Abdomen benign no distention or tenderness. Extremities negative neuro right sided weakness.  BP Readings from Last 3 Encounters:  10/21/12 176/54  10/20/12 166/76    H&P signed by Ranelle Oyster, MD at 10/20/12 1545     Author: Ranelle Oyster, MD Service: Physical Medicine and Rehabilitation Author Type: Physician    Filed: 10/20/12 1545 Note Time: 10/20/12 1524           Physical Medicine and Rehabilitation Admission H&P   Chief Complaint    Patient presents with    .   Trauma     :   HPI: John Montgomery is a 78 y.o. male who was knocked over by a neighbor's dog on 10/17/12 while out on a walk. Son noticed blood about patient's face and head and was not acting right. His speech started to worsen soon after with inability to speak as well as difficulty using right side. He was admitted for work up and CCT revealed left frontal lobe IPH with edema and mass effect without shift. CT cervical spine with stable spondylosis. Patient was evaluated by Dr. Franky Macho who recommended F/U CCT for monitoring. Therapy evaluations done yesterday and patient noted to have limited by right sided weakness with RLE instability, with difficulty tracking, easy distractibility, and poor awareness of deficits. CIR recommended for progression. F/U CCT with minimal increase in left frontal-parietal hematoma. Patient had an episode of PAF with flutter 12/04 pm and LHC consulted for input. Low dose BB added with monitoring of HR as patient with tendency towards SSS. Recommendations to keep K+ levels closer to 4.0 and resumption of home BP medications. Therapies initiated and patient working on standing balance at EOB. Patient with heavy  lean to the right with tendency to push and poor awareness of balance deficits as well as right LE instability. Therapy team recommeding CIR.   ROS   Past Medical History    Diagnosis   Date    .   Hypertension      .   Hyperlipidemia      .   Myocardial infarction          Past Surgical History    Procedure   Date    .   Angioplasty         History reviewed. No pertinent family history.   Social History: Widowed. Walks about 2 miles daily, does housework and cooking. Stopped driving this summer due to vision problems. His youngest son lives with him. He reports that he has never smoked. He has never used smokeless tobacco. He reports that he does not drink alcohol or use illicit drugs.   Allergies:   Allergies    Allergen   Reactions    .   Penicillins          hives       Scheduled Meds:   .   amLODipine   5 mg   Oral   Daily    .   atorvastatin   20 mg   Oral   q1800    .   metoprolol tartrate   12.5 mg   Oral   BID    .   [COMPLETED] metoprolol tartrate  25 mg   Oral   Once    .   pantoprazole   40 mg   Oral   QHS    .   senna-docusate   1 tablet   Oral   BID    .   [DISCONTINUED] simvastatin   40 mg   Oral   q1800        Medications Prior to Admission    Medication   Sig   Dispense   Refill    .   amLODipine (NORVASC) 5 MG tablet   Take 5 mg by mouth daily.        Marland Kitchen   CALCIUM-VITAMIN D PO   Take 1 tablet by mouth daily.        .   Cholecalciferol (VITAMIN D3) 2000 UNITS TABS   Take 1 tablet by mouth daily.        .   metoprolol tartrate (LOPRESSOR) 25 MG tablet   Take 25 mg by mouth daily.        .   Multiple Vitamins-Minerals (EYE VITAMINS PO)   Take 1 tablet by mouth daily.        Marland Kitchen   omeprazole (PRILOSEC) 20 MG capsule   Take 20 mg by mouth daily.        .   pravastatin (PRAVACHOL) 80 MG tablet   Take 80 mg by mouth at bedtime.        .   ramipril (ALTACE) 10 MG capsule   Take 10 mg by mouth daily.        .   [DISCONTINUED] aspirin 81 MG tablet   Take 81 mg by  mouth at bedtime.        .   [DISCONTINUED] clopidogrel (PLAVIX) 75 MG tablet   Take 75 mg by mouth daily.           Home:   Home Living   Lives With: Family (2 sons work during the day; family can arrange 24hr)   Available Help at Discharge: Family   Type of Home: House   Home Access: Ramped entrance (back of the house)   Home Layout: One level   Bathroom Shower/Tub: Arts development officer: Standard   Home Adaptive Equipment: None   Functional History:   Prior Function   Able to Take Stairs?: Yes   Driving: No   Vocation: Other (comment) (("very sharp" per daughter, active, walks daily, social))   Functional Status:   Mobility:   Bed Mobility   Bed Mobility: Not assessed   Supine to Sit: 3: Mod assist;HOB flat;With rails   Sitting - Scoot to Edge of Bed: 2: Max assist   Transfers   Transfers: Sit to Stand;Stand to Sit   Sit to Stand: 1: +2 Total assist;From chair/3-in-1   Sit to Stand: Patient Percentage: 70%   Stand to Sit: 1: +2 Total assist;To chair/3-in-1   Stand to Sit: Patient Percentage: 60%   Stand Pivot Transfers: 1: +2 Total assist   Stand Pivot Transfers: Patient Percentage: 30%   Ambulation/Gait   Ambulation/Gait Assistance: Not tested (comment)   Stairs: No   Wheelchair Mobility   Wheelchair Mobility: No   ADL:   ADL   Grooming: Therapist, nutritional;Set up;Supervision/safety   Where Assessed - Grooming: Supported sitting   Lower Body Bathing: +2 Total assistance   Where Assessed - Lower Body Bathing: Supported sit to stand   Upper Body Dressing: Maximal assistance   Where Assessed -  Upper Body Dressing: Supported sitting   Lower Body Dressing: +2 Total assistance   Where Assessed - Lower Body Dressing: Supported sit to Database administrator: +2 Total assistance   Toilet Transfer Method: Sit to Brewing technologist: (chair with armrests)   Equipment Used: Gait belt   Transfers/Ambulation Related to ADLs: +2total(pt=70%) sit  to stand from chair with armrests   ADL Comments: performed dynamic sitting balance with Min A to return to midline and to prevent pt from falling forward; perform weight shifting in standing- pt pushing to the right with left UE and LE's- able to maintain midline for ~5sec before leaning right again. RLE fluctuated between hyper-extending and buckling during weight shifting- blocked knee to allow weight bearing.   Cognition:   Cognition   Overall Cognitive Status: Impaired   Arousal/Alertness: Awake/alert   Orientation Level: Oriented X4   Attention: Focused   Focused Attention: Impaired   Focused Attention Impairment: Verbal basic;Functional basic   Awareness: Impaired   Awareness Impairment: Intellectual impairment   Problem Solving: Impaired   Problem Solving Impairment: Verbal basic;Functional basic   Safety/Judgment: Impaired   Cognition   Overall Cognitive Status: Impaired   Area of Impairment: Attention;Following commands;Awareness of deficits;Problem solving   Arousal/Alertness: Awake/alert   Orientation Level: Appears intact for tasks assessed   Behavior During Session: [facility] for tasks performed   Current Attention Level: Sustained   Following Commands: Follows multi-step commands consistently   Awareness of Deficits: Pt stated "I thought I could walk to that chair. I guess I was wrong." (pt assumed he would be able to ambulate without movement in right LE however able to recgonize after attempting ambulation pt is unable.)   Problem Solving: Slow process at times   Cognition - Other Comments: Pt. continues to need increased time to follow commands and cues for staying focused on task.   Blood pressure 136/49, pulse 85, temperature 98.2 F (36.8 C), temperature source Oral, resp. rate 14, height 5\' 5"  (1.651 m), weight 55.1 kg (121 lb 7.6 oz), SpO2 98.00%.   Physical Exam  Nursing note and vitals reviewed.   Constitutional: He is oriented to person, place, and time.  Frail  appearing elderly male.  HENT:   Head: Normocephalic.  Mid occiput area with abrasion and dried blood.   Eyes: Pupils are equal, round, and reactive to light.   Neck: Normal range of motion.   Cardiovascular: Normal rate and regular rhythm.   Pulmonary/Chest: Effort normal and breath sounds normal.   Abdominal: Soft. Bowel sounds are normal.  Musculoskeletal: He exhibits no edema.  Neurological: He is alert and oriented to person, place, and time.  . Pleasant and appropriate. Follows commands without difficulty. Soft speech without dysarthria. Right sided weakness with intermittent flexor spasms of RLE. RUE is 0/5 deltoid, biceps, triceps, grip 4/5. RLE is 1 at RHF, RKE and 0/5 at ankle. Senses pain throughout right side.right central 7 and some tongue deviation, but other CN normal. DTR's are 2+ right elbow, knee. Toes non reactive. Cognitively he displays good insight and awareness. Id's simple objects without issue. Occasionally struggles with longer phrases and sentences. Skin: Skin is warm and dry. A few abrasions on scalp. Psychiatric: He has a normal mood and affect. His speech is normal. Thought content normal. His mood appears not anxious. Cognition and memory are normal.      Results for orders placed during the hospital encounter.    CALCIUM, IONIZED Status: Normal  Collection Time      10/18/12 9:04 PM    Component   Value   Range   Comment      Calcium, Ion   1.22   1.12 - 1.32 mmol/L      BASIC METABOLIC PANEL Status: Abnormal      Collection Time      10/20/12 5:05 AM    Component   Value   Range   Comment      Sodium   138   135 - 145 mEq/L        Potassium   3.7   3.5 - 5.1 mEq/L        Chloride   105   96 - 112 mEq/L        CO2   23   19 - 32 mEq/L        Glucose, Bld   84   70 - 99 mg/dL        BUN   16   6 - 23 mg/dL        Creatinine, Ser   0.83   0.50 - 1.35 mg/dL        Calcium   8.8   8.4 - 10.5 mg/dL        GFR calc non Af Amer   76 (*)   >90 mL/min         GFR calc Af Amer   88 (*)   >90 mL/min      TSH Status: Normal      Collection Time      10/20/12 5:05 AM    Component   Value   Range   Comment      TSH   1.813   0.350 - 4.500 uIU/mL      CBC Status: Abnormal      Collection Time      10/20/12 5:05 AM    Component   Value   Range   Comment      WBC   7.6   4.0 - 10.5 K/uL        RBC   3.31 (*)   4.22 - 5.81 MIL/uL        Hemoglobin   10.4 (*)   13.0 - 17.0 g/dL        HCT   16.1 (*)   39.0 - 52.0 %        MCV   89.1   78.0 - 100.0 fL        MCH   31.4   26.0 - 34.0 pg        MCHC   35.3   30.0 - 36.0 g/dL        RDW   09.6   04.5 - 15.5 %        Platelets   183   150 - 400 K/uL       No results found.   Post Admission Physician Evaluation:    Functional deficits secondary to left frontal lobe ICH after fall. Patient is admitted to receive collaborative, interdisciplinary care between the physiatrist, rehab nursing staff, and therapy team. Patient's level of medical complexity and substantial therapy needs in context of that medical necessity cannot be provided at a lesser intensity of care such as a SNF. Patient has experienced substantial functional loss from his/her baseline which was documented above under the "Functional History" and "Functional Status" headings. Judging by the patient's diagnosis, physical exam, and functional history, the patient  has potential for functional progress which will result in measurable gains while on inpatient rehab. These gains will be of substantial and practical use upon discharge in facilitating mobility and self-care at the household level. Physiatrist will provide 24 hour management of medical needs as well as oversight of the therapy plan/treatment and provide guidance as appropriate regarding the interaction of the two. 24 hour rehab nursing will assist with bladder management, bowel management, safety, skin/wound care, disease management, medication administration, pain management and patient  education and help integrate therapy concepts, techniques,education, etc. PT will assess and treat for: Lower extremity strength, range of motion, stamina, balance, functional mobility, safety, adaptive techniques and equipment, NMR, spasticity, ROM. Goals are: min assist. OT will assess and treat for: ADL's, functional mobility, safety, upper extremity strength, adaptive techniques and equipment, NMR, spasticity, education. Goals are: min to mod assist. SLP will assess and treat for: speech, language, swallowing. Goals are:  Mod I. Case Management and Social Worker will assess and treat for psychological issues and discharge planning. Team conference will be held weekly to assess progress toward goals and to determine barriers to discharge. Patient will receive at least 3 hours of therapy per day at least 5 days per week. ELOS: 3 weeks Prognosis: excellent Medical Problem List and Plan:   1. DVT Prophylaxis/Anticoagulation: Mechanical: Sequential compression devices, below knee Bilateral lower extremities   2. Pain Management: N/A   3. Mood: appropriate. Will monitor for now.   4. Neuropsych: This patient is capable of making decisions on his/her own behalf.   5. PAF: resolved. Continue low dose BB with close monitoring of HR for bradycardia as with tendency towards SSS. Keep K+ levels around 4.0.   6. HTN: Monitor with bid checks. Norvasc resumed today.   7. ABLA: Will check follow up labs on Monday.   8. RLE spasm: ?low dose meds on a prn basis only for now   Ivory Broad, MD 10/20/12

## 2012-10-21 NOTE — Evaluation (Signed)
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 PAF (paroxysmal atrial fibrillation)  . First degree AV block  . SDH (subdural hematoma)  . HTN (hypertension)  . CAD, RCA PTCA '98, RCA DES '06. Low risk Nuc 2011  . Dyslipidemia  . PVD, moderate RICA disease  . Diastolic dysfunction, grade 1 with an EF of 50-55% 2011    Past Medical History:  Past Medical History  Diagnosis Date  . Hypertension   . Hyperlipidemia   . Myocardial infarction    Past Surgical History:  Past Surgical History  Procedure Date  . Angioplasty     Assessment & Plan Clinical Impression:HPI: John Montgomery is a 78 y.o. male who was knocked over by a neighbor's dog on 10/17/12 while out on a walk. Son noticed blood about patient's face and head and was not acting right. His speech started to worsen soon after with inability to speak as well as difficulty using right side. He was admitted for work up and CCT revealed left frontal lobe IPH with edema and mass effect without shift. CT cervical spine with stable spondylosis. Patient was evaluated by Dr. Franky Macho who recommended F/U CCT for monitoring. Therapy evaluations done yesterday and patient noted to have limited by right sided weakness with RLE instability, with difficulty tracking, easy distractibility, and poor awareness of deficits. CIR recommended for progression. F/U CCT with minimal increase in left frontal-parietal hematoma. Patient had an episode of PAF with flutter 12/04 pm and LHC consulted for input. Low dose BB added with monitoring of HR as patient with tendency towards SSS. Recommendations to keep K+  levels closer to 4.0 and resumption of home BP medications. Therapies initiated and patient working on standing balance at EOB. Patient with heavy lean to the right with tendency to push and poor awareness of balance deficits as well as right LE instability. Therapy team recommeding CIR.  ROS   Patient transferred to CIR on 10/20/2012 .    Patient currently requires total with basic self-care skills secondary to muscle paralysis and decreased visual acuity.  Prior to hospitalization, patient could complete BADL with no assistance.  Patient will benefit from skilled intervention to decrease level of assist with basic self-care skills and increase independence with basic self-care skills prior to discharge home with care partner.  Anticipate patient will require 24 hour supervision and follow up home health.  OT - End of Session Activity Tolerance: Tolerates 30+ min activity with multiple rests Endurance Deficit: Yes OT Assessment Rehab Potential: Good Barriers to Discharge: None OT Plan OT Frequency: 2-3 X/day, 60-90 minutes Estimated Length of Stay: 2-3 weeks OT Treatment/Interventions: Balance/vestibular training;Discharge planning;DME/adaptive equipment instruction;Functional mobility training;Neuromuscular re-education;Patient/family education;Self Care/advanced ADL retraining;Splinting/orthotics;Therapeutic Activities;Therapeutic Exercise;UE/LE Strength taining/ROM;UE/LE Coordination activities;Wheelchair propulsion/positioning  OT Evaluation Precautions/Restrictions  Precautions Precautions: Fall Precaution Comments: Strong lateral lean to Rt Restrictions Weight Bearing Restrictions: No General:  Pt. has RUE instability and medial winging on scapula.  He has grip and release of hand but no other motor movements noticed.       Pain:  3/10  Right hip   Home Living/Prior Functioning Home Living Lives With: Family Available Help at Discharge: Available  24 hours/day (son, Smitty Cords is  avalible ) Type of Home: House Home Access: Ramped entrance;Stairs to enter Entrance Stairs-Number of Steps: 1 Entrance Stairs-Rails: None Home Layout: One level Bathroom Shower/Tub: Tub/shower unit;Curtain (regular shower ) Bathroom Toilet: Standard Bathroom Accessibility: Yes Home Adaptive Equipment: None IADL History Homemaking Responsibilities: Yes Meal Prep Responsibility: Primary Laundry Responsibility: Primary Cleaning Responsibility: Secondary Bill Paying/Finance Responsibility: Secondary Shopping Responsibility: Secondary Current License: No Mode of Transportation: Car Occupation: Retired (worked at [employer] for 18 years) Prior Function Level of Independence: Independent with basic ADLs;Independent with homemaking with ambulation;Independent with gait Able to Take Stairs?: Yes Driving: No Leisure: Hobbies-no Comments:  (eating, watch tv) ADL   Vision/Perception  Vision - History Baseline Vision: Wears glasses only for reading Visual History: Macular degeneration Patient Visual Report: No change from baseline  Cognition Arousal/Alertness: Awake/alert Orientation Level: Oriented X4 Attention: Focused Focused Attention: Appears intact Focused Attention Impairment: Verbal basic;Functional basic Awareness: Impaired Awareness Impairment: Intellectual impairment Problem Solving: Impaired Safety/Judgment: Impaired Sensation Sensation Light Touch: Appears Intact Coordination Gross Motor Movements are Fluid and Coordinated: No Fine Motor Movements are Fluid and Coordinated: No (RUE is brunnstrom stage) Coordination and Movement Description:  (RUE has  gross grasp and release ) Motor  Motor Motor: Hemiplegia;Abnormal postural alignment and control;Motor impersistence Motor - Skilled Clinical Observations:  (RUE gross grasp and release) Mobility  Bed Mobility Bed Mobility: Rolling Right;Sitting - Scoot to Edge of Bed Rolling Right: 2: Max assist Rolling Right  Details: Tactile cues for posture;Tactile cues for placement;Tactile cues for weight bearing;Verbal cues for technique;Manual facilitation for placement;Manual facilitation for weight bearing Supine to Sit: 1: +2 Total assist Supine to Sit: Patient Percentage: 50% Transfers Sit to Stand: 1: +2 Total assist Sit to Stand Details: Manual facilitation for weight shifting;Manual facilitation for placement;Manual facilitation for weight bearing  Trunk/Postural Assessment  Cervical Assessment Cervical Assessment: Exceptions to [facility] (forward head) Postural Control Postural Control: Deficits on evaluation (leans to right )  Balance Balance Balance Assessed: Yes Static Sitting Balance Static Sitting - Balance Support: Left upper extremity supported;Feet supported Static Sitting - Level of Assistance: 1: +1 Total assist (unsupported sitting EOB) Static Sitting - Comment/# of Minutes:  (R lateral leaning ) Dynamic Sitting Balance Dynamic Sitting - Balance Support: Left upper extremity supported;Feet supported;During functional activity Dynamic Sitting - Level of Assistance: 1: +1 Total assist Dynamic Sitting Balance - Compensations: Occasional min (A) with reaching to right side to prevent LOB to right.  (max cues to maintain midline EOB) Dynamic Sitting - Balance Activities: Forward lean/weight shifting;Trunk control activities;Reaching for objects Static Standing Balance Static Standing - Balance Support: During functional activity;Left upper extremity supported Static Standing - Level of Assistance: 1: +2 Total assist Extremity/Trunk Assessment RUE Assessment RUE Assessment: Exceptions to [facility] RUE Tone RUE Tone Comments:  (decreased muscle tone) LUE Assessment LUE Assessment: Within Functional Limits  See FIM for current functional status Refer to Care Plan for Long Term Goals  Recommendations for other services: None  Discharge Criteria: Patient will be discharged from OT if patient  refuses treatment 3 consecutive times without medical reason, if treatment goals not met, if there is a change in medical status, if patient makes no progress towards goals or if patient is discharged from hospital.  The above assessment, treatment plan, treatment alternatives and goals were discussed and mutually agreed upon: by patient and by family  Humberto Seals 10/21/2012, 1:07 PM

## 2012-10-21 NOTE — Plan of Care (Signed)
Overall Plan of Care [facility]) Patient Details Name: John Montgomery MRN: [id number] DOB: 23-Mar-1921  Diagnosis:  TBI  Primary Diagnosis:    SDH (subdural hematoma) Co-morbidities: htn, MI  Functional Problem List  Patient demonstrates impairments in the following areas: Balance, Cognition, Endurance, Motor, Safety and Vision  Basic ADL's: grooming, bathing, dressing and toileting Advanced ADL's: simple meal preparation  Transfers:  bed mobility, bed to chair, toilet, tub/shower, car and furniture Locomotion:  ambulation, wheelchair mobility and stairs  Additional Impairments:  Communication  expression and Social Cognition   problem solving and memory  Anticipated Outcomes Item Anticipated Outcome  Eating/Swallowing    Basic self-care  supervision  Tolieting  supervision  Bowel/Bladder    Transfers  S/Mod-I    Locomotion  S/Mod-I 3 stairs with min-A  Communication  Mod I  Cognition  Mod I   Pain    Safety/Judgment  Mod I  Other     Therapy Plan:  PT Frequency 1-2X/day, 60-90 minutes OT Frequency: 2-3 X/day, 60-90 minutes SLP Frequency: 1-2 X/day, 30-60 minutes   Team Interventions: Item RN PT OT SLP SW TR Other  Self Care/Advanced ADL Retraining   x      Neuromuscular Re-Education  x x      Therapeutic Activities  x x x     UE/LE Strength Training/ROM  x x      UE/LE Coordination Activities  x x      Visual/Perceptual Remediation/Compensation   x      DME/Adaptive Equipment Instruction  x x      Therapeutic Exercise  x x      Balance/Vestibular Training  x x      Patient/Family Education  x x x     Cognitive Remediation/Compensation   x x     Functional Mobility Training  x x      Ambulation/Gait Training  x       Stair Training  x       Wheelchair Propulsion/Positioning  x x      Functional Nurse, learning disability Facilitation    x     Bladder  Management         Bowel Management         Disease Management/Prevention         Pain Management         Medication Management         Skin Care/Wound Management   x      Splinting/Orthotics         Discharge Planning    x     Psychosocial Support    x                        Team Discharge Planning: Destination:  Home Projected Follow-up:  OT and Home Health Projected Equipment Needs:  Biomedical engineer involved in discharge planning:  Yes  MD ELOS: 2-3 weeks Medical Rehab Prognosis:  Excellent Assessment: Pt admitted for CIR therapies. The team will be addressing Lower extremity strength, range of motion, stamina, balance, functional mobility, safety, adaptive techniques and equipment, ADL's, NMR, cognition, language, speech, pain, education. Goals are supervision to mod I. Pt is quite motivated.

## 2012-10-21 NOTE — ED Provider Notes (Signed)
I saw and evaluated the patient, reviewed the resident's note and I agree with the findings and plan.  Please see completed note for this encounter.  Raeford Razor, MD 10/21/12 [phone]

## 2012-10-21 NOTE — Evaluation (Signed)
Physical Therapy Assessment and Plan  Patient Details  Name: John Montgomery MRN: [id number] Date of Birth: [birth year]  PT Diagnosis: Abnormal posture, Abnormality of gait and Hemiparesis dominant Rehab Potential: Good ELOS: 2-3 weeks   Today's Date: 10/21/2012 Time: 1300-1400 Time Calculation (min): 60 min  Problem List:  Patient Active Problem List  Diagnosis  . PAF (paroxysmal atrial fibrillation)  . First degree AV block  . SDH (subdural hematoma)  . HTN (hypertension)  . CAD, RCA PTCA '98, RCA DES '06. Low risk Nuc 2011  . Dyslipidemia  . PVD, moderate RICA disease  . Diastolic dysfunction, grade 1 with an EF of 50-55% 2011    Past Medical History:  Past Medical History  Diagnosis Date  . Hypertension   . Hyperlipidemia   . Myocardial infarction    Past Surgical History:  Past Surgical History  Procedure Date  . Angioplasty     Assessment & Plan Clinical Impression: John Montgomery is a 78 y.o. male who was knocked over by a neighbor's dog on 10/17/12 while out on a walk. Son noticed blood about patient's face and head and was not acting right. His speech started to worsen soon after with inability to speak as well as difficulty using right side. He was admitted for work up and CCT revealed left frontal lobe IPH with edema and mass effect without shift. CT cervical spine with stable spondylosis. Patient was evaluated by Dr. Franky Macho who recommended F/U CCT for monitoring. Therapy evaluations done yesterday and patient noted to have limited by right sided weakness with RLE instability, with difficulty tracking, easy distractibility, and poor awareness of deficits. CIR recommended for progression. F/U CCT with minimal increase in left frontal-parietal hematoma. Patient had an episode of PAF with flutter 12/04 pm and LHC consulted for input. Low dose BB added with monitoring of HR as patient with tendency towards SSS. Recommendations to keep K+ levels closer to 4.0 and  resumption of home BP medications. Therapies initiated and patient working on standing balance at EOB. Patient with heavy lean to the right with tendency to push and poor awareness of balance deficits as well as right LE instability.    Patient transferred to CIR on 10/20/2012 .   Patient currently requires max with mobility secondary to abnormal tone, unbalanced muscle activation, motor apraxia and decreased coordination.  Prior to hospitalization, patient was Independent with mobility and lived with Son in a House home.  Home access is 1 step into home with concrete slope upward toward door along sidewalk.Stairs to enter.  Patient will benefit from skilled PT intervention to maximize safe functional mobility, minimize fall risk and decrease caregiver burden for planned discharge home with 24 hour assist.  Anticipate patient will benefit from follow up [facility] at discharge.  PT - End of Session Endurance Deficit: Yes  PT Evaluation Precautions/Restrictions Precautions Precautions: Fall Precaution Comments: Strong lateral lean to Rt Restrictions Weight Bearing Restrictions: No Pain Pain Assessment Pain Assessment: No/denies pain Pain Score:   3 Pain Type: Acute pain Pain Location: Leg Pain Orientation: Right;Posterior Pain Descriptors: Stabbing;Spasm (very sudden, brief and only 2-3x/day) Pain Intervention(s): Medication (See eMAR);Repositioned Home Living/Prior Functioning Home Living Lives With: Son Available Help at Discharge: Available 24 hours/day Type of Home: House Home Access: Stairs to enter [employer] of Steps: 1 step into home with concrete slope upward toward door along sidewalk. Entrance Stairs-Rails: None Home Layout: One level Bathroom Shower/Tub: Forensic scientist: Standard Bathroom Accessibility: Yes How Accessible: Accessible  via walker Home Adaptive Equipment: None Prior Function Level of Independence: Independent with basic  ADLs;Independent with homemaking with ambulation;Independent with gait Able to Take Stairs?: Yes Driving: No Leisure: Hobbies-no Comments:  (eating, watch tv) Vision/Perception  Vision - History Baseline Vision: Wears glasses only for reading Visual History: Macular degeneration Patient Visual Report: No change from baseline  Cognition Arousal/Alertness: Awake/alert Orientation Level: Oriented X4 Attention: Focused Focused Attention: Appears intact Focused Attention Impairment: Verbal basic;Functional basic Awareness: Impaired Awareness Impairment: Intellectual impairment Problem Solving: Impaired Safety/Judgment: Impaired Sensation Sensation Light Touch: Appears Intact Coordination Gross Motor Movements are Fluid and Coordinated: No Fine Motor Movements are Fluid and Coordinated: No (RUE is brunnstrom stage) Coordination and Movement Description:  (RUE has  gross grasp and release ) Heel Shin Test: impaired w/ R LE (weakness/hemiparesis) Motor  Motor Motor: Hemiplegia;Abnormal postural alignment and control;Motor impersistence Motor - Skilled Clinical Observations:  (RUE gross grasp and release)  Mobility Bed Mobility Bed Mobility: Rolling Right;Sitting - Scoot to Edge of Bed Rolling Right: 2: Max assist Rolling Right Details: Tactile cues for posture;Tactile cues for placement;Tactile cues for weight bearing;Verbal cues for technique;Manual facilitation for placement;Manual facilitation for weight bearing Supine to Sit: 1: +2 Total assist Supine to Sit: Patient Percentage: 50% Transfers Sit to Stand: 1: +2 Total assist Sit to Stand Details: Manual facilitation for weight shifting;Manual facilitation for placement;Manual facilitation for weight bearing Trunk/Postural Assessment  Cervical Assessment Cervical Assessment: Exceptions to [facility] (forward head) Postural Control Postural Control: Deficits on evaluation (leans to right )  Balance Balance Balance Assessed:  Yes Static Sitting Balance Static Sitting - Balance Support: Left upper extremity supported;Feet supported Static Sitting - Level of Assistance: 1: +1 Total assist (unsupported sitting EOB) Static Sitting - Comment/# of Minutes:  (R lateral leaning ) Dynamic Sitting Balance Dynamic Sitting - Balance Support: Left upper extremity supported;Feet supported;During functional activity Dynamic Sitting - Level of Assistance: 1: +1 Total assist Dynamic Sitting Balance - Compensations: Occasional min (A) with reaching to right side to prevent LOB to right.  (max cues to maintain midline EOB) Dynamic Sitting - Balance Activities: Forward lean/weight shifting;Trunk control activities;Reaching for objects Static Standing Balance Static Standing - Balance Support: During functional activity;Left upper extremity supported Static Standing - Level of Assistance: 1: +2 Total assist Extremity Assessment  RUE Assessment RUE Assessment: Exceptions to [facility] RUE Tone RUE Tone Comments:  (decreased muscle tone) LUE Assessment LUE Assessment: Within Functional Limits RLE Decreased tone LLE WFL See FIM for current functional status Refer to Care Plan for Long Term Goals  Recommendations for other services: None  Discharge Criteria: Patient will be discharged from PT if patient refuses treatment 3 consecutive times without medical reason, if treatment goals not met, if there is a change in medical status, if patient makes no progress towards goals or if patient is discharged from hospital.  The above assessment, treatment plan, treatment alternatives and goals were discussed and mutually agreed upon: by patient and by family  Rex Kras 10/21/2012, 6:33 PM

## 2012-10-22 ENCOUNTER — Inpatient Hospital Stay (HOSPITAL_COMMUNITY): Payer: Medicare Other | Admitting: Physical Therapy

## 2012-10-22 LAB — URINALYSIS, ROUTINE W REFLEX MICROSCOPIC
Leukocytes, UA: NEGATIVE
Nitrite: NEGATIVE
Specific Gravity, Urine: 1.014 (ref 1.005–1.030)
pH: 7.5 (ref 5.0–8.0)

## 2012-10-22 NOTE — Progress Notes (Signed)
Physical Therapy Note  Patient Details  Name: John Montgomery MRN: [id number] Date of Birth: Apr 04, 1921 Today's Date: 10/22/2012  Time: 1030-1100 30 minutes  No c/o pain.  Squat pivot transfers with max A.  Pt requires manual facilitation and assist for proper UE/LE placement to decrease pushing.  Pt pushes to weak R side with L LE and UE.  Standing balance with mirror, pt initially with withdrawal reflex R LE, able to place foot on ground with increased time.  Standing with tactile cues for knee and hip extension on R, pt able to perform reaching task with manual facilitation for wt shifts to L, able to bring hips to midline.  Standing gait training with +2 assist, 3 musketeers style with assist for wt shifts and R LE advancement.  Pt able to initiate L wt shift to advance R LE with increased time.  Individual therapy    Emory Leaver 10/22/2012, 10:56 AM

## 2012-10-22 NOTE — Progress Notes (Signed)
Patient ID: John Montgomery, male   DOB: [birth year], 78 y.o.   MRN: [id number] Patient ID: John Montgomery, male   DOB: 05-07-1921, 78 y.o.   MRN: [id number]  12/8.   Admitted to the rehabilitation unit  with history of  left frontoparietal hematoma.  No complaints; alert and appropriate ENT unremarkable. Chest clear to auscultation. Cardiovascular normal heart sounds no murmurs. Abdomen benign no distention or tenderness.  Condom catheter in place Extremities negative; SCDs noted ; neuro right sided weakness.  BP Readings from Last 3 Encounters:  10/22/12 179/69  10/20/12 166/76    H&P signed by Ranelle Oyster, MD at 10/20/12 1545     Author: Ranelle Oyster, MD Service: Physical Medicine and Rehabilitation Author Type: Physician    Filed: 10/20/12 1545 Note Time: 10/20/12 1524           Physical Medicine and Rehabilitation Admission H&P   Chief Complaint    Patient presents with    .   Trauma     :   HPI: John Montgomery is a 78 y.o. male who was knocked over by a neighbor's dog on 10/17/12 while out on a walk. Son noticed blood about patient's face and head and was not acting right. His speech started to worsen soon after with inability to speak as well as difficulty using right side. He was admitted for work up and CCT revealed left frontal lobe IPH with edema and mass effect without shift. CT cervical spine with stable spondylosis. Patient was evaluated by Dr. Franky Macho who recommended F/U CCT for monitoring. Therapy evaluations done yesterday and patient noted to have limited by right sided weakness with RLE instability, with difficulty tracking, easy distractibility, and poor awareness of deficits. CIR recommended for progression. F/U CCT with minimal increase in left frontal-parietal hematoma. Patient had an episode of PAF with flutter 12/04 pm and LHC consulted for input. Low dose BB added with monitoring of HR as patient with tendency towards SSS. Recommendations to keep K+ levels closer to  4.0 and resumption of home BP medications. Therapies initiated and patient working on standing balance at EOB. Patient with heavy lean to the right with tendency to push and poor awareness of balance deficits as well as right LE instability. Therapy team recommeding CIR.   ROS   Past Medical History    Diagnosis   Date    .   Hypertension      .   Hyperlipidemia      .   Myocardial infarction          Past Surgical History    Procedure   Date    .   Angioplasty         History reviewed. No pertinent family history.   Social History: Widowed. Walks about 2 miles daily, does housework and cooking. Stopped driving this summer due to vision problems. His youngest son lives with him. He reports that he has never smoked. He has never used smokeless tobacco. He reports that he does not drink alcohol or use illicit drugs.   Allergies:   Allergies    Allergen   Reactions    .   Penicillins          hives       Scheduled Meds:   .   amLODipine   5 mg   Oral   Daily    .   atorvastatin   20 mg   Oral   q1800    .  metoprolol tartrate   12.5 mg   Oral   BID    .   [COMPLETED] metoprolol tartrate   25 mg   Oral   Once    .   pantoprazole   40 mg   Oral   QHS    .   senna-docusate   1 tablet   Oral   BID    .   [DISCONTINUED] simvastatin   40 mg   Oral   q1800        Medications Prior to Admission    Medication   Sig   Dispense   Refill    .   amLODipine (NORVASC) 5 MG tablet   Take 5 mg by mouth daily.        Marland Kitchen   CALCIUM-VITAMIN D PO   Take 1 tablet by mouth daily.        .   Cholecalciferol (VITAMIN D3) 2000 UNITS TABS   Take 1 tablet by mouth daily.        .   metoprolol tartrate (LOPRESSOR) 25 MG tablet   Take 25 mg by mouth daily.        .   Multiple Vitamins-Minerals (EYE VITAMINS PO)   Take 1 tablet by mouth daily.        Marland Kitchen   omeprazole (PRILOSEC) 20 MG capsule   Take 20 mg by mouth daily.        .   pravastatin (PRAVACHOL) 80 MG tablet   Take 80 mg by mouth at bedtime.        .    ramipril (ALTACE) 10 MG capsule   Take 10 mg by mouth daily.        .   [DISCONTINUED] aspirin 81 MG tablet   Take 81 mg by mouth at bedtime.        .   [DISCONTINUED] clopidogrel (PLAVIX) 75 MG tablet   Take 75 mg by mouth daily.           Home:   Home Living   Lives With: Family (2 sons work during the day; family can arrange 24hr)   Available Help at Discharge: Family   Type of Home: House   Home Access: Ramped entrance (back of the house)   Home Layout: One level   Bathroom Shower/Tub: Arts development officer: Standard   Home Adaptive Equipment: None   Functional History:   Prior Function   Able to Take Stairs?: Yes   Driving: No   Vocation: Other (comment) (("very sharp" per daughter, active, walks daily, social))   Functional Status:   Mobility:   Bed Mobility   Bed Mobility: Not assessed   Supine to Sit: 3: Mod assist;HOB flat;With rails   Sitting - Scoot to Edge of Bed: 2: Max assist   Transfers   Transfers: Sit to Stand;Stand to Sit   Sit to Stand: 1: +2 Total assist;From chair/3-in-1   Sit to Stand: Patient Percentage: 70%   Stand to Sit: 1: +2 Total assist;To chair/3-in-1   Stand to Sit: Patient Percentage: 60%   Stand Pivot Transfers: 1: +2 Total assist   Stand Pivot Transfers: Patient Percentage: 30%   Ambulation/Gait   Ambulation/Gait Assistance: Not tested (comment)   Stairs: No   Wheelchair Mobility   Wheelchair Mobility: No   ADL:   ADL   Grooming: Therapist, nutritional;Set up;Supervision/safety   Where Assessed - Grooming: Supported sitting   Lower Body Bathing: +2 Total assistance  Where Assessed - Lower Body Bathing: Supported sit to stand   Upper Body Dressing: Maximal assistance   Where Assessed - Upper Body Dressing: Supported sitting   Lower Body Dressing: +2 Total assistance   Where Assessed - Lower Body Dressing: Supported sit to Database administrator: +2 Total assistance   Toilet Transfer Method: Sit to Financial controller: (chair with armrests)   Equipment Used: Gait belt   Transfers/Ambulation Related to ADLs: +2total(pt=70%) sit to stand from chair with armrests   ADL Comments: performed dynamic sitting balance with Min A to return to midline and to prevent pt from falling forward; perform weight shifting in standing- pt pushing to the right with left UE and LE's- able to maintain midline for ~5sec before leaning right again. RLE fluctuated between hyper-extending and buckling during weight shifting- blocked knee to allow weight bearing.   Cognition:   Cognition   Overall Cognitive Status: Impaired   Arousal/Alertness: Awake/alert   Orientation Level: Oriented X4   Attention: Focused   Focused Attention: Impaired   Focused Attention Impairment: Verbal basic;Functional basic   Awareness: Impaired   Awareness Impairment: Intellectual impairment   Problem Solving: Impaired   Problem Solving Impairment: Verbal basic;Functional basic   Safety/Judgment: Impaired   Cognition   Overall Cognitive Status: Impaired   Area of Impairment: Attention;Following commands;Awareness of deficits;Problem solving   Arousal/Alertness: Awake/alert   Orientation Level: Appears intact for tasks assessed   Behavior During Session: [facility] for tasks performed   Current Attention Level: Sustained   Following Commands: Follows multi-step commands consistently   Awareness of Deficits: Pt stated "I thought I could walk to that chair. I guess I was wrong." (pt assumed he would be able to ambulate without movement in right LE however able to recgonize after attempting ambulation pt is unable.)   Problem Solving: Slow process at times   Cognition - Other Comments: Pt. continues to need increased time to follow commands and cues for staying focused on task.   Blood pressure 136/49, pulse 85, temperature 98.2 F (36.8 C), temperature source Oral, resp. rate 14, height 5\' 5"  (1.651 m), weight 55.1 kg (121 lb 7.6 oz), SpO2  98.00%.   Physical Exam  Nursing note and vitals reviewed.   Constitutional: He is oriented to person, place, and time.  Frail appearing elderly male.  HENT:   Head: Normocephalic.  Mid occiput area with abrasion and dried blood.   Eyes: Pupils are equal, round, and reactive to light.   Neck: Normal range of motion.   Cardiovascular: Normal rate and regular rhythm.   Pulmonary/Chest: Effort normal and breath sounds normal.   Abdominal: Soft. Bowel sounds are normal.  Musculoskeletal: He exhibits no edema.  Neurological: He is alert and oriented to person, place, and time.  . Pleasant and appropriate. Follows commands without difficulty. Soft speech without dysarthria. Right sided weakness with intermittent flexor spasms of RLE. RUE is 0/5 deltoid, biceps, triceps, grip 4/5. RLE is 1 at RHF, RKE and 0/5 at ankle. Senses pain throughout right side.right central 7 and some tongue deviation, but other CN normal. DTR's are 2+ right elbow, knee. Toes non reactive. Cognitively he displays good insight and awareness. Id's simple objects without issue. Occasionally struggles with longer phrases and sentences. Skin: Skin is warm and dry. A few abrasions on scalp. Psychiatric: He has a normal mood and affect. His speech is normal. Thought content normal. His mood appears not anxious. Cognition and memory are  normal.      Results for orders placed during the hospital encounter.    CALCIUM, IONIZED Status: Normal      Collection Time      10/18/12 9:04 PM    Component   Value   Range   Comment      Calcium, Ion   1.22   1.12 - 1.32 mmol/L      BASIC METABOLIC PANEL Status: Abnormal      Collection Time      10/20/12 5:05 AM    Component   Value   Range   Comment      Sodium   138   135 - 145 mEq/L        Potassium   3.7   3.5 - 5.1 mEq/L        Chloride   105   96 - 112 mEq/L        CO2   23   19 - 32 mEq/L        Glucose, Bld   84   70 - 99 mg/dL        BUN   16   6 - 23 mg/dL        Creatinine,  Ser   0.83   0.50 - 1.35 mg/dL        Calcium   8.8   8.4 - 10.5 mg/dL        GFR calc non Af Amer   76 (*)   >90 mL/min        GFR calc Af Amer   88 (*)   >90 mL/min      TSH Status: Normal      Collection Time      10/20/12 5:05 AM    Component   Value   Range   Comment      TSH   1.813   0.350 - 4.500 uIU/mL      CBC Status: Abnormal      Collection Time      10/20/12 5:05 AM    Component   Value   Range   Comment      WBC   7.6   4.0 - 10.5 K/uL        RBC   3.31 (*)   4.22 - 5.81 MIL/uL        Hemoglobin   10.4 (*)   13.0 - 17.0 g/dL        HCT   16.1 (*)   39.0 - 52.0 %        MCV   89.1   78.0 - 100.0 fL        MCH   31.4   26.0 - 34.0 pg        MCHC   35.3   30.0 - 36.0 g/dL        RDW   09.6   04.5 - 15.5 %        Platelets   183   150 - 400 K/uL       No results found.   Post Admission Physician Evaluation:    Functional deficits secondary to left frontal lobe ICH after fall. Patient is admitted to receive collaborative, interdisciplinary care between the physiatrist, rehab nursing staff, and therapy team. Patient's level of medical complexity and substantial therapy needs in context of that medical necessity cannot be provided at a lesser intensity of care such as a SNF. Patient has experienced substantial functional loss from  his/her baseline which was documented above under the "Functional History" and "Functional Status" headings. Judging by the patient's diagnosis, physical exam, and functional history, the patient has potential for functional progress which will result in measurable gains while on inpatient rehab. These gains will be of substantial and practical use upon discharge in facilitating mobility and self-care at the household level. Physiatrist will provide 24 hour management of medical needs as well as oversight of the therapy plan/treatment and provide guidance as appropriate regarding the interaction of the two. 24 hour rehab nursing will assist with bladder  management, bowel management, safety, skin/wound care, disease management, medication administration, pain management and patient education and help integrate therapy concepts, techniques,education, etc. PT will assess and treat for: Lower extremity strength, range of motion, stamina, balance, functional mobility, safety, adaptive techniques and equipment, NMR, spasticity, ROM. Goals are: min assist. OT will assess and treat for: ADL's, functional mobility, safety, upper extremity strength, adaptive techniques and equipment, NMR, spasticity, education. Goals are: min to mod assist. SLP will assess and treat for: speech, language, swallowing. Goals are:  Mod I. Case Management and Social Worker will assess and treat for psychological issues and discharge planning. Team conference will be held weekly to assess progress toward goals and to determine barriers to discharge. Patient will receive at least 3 hours of therapy per day at least 5 days per week. ELOS: 3 weeks Prognosis: excellent Medical Problem List and Plan:   1. DVT Prophylaxis/Anticoagulation: Mechanical: Sequential compression devices, below knee Bilateral lower extremities   2. Pain Management: N/A   3. Mood: appropriate. Will monitor for now.   4. Neuropsych: This patient is capable of making decisions on his/her own behalf.   5. PAF: resolved. Continue low dose BB with close monitoring of HR for bradycardia as with tendency towards SSS. Keep K+ levels around 4.0.   6. HTN: Monitor with bid checks. Norvasc resumed today.   7. ABLA: Will check follow up labs on Monday.   8. RLE spasm: ?low dose meds on a prn basis only for now   Ivory Broad, MD 10/20/12

## 2012-10-22 NOTE — Plan of Care (Signed)
Problem: RH BLADDER ELIMINATION Goal: RH STG MANAGE BLADDER WITH ASSISTANCE STG Manage Bladder With min Assistance  Outcome: Progressing Timed toileting during the day, but patient uses condom cath @ hs

## 2012-10-23 ENCOUNTER — Inpatient Hospital Stay (HOSPITAL_COMMUNITY): Payer: Medicare Other | Admitting: Physical Therapy

## 2012-10-23 ENCOUNTER — Inpatient Hospital Stay (HOSPITAL_COMMUNITY): Payer: Medicare Other

## 2012-10-23 DIAGNOSIS — S069X9A Unspecified intracranial injury with loss of consciousness of unspecified duration, initial encounter: Secondary | ICD-10-CM

## 2012-10-23 DIAGNOSIS — W19XXXA Unspecified fall, initial encounter: Secondary | ICD-10-CM

## 2012-10-23 LAB — CBC WITH DIFFERENTIAL/PLATELET
Basophils Relative: 0 % (ref 0–1)
Eosinophils Absolute: 0.2 10*3/uL (ref 0.0–0.7)
HCT: 34.4 % — ABNORMAL LOW (ref 39.0–52.0)
Hemoglobin: 11.7 g/dL — ABNORMAL LOW (ref 13.0–17.0)
MCH: 30.5 pg (ref 26.0–34.0)
MCHC: 34 g/dL (ref 30.0–36.0)
MCV: 89.8 fL (ref 78.0–100.0)
Monocytes Absolute: 0.5 10*3/uL (ref 0.1–1.0)
Monocytes Relative: 7 % (ref 3–12)
Neutro Abs: 5.2 10*3/uL (ref 1.7–7.7)

## 2012-10-23 LAB — COMPREHENSIVE METABOLIC PANEL
Albumin: 3.6 g/dL (ref 3.5–5.2)
BUN: 20 mg/dL (ref 6–23)
Chloride: 102 mEq/L (ref 96–112)
Creatinine, Ser: 0.91 mg/dL (ref 0.50–1.35)
Total Bilirubin: 0.4 mg/dL (ref 0.3–1.2)

## 2012-10-23 NOTE — Progress Notes (Signed)
Physical Therapy Session Note  Patient Details  Name: John Montgomery MRN: [id number] Date of Birth: [birth year]  Today's Date: 10/23/2012 Time: 1100-1206 Time Calculation (min): 66 min  Short Term Goals: Week 1:  PT Short Term Goal 1 (Week 1): bed mobility with mod assist PT Short Term Goal 2 (Week 1): transfers bed to [facility] with mod assist to the left PT Short Term Goal 3 (Week 1): WC mobility mod assist to help steer PT Short Term Goal 4 (Week 1): gait 2 person assist >25' with LRAD PT Short Term Goal 5 (Week 1): Dynamic sitting balance min assist.    Skilled Therapeutic Interventions/Progress Updates:    This session focused on transfers into and out of the [facility] to a level mat table to both the right and the left with max assist.  Pt with strong push to the right during transitional movement.  Neuro re ed using mirror for visual feedback to find midline in both sitting and standing.  Started standing with PT, then standing with hemi walker, then standing in hallway with hallway rail max assist with manual assist for hip ext, trunk ext, blocked right knee and manual rotation to the left.  Pre-gait activities in hallway with rail stepping forward and back with max assist right knee blocked.  Tried to see if pt could initiate stepping on his right side, but was unsuccessful (total assist to progress and regress right leg).    Therapy Documentation Precautions:  Precautions Precautions: Fall Precaution Comments: Strong lateral lean to Rt Restrictions Weight Bearing Restrictions: No    Locomotion : Wheelchair Mobility Distance: 100   See FIM for current functional status  Therapy/Group: Individual Therapy  Lurena Joiner B. Dayla Gasca, PT, DPT [phone]   10/23/2012, 5:01 PM

## 2012-10-23 NOTE — Progress Notes (Signed)
Subjective/Complaints: Frustrated that arm strength isn't returning A 12 point review of systems has been performed and if not noted above is otherwise negative.  Objective: Vital Signs: Blood pressure 175/67, pulse 74, temperature 97.8 F (36.6 C), temperature source Oral, resp. rate 18, height 5\' 7"  (1.702 m), weight 61.145 kg (134 lb 12.8 oz), SpO2 98.00%. No results found. No results found for this basename: WBC:2,HGB:2,HCT:2,PLT:2 in the last 72 hours No results found for this basename: NA:2,K:2,CL:2,CO:2,GLUCOSE:2,BUN:2,CREATININE:2,CALCIUM:2 in the last 72 hours CBG (last 3)  No results found for this basename: GLUCAP:3 in the last 72 hours  Wt Readings from Last 3 Encounters:  10/20/12 61.145 kg (134 lb 12.8 oz)  10/18/12 55.1 kg (121 lb 7.6 oz)    Physical Exam:  Mid occiput area with abrasion and dried blood without change Eyes: Pupils are equal, round, and reactive to light.  Neck: Normal range of motion.  Cardiovascular: Normal rate and regular rhythm.  Pulmonary/Chest: Effort normal and breath sounds normal.  Abdominal: Soft. Bowel sounds are normal.  Musculoskeletal: He exhibits no edema.  Neurological: He is alert and oriented to person, place, and time.  . Pleasant and appropriate. Follows commands without difficulty. Soft speech without dysarthria. Right sided weakness with intermittent flexor spasms of RLE. RUE is 0/5 deltoid, biceps, triceps, grip 3-4/5. RLE is 1 at RHF, RKE and 0/5 at ankle. Senses pain throughout right side.right central 7 and some tongue deviation, but other CN normal. DTR's are 2+ right elbow, knee. Toes non reactive. Cognitively he displays good insight and awareness. Id's simple objects without issue. Occasionally struggles with longer phrases and sentences.  Skin: Skin is warm and dry. A few abrasions on scalp. Psychiatric: He has a normal mood and affect. His speech is normal. Thought content normal. His mood appears not anxious. Cognition and  memory are normal.    Assessment/Plan: 1. 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 Physiatrist is providing close team supervision and 24 hour management of active medical problems listed below. Physiatrist and rehab team continue to assess barriers to discharge/monitor patient progress toward functional and medical goals.  Reassured pt that it will take some time to regain strength in his arm/leg.  FIM: FIM - Bathing Bathing Steps Patient Completed: Right Arm;Chest Bathing: 1: Total-Patient completes 0-2 of 10 parts or less than 25%  FIM - Upper Body Dressing/Undressing Upper body dressing/undressing steps patient completed: Thread/unthread right sleeve of pullover shirt/dresss;Put head through opening of pull over shirt/dress Upper body dressing/undressing: 1: Total-Patient completed less than 25% of tasks FIM - Lower Body Dressing/Undressing Lower body dressing/undressing: 1: Total-Patient completed less than 25% of tasks  FIM - Hotel manager Devices: Grab bar or rail for support Toileting: 1: Total-Patient completed zero steps, helper did all 3  FIM - Diplomatic Services operational officer Devices: Psychiatrist Transfers: 1-Two helpers  FIM - Games developer Transfer: 1: Chair or W/C > Bed: Total A (helper does all/Pt. < 25%);1: Bed > Chair or W/C: Total A (helper does all/Pt. < 25%);1: Two helpers  FIM - Locomotion: Ambulation Locomotion: Ambulation: 1: Two helpers  Comprehension Comprehension Mode: Auditory Comprehension: 6-Follows complex conversation/direction: With extra time/assistive device  Expression Expression Mode: Verbal Expression: 6-Expresses complex ideas: With extra time/assistive device  Social Interaction Social Interaction: 6-Interacts appropriately with others with medication or  extra time (anti-anxiety, antidepressant).  Problem Solving Problem Solving: 6-Solves  complex problems: With extra time  Memory Memory: 6-More than reasonable amt of time  Medical Problem List and Plan:  1. DVT Prophylaxis/Anticoagulation: Mechanical: Sequential compression devices, below knee Bilateral lower extremities  2. Pain Management: N/A  3. Mood: appropriate. Will monitor for now.  4. Neuropsych: This patient is capable of making decisions on his/her own behalf.  5. PAF: resolved. Continue low dose BB with close monitoring of HR for bradycardia as with tendency towards SSS. Keep K+ levels around 4.0.  6. HTN: Monitor with bid checks. Norvasc resumed today.  7. ABLA: Will check follow up labs on Monday.  8. RLE spasm: low dose baclofen prn  LOS (Days) 3 A FACE TO FACE EVALUATION WAS PERFORMED  SWARTZ,ZACHARY T 10/23/2012 7:26 AM

## 2012-10-23 NOTE — Progress Notes (Signed)
Physical Therapy Note  Patient Details  Name: AKAASH VANDEWATER MRN: [id number] Date of Birth: 1921/07/07 Today's Date: 10/23/2012  1345-1440 (55 minutes) individual Pain : no reported pain Focus of treatment: Neuro re-ed RT LE Treatment: transfers scoot to squat/pivot mod assist (blocking RT LE secondary to LE extensor tone); Neuro re-ed RT LE- sidelying hip flexion , supine bilateral hip flexion/extension using therapy ball, AA hip abduction, hooklying AA hip ER/IR; supine to side to sit mod assist with max tactile cues for technique; sitting pt able to correct right lean with vcs to min assist; standing to back of chair mod assist blocking rt knee with assist for midline sit to stand.; wc mobility mod assist  . Pt  Has difficulty coordinating uninvolved left extremities for propulsion and steering.   Evon Lopezperez,[name] 10/23/2012, 7:23 AM

## 2012-10-23 NOTE — Progress Notes (Signed)
Speech Language Pathology Daily Session Note  Patient Details  Name: THORVALD ORSINO MRN: [id number] Date of Birth: 1921/04/25  Today's Date: 10/23/2012 Time: 1345-1440 Time Calculation (min): 55 min  Short Term Goals: Week 1: SLP Short Term Goal 1 (Week 1): Pt will complete complex problem solving tasks with Mod I.  SLP Short Term Goal 1 - Progress (Week 1): Progressing toward goal SLP Short Term Goal 2 (Week 1): Pt will demonstrate recall of newly learned information with utilization of compensatory strategies with Mod I.  SLP Short Term Goal 2 - Progress (Week 1): Progressing toward goal SLP Short Term Goal 3 (Week 1): Pt will verbalize abstract thoughts with Mod I for word-finding and utilization of word-finding strategies SLP Short Term Goal 3 - Progress (Week 1): Progressing toward goal  Skilled Therapeutic Interventions: Pt. sitting in wheelchair with son present upon SLP arrival.  Treatment focused on faciliation of complex problem solving, recall of recently learned information.  Pt. performing at a higher level than this SLP expected (per previous notes) and appears to be making significant progress.  Throughout problem solving activity (pill box management), pt. required minimal to supervision cues and exhibited emergent awareness by identifying and remediating error x 1 without cues.  No dysnomia was present during session although intelligibilty mild-moderately decreased intermittently due to pt.'s increased rate of speech in addition to decreased vocal intensity at the end of sentences and several hesitancies (son states speech is not far from baseline).  Pragmatics minimally decreased as pt. intermittently initiated speech before communication partner had finished verbalizing.      FIM:  Comprehension Comprehension: 6-Follows complex conversation/direction: With extra time/assistive device Expression Expression: 4-Expresses basic 75 - 89% of the time/requires cueing 10 - 24% of  the time. Needs helper to occlude trach/needs to repeat words. Social Interaction Social Interaction: 6-Interacts appropriately with others with medication or extra time (anti-anxiety, antidepressant). Problem Solving Problem Solving: 5-Solves complex 90% of the time/cues < 10% of the time Memory Memory: 5-Requires cues to use assistive device  Pain Pain Assessment Pain Assessment: No/denies pain  Therapy/Group: Individual Therapy  Royce Macadamia 10/23/2012, 2:16 PM

## 2012-10-23 NOTE — Progress Notes (Signed)
Inpatient Rehabilitation Center Individual Statement of Services  Patient Name:  John Montgomery  Date:  10/23/2012  Welcome to the Inpatient Rehabilitation Center.  Our goal is to provide you with an individualized program based on your diagnosis and situation, designed to meet your specific needs.  With this comprehensive rehabilitation program, you will be expected to participate in at least 3 hours of rehabilitation therapies Monday-Friday, with modified therapy programming on the weekends.  Your rehabilitation program will include the following services:  Physical Therapy (PT), Occupational Therapy (OT), Speech Therapy (ST), 24 hour per day rehabilitation nursing, Therapeutic Recreaction (TR), Neuropsychology, Case Management (Social Worker), Rehabilitation Medicine, Nutrition Services and Pharmacy Services  Weekly team conferences will be held on Tuesdays to discuss your progress.  Your  Social Worker will talk with you frequently to get your input and to update you on team discussions.  Team conferences with you and your family in attendance may also be held.  Expected length of stay: 2-3 weeks  Overall anticipated outcome: minimal assistance  Depending on your progress and recovery, your program may change.  Your  Social Worker will coordinate services and will keep you informed of any changes.  Your  Social Worker's name and contact numbers are listed  below.  The following services may also be recommended but are not provided by the Inpatient Rehabilitation Center:   Driving Evaluations  Home Health Rehabiltiation Services  Outpatient Rehabilitatation [facility]  Vocational Rehabilitation   Arrangements will be made to provide these services after discharge if needed.  Arrangements include referral to agencies that provide these services.  Your insurance has been verified to be:  Medicare and [employer] primary doctor is:  Dr. Leslie Dales  Pertinent information will be  shared with your doctor and your insurance company.  Social Worker:  [place], [place] [phone] or [phone]  Information discussed with and copy given to patient by: Amada Jupiter, 10/23/2012, 4:15 PM

## 2012-10-23 NOTE — Progress Notes (Signed)
Occupational Therapy Session Note  Patient Details  Name: John Montgomery MRN: [id number] Date of Birth: 1921-08-21  Today's Date: 10/23/2012  Session 1 Time: 0930-1030 Time Calculation (min): 60 min  Short Term Goals: Week 1:  OT Short Term Goal 1 (Week 1): Pt. will maintain static sitting unsupported for 15 minutes with moderate cues during ADL OT Short Term Goal 2 (Week 1): Pt. will bathe UB with miminimal assist OT Short Term Goal 3 (Week 1): Pt. will dress UB with min assist OT Short Term Goal 4 (Week 1): Pt. will stand to donn pants with max assist  Skilled Therapeutic Interventions/Progress Updates:   Pt seated in w/c leaning to right upon arrival.  Pt agreeable to participating in bathing and dressing tasks with sit<>stand at sink.  Pt stated that he wasn't sure what he could do because his right arm was useless.  Discussed with patient purpose of therapy and occupational therapy.  Pt required max verbal cues throughout session to correct sitting balance and lean to left.  Pt exhibits extensor tone in right trunk and LE when initiating sit>stand during bathing and dressing tasks.  Pt exhibits good movement distally in RUE but only trace movement at shoulder.  Focus on activity tolerance, static and dynamic sitting balance, standing balance, compensatory techniques, and safety awareness.  Therapy Documentation Precautions:  Precautions Precautions: Fall Precaution Comments: Strong lateral lean to Rt Restrictions Weight Bearing Restrictions: No   Pain: Pain Assessment Pain Assessment: No/denies pain  See FIM for current functional status  Therapy/Group: Individual Therapy  Session 2 Time: 1300-1345 Pt denies pain Individual Therapy Pt engaged in dynamic sitting on EOM with weight bearing through RUE and facilitated movement.  Practiced sit<>stand from EOM with emphasis on controlled movements with decreased extensor tone on right side.   Pt practiced leaning forward to  touch feet and returning without using LUE to assist, relying on core muscles..  Trace shoulder movement detected.    [name] [facility] 10/23/2012, 10:33 AM

## 2012-10-23 NOTE — Progress Notes (Signed)
Patient information reviewed and entered into eRehab system by Tora Duck, RN, CRRN, PPS Coordinator.  Information including medical coding and functional independence measure will be reviewed and updated through discharge.     Per nursing, [name], RN patient was given "Data Collection Information Summary for Patients in Inpatient Rehabilitation Facilities with attached "Privacy Act Statement-Health Care Records" upon admission.

## 2012-10-24 ENCOUNTER — Inpatient Hospital Stay (HOSPITAL_COMMUNITY): Payer: Medicare Other | Admitting: Physical Therapy

## 2012-10-24 ENCOUNTER — Inpatient Hospital Stay (HOSPITAL_COMMUNITY): Payer: Medicare Other | Admitting: Speech Pathology

## 2012-10-24 ENCOUNTER — Inpatient Hospital Stay (HOSPITAL_COMMUNITY): Payer: Medicare Other

## 2012-10-24 LAB — URINE CULTURE

## 2012-10-24 MED ORDER — SULFAMETHOXAZOLE-TMP DS 800-160 MG PO TABS
1.0000 | ORAL_TABLET | Freq: Two times a day (BID) | ORAL | Status: AC
  Administered 2012-10-24 – 2012-10-31 (×14): 1 via ORAL
  Filled 2012-10-24 (×16): qty 1

## 2012-10-24 NOTE — Progress Notes (Signed)
Orthopedic Tech Progress Note Patient Details:  John Montgomery Nov 15, 1921 [id number]  Ortho Devices Type of Ortho Device: Other (comment) (prafo boot) Ortho Device/Splint Location: (R) LE Ortho Device/Splint Interventions: Application   Jennye Moccasin 10/24/2012, 3:44 PM

## 2012-10-24 NOTE — Progress Notes (Signed)
Social Work  Social Work Assessment and Plan  Patient Details  Name: John Montgomery MRN: [id number] Date of Birth: February 09, 1921  Today's Date: 10/24/2012  Problem List:  Patient Active Problem List  Diagnosis  . PAF (paroxysmal atrial fibrillation)  . First degree AV block  . SDH (subdural hematoma)  . HTN (hypertension)  . CAD, RCA PTCA '98, RCA DES '06. Low risk Nuc 2011  . Dyslipidemia  . PVD, moderate RICA disease  . Diastolic dysfunction, grade 1 with an EF of 50-55% 2011   Past Medical History:  Past Medical History  Diagnosis Date  . Hypertension   . Hyperlipidemia   . Myocardial infarction    Past Surgical History:  Past Surgical History  Procedure Date  . Angioplasty    Social History:  reports that he has never smoked. He has never used smokeless tobacco. He reports that he does not drink alcohol or use illicit drugs.  Family / Support Systems Marital Status: Widow/Widower How Long?: 10 years Patient Roles: Parent Children: daughter (and [place]) Viann Fish @ [phone] Other Supports: has two sons as well, Bruce and Robyne Peers.  Bruce lives with pt and Greggory Stallion alternates between his own house and pt's home (usually there every night) Anticipated Caregiver: Sons and Dtr (2 sons and 1 Dtr are local.) Ability/Limitations of Caregiver: Youngest son lives with patient and not working.  Oldest son stays at night and is retired. Caregiver Availability: 24/7 Family Dynamics: Pt describes all children as very supportive and able to assist  Social History Preferred language: English Religion: Non-Denominational Cultural Background: NA Education: HS Read: Yes Write: Yes Employment Status: Retired Date Retired/Disabled/Unemployed: 24 years Fish farm manager Issues: none Guardian/Conservator: noen   Abuse/Neglect Physical Abuse: Denies Verbal Abuse: Denies Sexual Abuse: Denies Exploitation of patient/patient's resources: Denies Self-Neglect:  Denies  Emotional Status Pt's affect, behavior adn adjustment status: Pt very pleasant, talkative and expresses motivation for therapies.  Admits he is frustrated with his limitations, however, denies any s/s of depression or anxiety - will monitor. Recent Psychosocial Issues: None Pyschiatric History: None Substance Abuse History: None  Patient / Family Perceptions, Expectations & Goals Pt/Family understanding of illness & functional limitations: Pt and daughter have basic understanding of his ICH and current functional limitations.  Daughter very pleased with progress he has made over the past week. Premorbid pt/family roles/activities: Pt was completely independent PTA and walking several miles qd.  Notes daughter was handling his financial matters as his vision was impaired. Anticipated changes in roles/activities/participation: Will likely require some physical assistance from family and 24/7 care - all are "prepared" to share in providing this to patient Pt/family expectations/goals: "I want to be able to do more for myself"  [employer]: None Premorbid Home Care/DME Agencies: None Transportation available at discharge: yes Resource referrals recommended: Neuropsychology;Support group (specify)  Discharge Planning Living Arrangements: Children Support Systems: Children Type of Residence: Private residence Insurance Resources: [employer] Financial Resources: Social Security Financial Screen Referred: No Living Expenses: Own Money Management: Family Do you have any problems obtaining your medications?: No Home Management: pt and sons Patient/Family Preliminary Plans: Pt plans to return to his own home with family to provide 24/7 assistance Social Work Anticipated Follow Up Needs: HH/OP;Support Group Expected length of stay: 2-3 weeks  Clinical Impression Very pleasant, oriented and motivated gentleman here after a fall with ICH.  Making excellent  progress and has very supportive family who are prepared to provide 24/7 assist.  Denies any significant  emotional distress, however, will monitor throughout stay.  Rhianon Zabawa 10/24/2012, 5:52 PM

## 2012-10-24 NOTE — Progress Notes (Signed)
Physical Therapy Session Note  Patient Details  Name: John Montgomery MRN: [id number] Date of Birth: 29-Sep-1921  Today's Date: 10/24/2012 Time: 7829-5621 Time Calculation (min): 66 min  Short Term Goals: Week 1:  PT Short Term Goal 1 (Week 1): bed mobility with mod assist PT Short Term Goal 2 (Week 1): transfers bed to [name] with mod assist to the left PT Short Term Goal 3 (Week 1): WC mobility mod assist to help steer PT Short Term Goal 4 (Week 1): gait 2 person assist >25' with LRAD PT Short Term Goal 5 (Week 1): Dynamic sitting balance min assist.    Skilled Therapeutic Interventions/Progress Updates:    This session focused on WC mobility from room to gym mod assist to steer.  Pt better able to coordinate upper and lower extremity to help wheel down the hall, but still tends to list right and needs assist to keep straight and navigate obstacles.  Gait in parallel bars sit to stand max assist with verbal cues for hand placement on WC railing.  Max assist gait to support trunk, block right leg and knee, and progress right leg and knee forward with max verbal cues for sequencing, posture, and safety.  PT pulling WC behind for safety.  Did length of parallel bars x 2.  Transfers to and from [name] to level mat table to both right and left max assist with manual and verbal cues for hand placement, trunk lean, slow down movements and blocking of right leg.  Pt continues to push to the right during transitional movements, less in static sitting (once positioned well).  Seated and standing work on finding midline min assist in sitting max assist in standing using mirror for visual input.  Pt focusing on negatives today.  I tried to reinforce what progress he has made even in a day's time.  Son, Smitty Cords was present observing treatment session.  Discussed with Bruce potential outcomes, answered questions about LOS and progression.     Therapy Documentation Precautions:  Precautions Precautions:  Fall Precaution Comments: Strong lateral lean to Rt Restrictions Weight Bearing Restrictions: Yes   Pain: Pain Assessment Pain Assessment: No/denies pain Pain Score: 0-No pain (did report his right hip hurt this morning, but not now.  ) Mobility:   Locomotion : Ambulation Ambulation/Gait Assistance: 2: Max Lawyer Distance: 150   See FIM for current functional status  Therapy/Group: Individual Therapy  Lurena Joiner B. Mervin Ramires, PT, DPT [phone]   10/24/2012, 2:46 PM

## 2012-10-24 NOTE — Patient Care Conference (Signed)
Inpatient RehabilitationTeam Conference Note Date: 10/24/2012   Time: 2:50 PM    Patient Name: John Montgomery      Medical Record Number: [id number]  Date of Birth: 01-23-1921 Sex: Male         Room/Bed: 4009/4009-01 Payor Info: Payor: MEDICARE  Plan: MEDICARE PART A AND B  Product Type: *No Product type*     Admitting Diagnosis: LT ICH   Admit Date/Time:  10/20/2012  2:14 PM Admission Comments: No comment available   Primary Diagnosis:  SDH (subdural hematoma) Principal Problem: SDH (subdural hematoma)  Patient Active Problem List   Diagnosis Date Noted  . PAF (paroxysmal atrial fibrillation) 10/19/2012  . First degree AV block 10/19/2012  . SDH (subdural hematoma) 10/19/2012  . HTN (hypertension) 10/19/2012  . CAD, RCA PTCA '98, RCA DES '06. Low risk Nuc 2011 10/19/2012  . Dyslipidemia 10/19/2012  . PVD, moderate RICA disease 10/19/2012  . Diastolic dysfunction, grade 1 with an EF of 50-55% 2011 10/19/2012    Expected Discharge Date: Expected Discharge Date: 11/10/12  Team Members Present: Physician: Dr. Faith Rogue Social Worker Present: Amada Jupiter, LCSW Nurse Present: Other (comment) Milly Jakob Rcom, RN) PT Present: Other (comment) Oretha Milch, PT) OT Present: Roney Mans, OT;Ardis Rowan, Corky Crafts, OT SLP Present: Feliberto Gottron, SLP Other (Discipline and Name): Tora Duck, PPS Coordinator     Current Status/Progress Goal Weekly Team Focus  Medical   left SDH, right sided weakness  increase functional use of right upper and lower extremity  pain mgt, wound care, fluid and electrolyte balance   Bowel/Bladder   Patient is continent of bowel/bladder, LBM 10/23/12; Patient prefer to wear condom catheter at HS  remain continent of bowel/bladder  monitor   Swallow/Nutrition/ Hydration             ADL's   max A overall; decreased static/dynamic sitting balance; max A sit<>stand and transfers  min A overall  RUE function; sitting balance; standing balance;  transfers   Mobility   max assist transfers into and out of WC, max assist to stand and max assist for pre gait.  He was able to walk this week, but with 2 person assist ("3 muskateers style" ) and he is unable to progress right leg forward at all at this time.    supervision transfer goal, min assist car transfer, mod I wc mobility, min assist stair goal, supervision gait goal.  (these may be a little ambitious, but we will adjust as needed)  transfers, finding midline in sitting and standing, pre-gait activities, gait, balance in both sitting and standing.     Communication             Safety/Cognition/ Behavioral Observations  Supervision  Mod I  problem solving, working memory    Pain   No c/o pain  3 or less on scale 0-10  Monitor   Skin   Pink scab to posterior scalp, clean, dry, and intact OTA  No new skin breakdown while in rehab.  monitor    Rehab Goals Patient on target to meet rehab goals: Yes *See Interdisciplinary Assessment and Plan and progress notes for long and short-term goals  Barriers to Discharge: none identified yet    Possible Resolutions to Barriers:  n/a    Discharge Planning/Teaching Needs:  home with family who can provide 24/7 assistance      Team Discussion:  Doing very well cognitively.  Hope PRAFO with help to decrease spasms.  Min assist goals overall.  Revisions to Treatment Plan:  None   Continued Need for Acute Rehabilitation Level of Care: The patient requires daily medical management by a physician with specialized training in physical medicine and rehabilitation for the following conditions: Daily direction of a multidisciplinary physical rehabilitation program to ensure safe treatment while eliciting the highest outcome that is of practical value to the patient.: Yes Daily medical management of patient stability for increased activity during participation in an intensive rehabilitation regime.: Yes Daily analysis of laboratory values and/or  radiology reports with any subsequent need for medication adjustment of medical intervention for : Neurological problems;Other  John Montgomery 10/24/2012, 3:48 PM

## 2012-10-24 NOTE — Progress Notes (Signed)
Social Work Patient ID: John Montgomery, male   DOB: December 30, 1920, 78 y.o.   MRN: [id number]  Met with patient and daughter this afternoon to review team conference.  Both aware and agreeable with targeted d/c 12/27 at minimal assistance goals overall.  Pt admits to feeling a little discouraged today by his limitations and wonders if he is more fatigued.  "I am going to try and be more positive tomorrow.".   Provided encouragement to pt and explained that we have also planned for him to have a Neuropsych consult this week as well.  Daughter very supportive and pleased with progress to date.  Will continue to follow.  Denise Washburn, LCSW

## 2012-10-24 NOTE — Progress Notes (Signed)
Subjective/Complaints: Frustrated that arm strength isn't returning A 12 point review of systems has been performed and if not noted above is otherwise negative.  Objective: Vital Signs: Blood pressure 150/69, pulse 72, temperature 97.7 F (36.5 C), temperature source Oral, resp. rate 18, height 5\' 7"  (1.702 m), weight 61.145 kg (134 lb 12.8 oz), SpO2 97.00%. No results found.  Basename 10/23/12 0802  WBC 7.3  HGB 11.7*  HCT 34.4*  PLT 222    Basename 10/23/12 0802  NA 136  K 4.5  CL 102  GLUCOSE 93  BUN 20  CREATININE 0.91  CALCIUM 9.5   CBG (last 3)  No results found for this basename: GLUCAP:3 in the last 72 hours  Wt Readings from Last 3 Encounters:  10/20/12 61.145 kg (134 lb 12.8 oz)  10/18/12 55.1 kg (121 lb 7.6 oz)    Physical Exam:  Mid occiput area with abrasion and dried blood without change Eyes: Pupils are equal, round, and reactive to light.  Neck: Normal range of motion.  Cardiovascular: Normal rate and regular rhythm.  Pulmonary/Chest: Effort normal and breath sounds normal.  Abdominal: Soft. Bowel sounds are normal.  Musculoskeletal: He exhibits no edema.  Neurological: He is alert and oriented to person, place, and time.  . Pleasant and appropriate. Follows commands without difficulty. Soft speech without dysarthria. Right sided weakness with intermittent flexor spasms of RLE. RUE is 0/5 deltoid, biceps, triceps, grip 3-4/5. RLE is 1 at RHF, RKE and 0/5 at ankle. Senses pain throughout right side.right central 7 and some tongue deviation, but other CN normal. DTR's are 2+ right elbow, knee. Toes non reactive. Cognitively he displays good insight and awareness. Id's simple objects without issue. Occasionally struggles with longer phrases and sentences.  Skin: Skin is warm and dry. A few abrasions on scalp. Psychiatric: He has a normal mood and affect. His speech is normal. Thought content normal. His mood appears not anxious. Cognition and memory are  normal.    Assessment/Plan: 1. 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 Physiatrist is providing close team supervision and 24 hour management of active medical problems listed below. Physiatrist and rehab team continue to assess barriers to discharge/monitor patient progress toward functional and medical goals.  Reassured pt that it will take some time to regain strength in his arm/leg.  FIM: FIM - Bathing Bathing Steps Patient Completed: Chest;Right Arm;Abdomen Bathing: 2: Max-Patient completes 3-4 54f 10 parts or 25-49%  FIM - Upper Body Dressing/Undressing Upper body dressing/undressing steps patient completed: Thread/unthread left sleeve of pullover shirt/dress;Put head through opening of pull over shirt/dress Upper body dressing/undressing: 3: Mod-Patient completed 50-74% of tasks FIM - Lower Body Dressing/Undressing Lower body dressing/undressing: 1: Total-Patient completed less than 25% of tasks  FIM - Hotel manager Devices: Grab bar or rail for support Toileting: 1: Total-Patient completed zero steps, helper did all 3  FIM - Diplomatic Services operational officer Devices: Psychiatrist Transfers: 1-Two helpers  FIM - Banker Devices: Arm rests Bed/Chair Transfer: 2: Bed > Chair or W/C: Max A (lift and lower assist);2: Chair or W/C > Bed: Max A (lift and lower assist)  FIM - Locomotion: Wheelchair Distance: 100 Locomotion: Wheelchair: 2: Travels 50 - 149 ft with maximal assistance (Pt: 25 - 49%) FIM - Locomotion: Ambulation Locomotion: Ambulation: 0: Activity did not occur  Comprehension Comprehension Mode: Auditory Comprehension: 6-Follows  complex conversation/direction: With extra time/assistive device  Expression Expression Mode: Verbal Expression: 4-Expresses basic  75 - 89% of the time/requires cueing 10 - 24% of the time. Needs helper to occlude trach/needs to repeat words.  Social Interaction Social Interaction: 6-Interacts appropriately with others with medication or extra time (anti-anxiety, antidepressant).  Problem Solving Problem Solving: 5-Solves complex 90% of the time/cues < 10% of the time  Memory Memory: 5-Requires cues to use assistive device  Medical Problem List and Plan:  1. DVT Prophylaxis/Anticoagulation: Mechanical: Sequential compression devices, below knee Bilateral lower extremities  2. Pain Management: N/A  3. Mood: appropriate. Will monitor for now.  4. Neuropsych: This patient is capable of making decisions on his/her own behalf.  5. PAF: resolved. Continue low dose BB with close monitoring of HR for bradycardia as with tendency towards SSS. Keep K+ levels around 4.0.  6. HTN: Monitor with bid checks. Norvasc resumed today.  7. ABLA: hgb climbing  8. RLE spasm: low dose baclofen prn has been helpful  -add prafo to control ankle at night  LOS (Days) 4 A FACE TO FACE EVALUATION WAS PERFORMED  John Montgomery T 10/24/2012 7:41 AM

## 2012-10-24 NOTE — Progress Notes (Signed)
Speech Language Pathology Daily Session Note  Patient Details  Name: John Montgomery MRN: [id number] Date of Birth: Dec 19, 1920  Today's Date: 10/24/2012 Time: 0800-0845 Time Calculation (min): 45 min  Short Term Goals: Week 1: SLP Short Term Goal 1 (Week 1): Pt will complete complex problem solving tasks with Mod I.  SLP Short Term Goal 1 - Progress (Week 1): Progressing toward goal SLP Short Term Goal 2 (Week 1): Pt will demonstrate recall of newly learned information with utilization of compensatory strategies with Mod I.  SLP Short Term Goal 2 - Progress (Week 1): Progressing toward goal SLP Short Term Goal 3 (Week 1): Pt will verbalize abstract thoughts with Mod I for word-finding and utilization of word-finding strategies SLP Short Term Goal 3 - Progress (Week 1): Progressing toward goal  Skilled Therapeutic Interventions: Treatment focus on cognitive goals. Pt awake and alert upon entering room and requesting to get up in his wheelchair. Pt required supervision question cues for functional problem solving with self-care tasks and required supervision question cues for reading comprehension at the paragraph level with use of his glasses.    FIM:  Comprehension Comprehension Mode: Auditory Comprehension: 6-Follows complex conversation/direction: With extra time/assistive device Expression Expression Mode: Verbal Expression: 5-Expresses basic 90% of the time/requires cueing < 10% of the time. Social Interaction Social Interaction: 5-Interacts appropriately 90% of the time - Needs monitoring or encouragement for participation or interaction. Problem Solving Problem Solving: 5-Solves complex 90% of the time/cues < 10% of the time Memory Memory: 5-Requires cues to use assistive device FIM - Eating Eating Activity: 5: Set-up assist for open containers  Pain Pain Assessment Pain Assessment: No/denies pain  Therapy/Group: Individual Therapy  Malary Aylesworth 10/24/2012, 2:20  PM

## 2012-10-24 NOTE — Progress Notes (Addendum)
Occupational Therapy Session Note  Patient Details  Name: John Montgomery MRN: [id number] Date of Birth: 08/11/1921  Today's Date: 10/24/2012 Session 1 Time: 0930-1030 Time Calculation (min): 60 min  Short Term Goals: Week 1:  OT Short Term Goal 1 (Week 1): Pt. will maintain static sitting unsupported for 15 minutes with moderate cues during ADL OT Short Term Goal 2 (Week 1): Pt. will bathe UB with miminimal assist OT Short Term Goal 3 (Week 1): Pt. will dress UB with min assist OT Short Term Goal 4 (Week 1): Pt. will stand to donn pants with max assist  Skilled Therapeutic Interventions/Progress Updates:    Pt seated in w/c upon arrival and agreeable to engaging in bathing tasks at walk-in shower level and dressing with sit<>stand from w/c level.  Pt required max A and max verbal cues for stand pivot transfer from w/c to tub bench in shower.  Pt's RLE exhibits increased extensor tone when initiating transitional movements and requires max verbal cues to stop task and sit down.  Pt impulsive with all transitional movements and exhibits decreased safety awareness.  During shower RUE exhibited increased involuntary synergistic movement with increased tone.  Pt stated that he was discouraged with slow progress.  Provided emotional support.  Focus on static/dynamic sitting balance, standing balance, activity tolerance, safety awareness, and transfers.  Therapy Documentation Precautions:  Precautions Precautions: Fall Precaution Comments: Strong lateral lean to Rt Restrictions Weight Bearing Restrictions: Yes  Pain: Pain Assessment Pain Assessment: No/denies pain  See FIM for current functional status  Therapy/Group: Individual Therapy  Session 2 Time: 1300-1330 Pt denies pain Individual Therapy Pt engaged in static and dynamic sitting balance with focus on slow controlled movements. Mirror placed for patient to use in self correction of sitting midline.  Pt continues to require max  verbal cues for sitting at midline.  Pt engaged in weight bearing onto RUE followed by facilitated movement with trace shoulder movement.  Pt continues to exhibit increased impulsivity with transitional movements.  Focus on sitting balance, squat pivot transfers, NMR RUE, and safety awareness.  [name] [facility] 10/24/2012, 10:35 AM

## 2012-10-25 ENCOUNTER — Inpatient Hospital Stay (HOSPITAL_COMMUNITY): Payer: Medicare Other | Admitting: Speech Pathology

## 2012-10-25 ENCOUNTER — Inpatient Hospital Stay (HOSPITAL_COMMUNITY): Payer: Medicare Other

## 2012-10-25 ENCOUNTER — Inpatient Hospital Stay (HOSPITAL_COMMUNITY): Payer: Medicare Other | Admitting: Physical Therapy

## 2012-10-25 DIAGNOSIS — S069X9A Unspecified intracranial injury with loss of consciousness of unspecified duration, initial encounter: Secondary | ICD-10-CM

## 2012-10-25 DIAGNOSIS — W19XXXA Unspecified fall, initial encounter: Secondary | ICD-10-CM

## 2012-10-25 NOTE — Progress Notes (Signed)
Physical Therapy Session Note  Patient Details  Name: John Montgomery MRN: [id number] Date of Birth: April 27, 1921  Today's Date: 10/25/2012 Time: 1101-1205 Time Calculation (min): 64 min  Short Term Goals: Week 1:  PT Short Term Goal 1 (Week 1): bed mobility with mod assist PT Short Term Goal 2 (Week 1): transfers bed to [facility] with mod assist to the left PT Short Term Goal 3 (Week 1): WC mobility mod assist to help steer PT Short Term Goal 4 (Week 1): gait 2 person assist >25' with LRAD PT Short Term Goal 5 (Week 1): Dynamic sitting balance min assist.    Skilled Therapeutic Interventions/Progress Updates:    This session focused on WC mobility and becoming more independent with WC parts x 150' mod assist to help steer WC pt tends to list right due to left side arm and leg steering, but either doesn't have enough strength or enough grip in his shoes to pull harder to steer with his left leg.  Rigged a break extender for his right WC break.  Gait in parallel bars max assist to support trunk, block right leg in stance and progress right leg forward for progressive gait.  2 lengths of the parallel bar completed with PT pulling WC behind for safety.  Pt working on lifting chest throughout gait and finding midline with mirror in static standing.  Level transfers to and from [facility] to mat table to both left and right sides max assist due to continued strong pushing to the right during transitional movements.  Worked on getting pt to anterior lean and slowdown his movements (as well as making them smaller).  I had to put some dysom under his feet due to not very much grip on his current shoes.  Daughter states she will get a better pair and bring them up here.  Repeated transfer to and from mat table at least 6 times and worked on upright posture once sitting on mat table without back support.  Pt continues to be down on himself and say things like, "I must be a disappointment to you" (me the therapist).  "I don't  think I am doing any better than yesterday". I discussed at length with him that healing is slow and certainly much slower than we want it to be and that all we want him to do is keep trying and participating with all the effort that he has and try to stay positive.  I definitely think he would benefit from neruophyc consult.    Therapy Documentation Precautions:  Precautions Precautions: Fall Precaution Comments: Strong lateral lean to Rt Restrictions Weight Bearing Restrictions: No   Pain: Pain Assessment Pain Assessment: 0-10 Pain Score:   5 Pain Type: Acute pain Pain Location: Hip Pain Orientation: Right Pain Intervention(s): Repositioned;Ambulation/increased activity Mobility:   Locomotion : Ambulation Ambulation/Gait Assistance: 2: Max Lawyer Distance: 150   See FIM for current functional status  Therapy/Group: Individual Therapy  Lurena Joiner B. Chinedu Agustin, PT, DPT [phone]   10/25/2012, 2:02 PM

## 2012-10-25 NOTE — Progress Notes (Signed)
Subjective/Complaints: Spasms better. Still frustrated by perceived lack of progress A 12 point review of systems has been performed and if not noted above is otherwise negative.  Objective: Vital Signs: Blood pressure 119/63, pulse 91, temperature 97.5 F (36.4 C), temperature source Oral, resp. rate 19, height 5\' 7"  (1.702 m), weight 61.145 kg (134 lb 12.8 oz), SpO2 99.00%. No results found.  Basename 10/23/12 0802  WBC 7.3  HGB 11.7*  HCT 34.4*  PLT 222    Basename 10/23/12 0802  NA 136  K 4.5  CL 102  GLUCOSE 93  BUN 20  CREATININE 0.91  CALCIUM 9.5   CBG (last 3)  No results found for this basename: GLUCAP:3 in the last 72 hours  Wt Readings from Last 3 Encounters:  10/20/12 61.145 kg (134 lb 12.8 oz)  10/18/12 55.1 kg (121 lb 7.6 oz)    Physical Exam:  Mid occiput area with abrasion and dried blood without change Eyes: Pupils are equal, round, and reactive to light.  Neck: Normal range of motion.  Cardiovascular: Normal rate and regular rhythm.  Pulmonary/Chest: Effort normal and breath sounds normal.  Abdominal: Soft. Bowel sounds are normal.  Musculoskeletal: He exhibits no edema.  Neurological: He is alert and oriented to person, place, and time.  . Pleasant and appropriate. Follows commands without difficulty. Soft speech without dysarthria. Right sided weakness with intermittent flexor spasms of RLE. RUE is 0/5 deltoid, biceps, triceps, grip 3-4/5. RLE is 1 at RHF, RKE and 0/5 at ankle. Senses pain throughout right side.right central 7 and some tongue deviation, but other CN normal. DTR's are 2+ right elbow, knee. Toes non reactive. Cognitively he displays good insight and awareness. Id's simple objects without issue. Occasionally struggles with longer phrases and sentences.  Skin: Skin is warm and dry. A few abrasions on scalp. Psychiatric: He has a normal mood and affect. His speech is normal. Thought content normal. His mood appears not anxious. Cognition  and memory are normal.    Assessment/Plan: 1. 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 Physiatrist is providing close team supervision and 24 hour management of active medical problems listed below. Physiatrist and rehab team continue to assess barriers to discharge/monitor patient progress toward functional and medical goals.  Reassured pt again that it will take some time to regain strength in his arm/leg.  FIM: FIM - Bathing Bathing Steps Patient Completed: Chest;Right Arm;Abdomen Bathing: 2: Max-Patient completes 3-4 19f 10 parts or 25-49%  FIM - Upper Body Dressing/Undressing Upper body dressing/undressing steps patient completed: Thread/unthread left sleeve of front closure shirt/dress Upper body dressing/undressing: 2: Max-Patient completed 25-49% of tasks FIM - Lower Body Dressing/Undressing Lower body dressing/undressing: 1: Total-Patient completed less than 25% of tasks  FIM - Hotel manager Devices: Grab bar or rail for support Toileting: 1: Total-Patient completed zero steps, helper did all 3  FIM - Diplomatic Services operational officer Devices: Psychiatrist Transfers: 1-Two helpers  FIM - Banker Devices: Arm rests Bed/Chair Transfer: 2: Bed > Chair or W/C: Max A (lift and lower assist);2: Chair or W/C > Bed: Max A (lift and lower assist)  FIM - Locomotion: Wheelchair Distance: 150 Locomotion: Wheelchair: 3: Travels 150 ft or more: maneuvers on rugs and over door sills with moderate assistance  (Pt: 50 - 74%) FIM - Locomotion: Ambulation Locomotion: Ambulation Assistive Devices: Parallel bars Ambulation/Gait Assistance:  2: Max assist Locomotion: Ambulation: 1: Travels less than 50 ft with maximal assistance (Pt: 25 - 49%)  Comprehension Comprehension Mode:  Auditory Comprehension: 6-Follows complex conversation/direction: With extra time/assistive device  Expression Expression Mode: Verbal Expression: 5-Expresses complex 90% of the time/cues < 10% of the time  Social Interaction Social Interaction: 6-Interacts appropriately with others with medication or extra time (anti-anxiety, antidepressant).  Problem Solving Problem Solving: 5-Solves complex 90% of the time/cues < 10% of the time  Memory Memory: 6-More than reasonable amt of time  Medical Problem List and Plan:  1. DVT Prophylaxis/Anticoagulation: Mechanical: Sequential compression devices, below knee Bilateral lower extremities  2. Pain Management: N/A  3. Mood: appropriate. Will monitor for now.   -ask neuropsych to see regarding his mood and coping with deficits from injury 4. Neuropsych: This patient is capable of making decisions on his/her own behalf.  5. PAF: resolved. Continue low dose BB with close monitoring of HR for bradycardia as with tendency towards SSS. Keep K+ levels around 4.0.  6. HTN: Monitor with bid checks. Norvasc resumed today.  7. ABLA: hgb climbing  8. RLE spasm: low dose baclofen prn has been helpful  -add prafo to control ankle at night  LOS (Days) 5 A FACE TO FACE EVALUATION WAS PERFORMED  SWARTZ,ZACHARY T 10/25/2012 7:59 AM

## 2012-10-25 NOTE — Progress Notes (Signed)
Speech Language Pathology Daily Session Note  Patient Details  Name: John Montgomery MRN: [id number] Date of Birth: 1921-06-16  Today's Date: 10/25/2012 Time: 0800-0845 Time Calculation (min): 45 min  Short Term Goals: Week 1: SLP Short Term Goal 1 (Week 1): Pt will complete complex problem solving tasks with Mod I.  SLP Short Term Goal 1 - Progress (Week 1): Progressing toward goal SLP Short Term Goal 2 (Week 1): Pt will demonstrate recall of newly learned information with utilization of compensatory strategies with Mod I.  SLP Short Term Goal 2 - Progress (Week 1): Progressing toward goal SLP Short Term Goal 3 (Week 1): Pt will verbalize abstract thoughts with Mod I for word-finding and utilization of word-finding strategies SLP Short Term Goal 3 - Progress (Week 1): Progressing toward goal  Skilled Therapeutic Interventions: Treatment focus on cognitive goals. Pt required supervision question cues with extra time to recall sequence/steps for a safe transfer. Pt also participated in verbal problem solving/reasoning in regards to impulsivity with  moevement and need to increase self-monitoring and correcting to improve  overall safety awareness with supervision question cues.    FIM:  Comprehension Comprehension Mode: Auditory Comprehension: 6-Follows complex conversation/direction: With extra time/assistive device Expression Expression Mode: Verbal Expression: 5-Expresses complex 90% of the time/cues < 10% of the time Social Interaction Social Interaction: 6-Interacts appropriately with others with medication or extra time (anti-anxiety, antidepressant). Problem Solving Problem Solving: 5-Solves complex 90% of the time/cues < 10% of the time Memory Memory: 6-More than reasonable amt of time  Pain Pain Assessment Pain Assessment: No/denies pain  Therapy/Group: Individual Therapy  Anavey Coombes 10/25/2012, 9:58 AM

## 2012-10-25 NOTE — Progress Notes (Signed)
Occupational Therapy Session Note  Patient Details  Name: John Montgomery MRN: [id number] Date of Birth: 08-10-1921  Today's Date: 10/25/2012  Session 1 Time: 0930-1030 Time Calculation (min): 60 min  Short Term Goals: Week 1:  OT Short Term Goal 1 (Week 1): Pt. will maintain static sitting unsupported for 15 minutes with moderate cues during ADL OT Short Term Goal 2 (Week 1): Pt. will bathe UB with miminimal assist OT Short Term Goal 3 (Week 1): Pt. will dress UB with min assist OT Short Term Goal 4 (Week 1): Pt. will stand to donn pants with max assist  Skilled Therapeutic Interventions/Progress Updates:    Pt in bed upon arrival and agreeable to participate in bathing and dressing tasks w/c level at sink.  Pt required max verbal cues for sequencing for transitional movements throughout session.  Pt continues to move quickly and exhibit decreased balance during all transitional movements.  With verbal cues patient can lean forward to facilitate controlled sit->stand with mod A.  Patient continues to exhibit extensor patterns when requested to scoot forward or prepare for sit->stand unless offered verbal cues.  Pt requires max verbal cues to shift weight to left when standing at sink. Focus of transitional movements, static and dynamic sitting balance, static standing balance, safety awareness, and activity tolerance.  Therapy Documentation Precautions:  Precautions Precautions: Fall Precaution Comments: Strong lateral lean to Rt Restrictions Weight Bearing Restrictions: No Pain: Pain Assessment Pain Assessment: No/denies pain  See FIM for current functional status  Therapy/Group: Individual Therapy  Session 2 Time: 1300-1330 Pt denies pain Individual therapy  Pt seated in w/c leaning to right with RUE handing outside of w/c. Pt stated he knew his arm had fallen beside him but he was unable to place it back on tray. Pt had called nursing for assistance. Pt began rolling self  in w/c to gym to engage in sitting activities and sit<>activities.  Focus on maintaining balance at midline and controlling sit->stand employing forward lean to prevent extensor reaction with standing.  Pt practiced sit->stand X 5 without use of LUE to assist with pushing up.  Pt stated that he would use LUE to push with when standing to bear weight through RLE.  Pt practiced sit->stand without using LUE with more controlled results. [name] [facility] 10/25/2012, 10:39 AM

## 2012-10-26 ENCOUNTER — Inpatient Hospital Stay (HOSPITAL_COMMUNITY): Payer: Medicare Other | Admitting: Physical Therapy

## 2012-10-26 ENCOUNTER — Inpatient Hospital Stay (HOSPITAL_COMMUNITY): Payer: Medicare Other

## 2012-10-26 ENCOUNTER — Inpatient Hospital Stay (HOSPITAL_COMMUNITY): Payer: Medicare Other | Admitting: Speech Pathology

## 2012-10-26 NOTE — Consult Note (Signed)
NEUROCOGNITIVE STATUS EXAMINATION - [facility]FIDENTIAL [facility]   [name] is an 78 year old man who was seen for a neurocognitive status examination to assess his emotional state and mental status post-traumatic brain injury.  According to his medical record, he was admitted on 10/17/2012 after being knocked over by a neighbor's dog when he was out for a walk.  His speech was reportedly affected and right-sided weakness was observed.  CCT revealed left frontal lobe IPH with edema and mass effect without shift.  Follow-up CCT revealed minimal increase in left frontal-parietal hematoma.    Emotional Functioning:  During the clinical interview, John Montgomery described feeling discouraged because he does not want to have to depend on his family.  He noted that he has always prided himself in being independent and feels uncomfortable accepting help from others, regardless of how willing they are to help him out.  He will likely require assistance ambulating once he is discharged.  He commented that he also worries about his future, including how much he will recover, because he said that he has been unable to appreciate recovery that others have told him that he has made thus far.  John Montgomery stated that he often awakens in the middle of the night and ruminates about his concerns.  He mentioned that he usually feels better quickly once he is around other people and talking to them.  If he is alone, he will let his emotions run their course, but he feels "relief" as soon as someone else is around.  He denied any history of suicidal ideation, intent, or plan.    John Montgomery's responses to a self-report measure of depressive symptoms was not suggestive of the presence of clinically significant depression.  As such, his depressed mood is likely secondary to an adjustment reaction and is not indicative of significant underlying pathology.    Mental Status:  John Montgomery's total score on an  overall measure of mental status was not suggestive of the presence of dementia (MMSE-2 brief = 14/16).  After immediately registering 3 words into memory, he was able to freely recall 1 of 3 words after a brief delay.  Behaviorally, he was observed to have stuttered, slurred speech and tangential thought processes.     Impressions and Recommendations:  John Montgomery is displaying depressive symptoms, but does not appear to be presenting with a specific underlying depressive disorder.  He reportedly responds well to conversation with others and as such, if the treatment team engages him in conversation, he will likely quickly return to his baseline emotional state.  Notably, conversation does not have to be about what is driving his stress or depression in the moment.  Rather, engaging in any conversation will likely provide him with emotional release.  He said that his favorite conversational topics include: history, politics, genealogy, origin of last names, and general discussions about life.   Given the benefit that he described from engaging in the current session with the neuropsychologist, I will plan to see him again next week to engage in supportive psychotherapy in order to provide an emotional outlet and boost recovery.    DIAGNOSES: Traumatic Brain Injury Adjustment disorder with depressed mood  John Montgomery, Psy.D.  Clinical Neuropsychologist

## 2012-10-26 NOTE — Progress Notes (Addendum)
Physical Therapy Session Note  Patient Details  Name: John Montgomery MRN: [id number] Date of Birth: [birth year]  Today's Date: 10/26/2012 Time: 6295-2841 Time Calculation (min): 63 min  Short Term Goals: Week 1:  PT Short Term Goal 1 (Week 1): bed mobility with mod assist PT Short Term Goal 2 (Week 1): transfers bed to [facility] with mod assist to the left PT Short Term Goal 3 (Week 1): WC mobility mod assist to help steer PT Short Term Goal 4 (Week 1): gait 2 person assist >25' with LRAD PT Short Term Goal 5 (Week 1): Dynamic sitting balance min assist.    Skilled Therapeutic Interventions/Progress Updates:    This session focused on WC mobility, bed mobility and transfers.  WC mobility mod assist to help pt steer due to constant right list (can't pull hard enough with his feet to keep him centered.  WC to mat table transfers repeatedly to both the right and the left side working on which direction to lean his body, pushing with less force in his left arm and leg, slowing down his movements.  He continues to be max assist, but a few times to the right side I would rate him mod assist.  Verbal cues for hand placement verbal and manual assist for lean and weight shift.  Sit <-> side lying both right and left >4 reps each side working on the transition, left leg helping the right leg, WB through right arm and elbow, manual assist at pelvis and trunk to help facilitate the trunk to get to upright sitting on both sides.  He needed assist of both legs and his trunk, so overall max assist although he improved mildly with repetition.  Updated goals for min-mod level see LTGs for details.    Therapy Documentation Precautions:  Precautions Precautions: Fall Precaution Comments: Strong lateral lean to Rt Restrictions Weight Bearing Restrictions: No   Pain: Pain Assessment Pain Assessment: No/denies pain Mobility:   Locomotion : Wheelchair Mobility Distance: 80   See FIM for current functional  status  Therapy/Group: Individual Therapy  Lurena Joiner B. Deegan Valentino, PT, DPT [phone]   10/26/2012, 4:02 PM

## 2012-10-26 NOTE — Progress Notes (Signed)
Physical Therapy Session Note  Patient Details  Name: John Montgomery MRN: [id number] Date of Birth: [birth year]  Today's Date: 10/26/2012  Session 1 Time: 1115-1200 Time Calculation (min): 45 min  Short Term Goals: Week 1:  PT Short Term Goal 1 (Week 1): bed mobility with mod assist PT Short Term Goal 2 (Week 1): transfers bed to [facility] with mod assist to the left PT Short Term Goal 3 (Week 1): WC mobility mod assist to help steer PT Short Term Goal 4 (Week 1): gait 2 person assist >25' with LRAD PT Short Term Goal 5 (Week 1): Dynamic sitting balance min assist.    Skilled Therapeutic Interventions/Progress Updates: Patient's daughter present for session.  Patient performed WC propulsion for increased endurance in controlled environment 67' with mod A to assist with steering due to difficulty steering straight with left LE/UE only.  Patient performed squat pivot transfer WC/mat table with max A +1 with cuing for slowed pace of transfer.  Sitting at edge of mat table with reaching activities to right and left to work on improved dynamic sitting balance with visual cuing (mirror) for 10 minutes.  Patient able to self correct with use of mirror and verbal cues.  Patient performed squat/pivot transfer mat table/WC with max A with verbal cues for pacing and technique.  Additional squat pivot transfers for total of 4x to left and right.  Patient performed WC propulsion for increased endurance back to room with mod A for improved steering.    Pain: Pain Assessment Pain Assessment: No/denies pain   Session 2 Time: 1345-1445 Time Calculation (min): 60 min  Skilled Therapeutic Interventions/Progress Updates: Patient again performed WC propulsion to increase endurance in controlled environment 60' with mod A to assist with steering due to difficulty steering straight with left LE/UE only.  Patient performed squat pivot transfer WC/mat table with max A +1 with cuing required to slow pace of transfer.   Patient performed sitting at edge of mat table with reaching activity to left and across midline to improve dynamic sitting balance and for improved upright posture and trunk strength. Patient worked sitting at edge of mat table with min A while working on  dynamic sitting balance for 12 minutes.  Patient demonstrated improved ability to self correct posture with use of mirror over earlier session.  Patient required max A to perform squat/pivot transfers mat table/WC with verbal cues for pacing and technique, transferring to left and right.  In parallel bars patient performed 4 trials of sit/stand transfer from [facility] with max A to block right knee, support right UE, and provide lift.  Patient stood 2 bouts for 15-20 seconds each, stood 2 bouts with 8 lateral weight shifts as pre-gait activity, shifting over right LE and cuing for upright posture.  Max A required for stand/sit transfer into WC.  Patient transported by staff in [facility] back to room at end of session.  Dysom placed under patient's shoes for all transfer and standing activity due to lack of traction in patient's shoes.    Pain: Pain Assessment Pain Assessment: No/denies pain    See FIM for current functional status  Therapy/Group: Individual Therapy  Rexene Agent 10/26/2012, 12:13 PM

## 2012-10-26 NOTE — Progress Notes (Signed)
Speech Language Pathology Daily Session Note  Patient Details  Name: John Montgomery MRN: [id number] Date of Birth: Dec 02, 1920  Today's Date: 10/26/2012 Time: 4098-1191 Time Calculation (min): 40 min  Short Term Goals: Week 1: SLP Short Term Goal 1 (Week 1): Pt will complete complex problem solving tasks with Mod I.  SLP Short Term Goal 1 - Progress (Week 1): Progressing toward goal SLP Short Term Goal 2 (Week 1): Pt will demonstrate recall of newly learned information with utilization of compensatory strategies with Mod I.  SLP Short Term Goal 2 - Progress (Week 1): Progressing toward goal SLP Short Term Goal 3 (Week 1): Pt will verbalize abstract thoughts with Mod I for word-finding and utilization of word-finding strategies SLP Short Term Goal 3 - Progress (Week 1): Progressing toward goal  Skilled Therapeutic Interventions: Treatment focus on cognitive goals. Pt sequenced 4 and 6 step picture cards with supervision question cues for problem solving and for description of activities in the pictures. Pt also demonstrated selective attention to task for ~30 minutes with Mod I.    FIM:  Comprehension Comprehension Mode: Auditory Comprehension: 5-Understands complex 90% of the time/Cues < 10% of the time Expression Expression Mode: Verbal Expression: 5-Expresses complex 90% of the time/cues < 10% of the time Social Interaction Social Interaction: 6-Interacts appropriately with others with medication or extra time (anti-anxiety, antidepressant). Problem Solving Problem Solving: 5-Solves complex 90% of the time/cues < 10% of the time Memory Memory: 5-Requires cues to use assistive device  Pain Pain Assessment Pain Assessment: No/denies pain  Therapy/Group: Individual Therapy  John Montgomery 10/26/2012, 4:35 PM

## 2012-10-26 NOTE — Progress Notes (Signed)
Occupational Therapy Session Note  Patient Details  Name: John Montgomery MRN: [id number] Date of Birth: Dec 09, 1920  Today's Date: 10/25/12 Time: 1415-1500 Time calculation:  45 minutes   Skilled Therapeutic Interventions/Progress Updates:   Patient seen this pm for an individualized OT appointment to address his awareness and acceptance of midline during simple transitional movements, e.g sit to squat, sit to stand, and scooting left and right.  Patient with limited ability to sustain muscle activation for upright trunk posture.  Patient often falling to left.  However with initial verbal prompt and initial tactile cue, and slow deliberate movement, patient able to maintain upright posture in midline to safely transition through various movements previously listed.  Patient with increased muscle activation in right lower extremity, over exaggerated movement for task.  With repetitive, rhythmic guided movement. Patient able to reduce amount of activity in right hip / thigh to allow transitional movements to occur more safely.  Patient very willing to move with guidance to right, and forward / behind base of support as needed for basic self care tasks.    Therapy Documentation Precautions:  Precautions Precautions: Fall Precaution Comments: Strong lateral lean to Rt Restrictions Weight Bearing Restrictions: No Pain: Pain Assessment Pain Assessment: No/denies pain   See FIM for current functional status  Therapy/Group: Individual Therapy  [name] 10/26/2012, 4:26 PM

## 2012-10-26 NOTE — Progress Notes (Signed)
Subjective/Complaints: Didn't sleep as well last night otherwise no complaints A 12 point review of systems has been performed and if not noted above is otherwise negative.  Objective: Vital Signs: Blood pressure 145/70, pulse 85, temperature 97.6 F (36.4 C), temperature source Oral, resp. rate 20, height 5\' 7"  (1.702 m), weight 61.145 kg (134 lb 12.8 oz), SpO2 96.00%. No results found.  Basename 10/23/12 0802  WBC 7.3  HGB 11.7*  HCT 34.4*  PLT 222    Basename 10/23/12 0802  NA 136  K 4.5  CL 102  GLUCOSE 93  BUN 20  CREATININE 0.91  CALCIUM 9.5   CBG (last 3)  No results found for this basename: GLUCAP:3 in the last 72 hours  Wt Readings from Last 3 Encounters:  10/20/12 61.145 kg (134 lb 12.8 oz)  10/18/12 55.1 kg (121 lb 7.6 oz)    Physical Exam:  Mid occiput area with abrasion and dried blood without change Eyes: Pupils are equal, round, and reactive to light.  Neck: Normal range of motion.  Cardiovascular: Normal rate and regular rhythm.  Pulmonary/Chest: Effort normal and breath sounds normal.  Abdominal: Soft. Bowel sounds are normal.  Musculoskeletal: He exhibits no edema.  Neurological: He is alert and oriented to person, place, and time.  . Pleasant and appropriate. Follows commands without difficulty. Soft speech without dysarthria. Right sided weakness with intermittent flexor spasms of RLE. RUE is 0/5 deltoid,1+ biceps, 1 triceps, grip 3-4/5. RLE is 1 at RHF, RKE and 0/5 at ankle. Senses pain throughout right side.right central 7 and some tongue deviation, but other CN normal. DTR's are 2+ right elbow, knee. Toes non reactive. Cognitively he displays good insight and awareness. Id's simple objects without issue. Occasionally struggles with longer phrases and sentences.  Skin: Skin is warm and dry. A few abrasions on scalp. Psychiatric: He has a normal mood and affect. His speech is normal. Thought content normal. His mood appears not anxious. Cognition and  memory are normal.    Assessment/Plan: 1. 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 Physiatrist is providing close team supervision and 24 hour management of active medical problems listed below. Physiatrist and rehab team continue to assess barriers to discharge/monitor patient progress toward functional and medical goals.   Marland Kitchen  FIM: FIM - Bathing Bathing Steps Patient Completed: Chest;Right Arm;Abdomen;Right upper leg;Left upper leg;Left lower leg (including foot) Bathing: 3: Mod-Patient completes 5-7 33f 10 parts or 50-74%  FIM - Upper Body Dressing/Undressing Upper body dressing/undressing steps patient completed: Put head through opening of pull over shirt/dress;Thread/unthread left sleeve of pullover shirt/dress Upper body dressing/undressing: 3: Mod-Patient completed 50-74% of tasks FIM - Lower Body Dressing/Undressing Lower body dressing/undressing steps patient completed: Thread/unthread left pants leg Lower body dressing/undressing: 1: Total-Patient completed less than 25% of tasks  FIM - Hotel manager Devices: Grab bar or rail for support Toileting: 1: Total-Patient completed zero steps, helper did all 3  FIM - Diplomatic Services operational officer Devices: Psychiatrist Transfers: 1-Two helpers  FIM - Banker Devices: Arm rests Bed/Chair Transfer: 2: Bed > Chair or W/C: Max A (lift and lower assist);2: Chair or W/C > Bed: Max A (lift and lower assist)  FIM - Locomotion: Wheelchair Distance: 150 Locomotion: Wheelchair: 2: Travels 50 - 149 ft with moderate assistance (Pt: 50 - 74%) FIM - Locomotion: Ambulation Locomotion: Ambulation Assistive Devices:  Parallel bars Ambulation/Gait Assistance: 2: Max assist Locomotion: Ambulation: 1: Travels less than 50 ft with maximal  assistance (Pt: 25 - 49%)  Comprehension Comprehension Mode: Auditory Comprehension: 6-Follows complex conversation/direction: With extra time/assistive device  Expression Expression Mode: Verbal Expression: 5-Expresses complex 90% of the time/cues < 10% of the time  Social Interaction Social Interaction: 6-Interacts appropriately with others with medication or extra time (anti-anxiety, antidepressant).  Problem Solving Problem Solving: 5-Solves complex 90% of the time/cues < 10% of the time  Memory Memory: 6-More than reasonable amt of time  Medical Problem List and Plan:  1. DVT Prophylaxis/Anticoagulation: Mechanical: Sequential compression devices, below knee Bilateral lower extremities  2. Pain Management: N/A  3. Mood: appropriate. Will monitor for now.   - neuropsych saw pt yesterday regarding his mood and coping with deficits from injury 4. Neuropsych: This patient is capable of making decisions on his/her own behalf.  5. PAF: resolved. Continue low dose BB with close monitoring of HR for bradycardia as with tendency towards SSS. Keep K+ levels around 4.0.  6. HTN: Monitor with bid checks. Norvasc resumed today.  7. ABLA: hgb climbing  8. RLE spasm: low dose baclofen prn has been helpful  -add prafo to control ankle at night  LOS (Days) 6 A FACE TO FACE EVALUATION WAS PERFORMED  Marguerita Stapp T 10/26/2012 7:53 AM

## 2012-10-26 NOTE — Progress Notes (Signed)
Occupational Therapy Session Note  Patient Details  Name: John Montgomery MRN: [id number] Date of Birth: February 06, 1921  Today's Date: 10/26/2012 Time: 9629-5284 Time Calculation (min): 55 min  Short Term Goals: Week 1:  OT Short Term Goal 1 (Week 1): Pt. will maintain static sitting unsupported for 15 minutes with moderate cues during ADL OT Short Term Goal 2 (Week 1): Pt. will bathe UB with miminimal assist OT Short Term Goal 3 (Week 1): Pt. will dress UB with min assist OT Short Term Goal 4 (Week 1): Pt. will stand to donn pants with max assist  Skilled Therapeutic Interventions/Progress Updates:    Pt in bed upon arrival.  Pt had not eaten breakfast but was agreeable to eating at end of session.  Pt engaged in bathing and dressing tasks with sit<>stand at sink. Pt continues to exhibit impulsiveness with transitional movements and often requires assistance to maintain sitting balance when preparing for transfers.  Pt requires verbal and tactile cues to lean forward during sit<>stand and transitional movements to prevent trunk and RLE extension with movement.  Pt required increased assistance with static standing as session progressed and patient began to rush through tasks with no regard to balance.  Pt stated that he felt he needed to put more weight on LLE when standing and that is why he pushed.  Pt continues to required max verbal cues to maintain static sitting and standing balance.  Focus on transitional movements, compensatory strategies, sitting and standing balance, safety awareness, and activity tolerance.  Therapy Documentation Precautions:  Precautions Precautions: Fall Precaution Comments: Strong lateral lean to Rt Restrictions Weight Bearing Restrictions: No Pain: Pain Assessment Pain Assessment: No/denies pain  See FIM for current functional status  Therapy/Group: Individual Therapy  Rich Brave 10/26/2012, 9:43 AM

## 2012-10-27 ENCOUNTER — Inpatient Hospital Stay (HOSPITAL_COMMUNITY): Payer: Medicare Other | Admitting: Physical Therapy

## 2012-10-27 ENCOUNTER — Inpatient Hospital Stay (HOSPITAL_COMMUNITY): Payer: Medicare Other

## 2012-10-27 ENCOUNTER — Inpatient Hospital Stay (HOSPITAL_COMMUNITY): Payer: Medicare Other | Admitting: Speech Pathology

## 2012-10-27 DIAGNOSIS — W19XXXA Unspecified fall, initial encounter: Secondary | ICD-10-CM

## 2012-10-27 DIAGNOSIS — S069X9A Unspecified intracranial injury with loss of consciousness of unspecified duration, initial encounter: Secondary | ICD-10-CM

## 2012-10-27 NOTE — Progress Notes (Signed)
Occupational Therapy Session Note  Patient Details  Name: John Montgomery MRN: [id number] Date of Birth: 02-Apr-1921  Today's Date: 10/27/2012  Session 1 Time: 0800-0900 Time Calculation (min): 60 min  Short Term Goals: Week 1:  OT Short Term Goal 1 (Week 1): Pt. will maintain static sitting unsupported for 15 minutes with moderate cues during ADL OT Short Term Goal 2 (Week 1): Pt. will bathe UB with miminimal assist OT Short Term Goal 3 (Week 1): Pt. will dress UB with min assist OT Short Term Goal 4 (Week 1): Pt. will stand to donn pants with max assist  Skilled Therapeutic Interventions/Progress Updates:    Pt in bed upon arrival but agreeable to participate in bathing and dressing tasks w/c level at sink.  Pt continues to require max assist with bed mobility (supine<>sit) exhibiting LOB to left with no balance reactions.  Pt required max verbal cues to lean forward during squat pivot transfer to w/c.  Pt continues to exhibit increased extensor tone in trunk and RLE during transitional movements requiring tactile cues and verbal cues for correct technique.  Pt exhibits considerable lean/push to right when standing up at sink and requires max assist for sit<>stand and mod A for static standing balance.  Pt requires verbal cues for upright posture in midline when standing at sink.  Pt continues to say that he is discouraged by his perceived lack of progress.  Focus on static and dynamic sitting balance, static standing balance, sit<>stand, safety awareness, attention to right, and activity tolerance  Therapy Documentation Precautions:  Precautions Precautions: Fall Precaution Comments: Strong lateral lean to Rt Restrictions Weight Bearing Restrictions: No Pain: Pain Assessment Pain Assessment: No/denies pain  See FIM for current functional status  Therapy/Group: Individual Therapy  Session 2 Time: 1100-1130 Pt denies pain Individual Therapy Pt engaged in sitting activities and  sit<>stand activities from EOM in gym.  Pt continues to required max verbal cues to lean forward and push up when performing sit<>stand.  Pt continues to exhibit considerable lean/push to right when performing task and when standing.  Pt exhibits some impulsivity with transitional movements and requires max verbal cues to slow down and control movements.  Focus on sit<>stand and squat pivot transfers w/c<>mat.  [name] [facility] 10/27/2012, 9:03 AM

## 2012-10-27 NOTE — Progress Notes (Signed)
Subjective/Complaints: No new issues. Sleep up and down A 12 point review of systems has been performed and if not noted above is otherwise negative.  Objective: Vital Signs: Blood pressure 164/64, pulse 77, temperature 97.8 F (36.6 C), temperature source Oral, resp. rate 18, height 5\' 7"  (1.702 m), weight 61.145 kg (134 lb 12.8 oz), SpO2 97.00%. No results found. No results found for this basename: WBC:2,HGB:2,HCT:2,PLT:2 in the last 72 hours No results found for this basename: NA:2,K:2,CL:2,CO:2,GLUCOSE:2,BUN:2,CREATININE:2,CALCIUM:2 in the last 72 hours CBG (last 3)  No results found for this basename: GLUCAP:3 in the last 72 hours  Wt Readings from Last 3 Encounters:  10/20/12 61.145 kg (134 lb 12.8 oz)  10/18/12 55.1 kg (121 lb 7.6 oz)    Physical Exam:  Mid occiput area with abrasion and dried blood without change Eyes: Pupils are equal, round, and reactive to light.  Neck: Normal range of motion.  Cardiovascular: Normal rate and regular rhythm.  Pulmonary/Chest: Effort normal and breath sounds normal.  Abdominal: Soft. Bowel sounds are normal.  Musculoskeletal: He exhibits no edema.  Neurological: He is alert and oriented to person, place, and time.  . Pleasant and appropriate. Follows commands without difficulty. Soft speech without dysarthria. Right sided weakness with intermittent flexor spasms of RLE. RUE is 0/5 deltoid,1+ biceps, 1 triceps, grip 3-4/5. RLE is 1 at RHF, RKE and 0/5 at ankle. Senses pain throughout right side.right central 7 and some tongue deviation, but other CN normal. DTR's are 2+ right elbow, knee. Toes non reactive. Cognitively he displays good insight and awareness. Id's simple objects without issue. Occasionally struggles with longer phrases and sentences.  Skin: Skin is warm and dry. A few abrasions on scalp. Psychiatric: He has a normal mood and affect. His speech is normal. Thought content normal. His mood appears not anxious. Cognition and memory  are normal.    Assessment/Plan: 1. 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 Physiatrist is providing close team supervision and 24 hour management of active medical problems listed below. Physiatrist and rehab team continue to assess barriers to discharge/monitor patient progress toward functional and medical goals.   Marland Kitchen  FIM: FIM - Bathing Bathing Steps Patient Completed: Chest;Right Arm;Abdomen;Right upper leg;Left upper leg Bathing: 3: Mod-Patient completes 5-7 66f 10 parts or 50-74%  FIM - Upper Body Dressing/Undressing Upper body dressing/undressing steps patient completed: Thread/unthread left sleeve of pullover shirt/dress;Put head through opening of pull over shirt/dress Upper body dressing/undressing: 3: Mod-Patient completed 50-74% of tasks FIM - Lower Body Dressing/Undressing Lower body dressing/undressing steps patient completed: Thread/unthread left pants leg Lower body dressing/undressing: 1: Total-Patient completed less than 25% of tasks  FIM - Hotel manager Devices: Grab bar or rail for support Toileting: 1: Total-Patient completed zero steps, helper did all 3  FIM - Diplomatic Services operational officer Devices: Psychiatrist Transfers: 1-Two helpers  FIM - Banker Devices: Arm rests Bed/Chair Transfer: 2: Supine > Sit: Max A (lifting assist/Pt. 25-49%);2: Sit > Supine: Max A (lifting assist/Pt. 25-49%);2: Bed > Chair or W/C: Max A (lift and lower assist);2: Chair or W/C > Bed: Max A (lift and lower assist)  FIM - Locomotion: Wheelchair Distance: 80 Locomotion: Wheelchair: 2: Travels 50 - 149 ft with moderate assistance (Pt: 50 - 74%) FIM - Locomotion: Ambulation Locomotion: Ambulation Assistive Devices: Parallel bars Ambulation/Gait Assistance: 2: Max  assist Locomotion: Ambulation: 0:  Activity did not occur  Comprehension Comprehension Mode: Auditory Comprehension: 5-Understands complex 90% of the time/Cues < 10% of the time  Expression Expression Mode: Verbal Expression: 5-Expresses complex 90% of the time/cues < 10% of the time  Social Interaction Social Interaction: 6-Interacts appropriately with others with medication or extra time (anti-anxiety, antidepressant).  Problem Solving Problem Solving: 5-Solves complex 90% of the time/cues < 10% of the time  Memory Memory: 5-Requires cues to use assistive device  Medical Problem List and Plan:  1. DVT Prophylaxis/Anticoagulation: Mechanical: Sequential compression devices, below knee Bilateral lower extremities  2. Pain Management: N/A  3. Mood: appropriate. Will monitor for now.   - neuropsych input and treatment appreciated 4. Neuropsych: This patient is capable of making decisions on his/her own behalf.  5. PAF: resolved. Continue low dose BB with close monitoring of HR for bradycardia as with tendency towards SSS. Keep K+ levels around 4.0.  6. HTN: Monitor with bid checks. Norvasc resumed today.  7. ABLA: hgb climbing  8. RLE spasm: low dose baclofen prn has been helpful  -added prafo to control ankle at night 9. UTI- bactrim  LOS (Days) 7 A FACE TO FACE EVALUATION WAS PERFORMED  SWARTZ,ZACHARY T 10/27/2012 8:19 AM

## 2012-10-27 NOTE — Progress Notes (Addendum)
Occupational Therapy Weekly Progress Note  Patient Details  Name: John Montgomery MRN: [id number] Date of Birth: [birth year]  Today's Date: 10/27/2012  Patient has met 3 of 4 short term goals.  Pt has made slow progress with bathing, dressing, toilet transfers, and shower transfers since admission.  Pt requires mod verbal cues to maintain sitting posture in midline during ADLs.  Pt exhibits extensor tone in trunk and RLE during transitional movements and requires max verbal and tactile cues to complete squat pivot transfers and sit<>stand to perform functional tasks. Patient continues to demonstrate the following deficits: decreased self care, balance, midline control, transfers.  Will continue to benefit from skilled OT intervention to enhance overall performance with BADL and iADL.  Patient progressing toward long term goals..  Continue plan of care.  OT Short Term Goals Week 1:  OT Short Term Goal 1 (Week 1): Pt. will maintain static sitting unsupported for 15 minutes with moderate cues during ADL OT Short Term Goal 1 - Progress (Week 1): Met OT Short Term Goal 2 (Week 1): Pt. will bathe UB with miminimal assist OT Short Term Goal 2 - Progress (Week 1): Met OT Short Term Goal 3 (Week 1): Pt. will dress UB with min assist OT Short Term Goal 3 - Progress (Week 1): Progressing toward goal OT Short Term Goal 4 (Week 1): Pt. will stand to donn pants with max assist OT Short Term Goal 4 - Progress (Week 1): Met Week 2:  OT Short Term Goal 1 (Week 2): Pt. will dress UB with min assist  OT Short Term Goal 2 (Week 2): Pt will dress LB with mod A OT Short Term Goal 3 (Week 2): Pt will perform toilet transfer to [facility] with mod A OT Short Term Goal 4 (Week 2): Pt will perform toileting with max A  Skilled Therapeutic Interventions/Progress Updates:    Pt met 3 out of 4 STGs for week one.  He requires max to mod assist with donning pullover shirt due to decreased postural control and weakness.  Will  continue addressing posture, balance, and strengthening in sessions.    Therapy Documentation Precautions:  Precautions Precautions: Fall Precaution Comments: Strong lateral lean to Rt Restrictions Weight Bearing Restrictions: No    Pain: Pain Assessment Pain Assessment: No/denies pain ADL: ADL Eating: Supervision/safety Where Assessed-Eating: Wheelchair Grooming: Minimal assistance Where Assessed-Grooming: Sitting at sink Upper Body Bathing: Minimal assistance Where Assessed-Upper Body Bathing: Sitting at sink Lower Body Bathing: Moderate assistance Where Assessed-Lower Body Bathing: Sitting at sink;Standing at sink Upper Body Dressing: Moderate assistance Where Assessed-Upper Body Dressing: Sitting at sink Lower Body Dressing: Maximal assistance Where Assessed-Lower Body Dressing: Standing at sink;Sitting at sink Toileting: Dependent Where Assessed-Toileting: Bedside Commode Toilet Transfer: Maximal assistance Toilet Transfer Method: Squat pivot Toilet Transfer Equipment: Psychiatric nurse: Maximal assistance Film/video editor Method: Warden/ranger: Shower seat with back  See FIM for current functional status  Therapy/Group: Individual Therapy  John Montgomery 10/27/2012, 1:22 PM

## 2012-10-27 NOTE — Progress Notes (Signed)
Physical Therapy Session Note  Patient Details  Name: John Montgomery MRN: [id number] Date of Birth: [birth year]  Today's Date: 10/27/2012 Time: 4098-1191 Time Calculation (min): 62 min  Short Term Goals: Week 1:  PT Short Term Goal 1 (Week 1): bed mobility with mod assist PT Short Term Goal 2 (Week 1): transfers bed to [facility] with mod assist to the left PT Short Term Goal 3 (Week 1): WC mobility mod assist to help steer PT Short Term Goal 4 (Week 1): gait 2 person assist >25' with LRAD PT Short Term Goal 5 (Week 1): Dynamic sitting balance min assist.    Skilled Therapeutic Interventions/Progress Updates:  WC propulsion in controlled environment with mod A 11' x2 with assistance required to maintain straight path, although patient has new shoes with improved traction which improves his ability to propel WC in straight path.  Squat pivot transfer to left and right WC/mat table x4 with max A.  Patient demonstrated improved pacing during transfer and new shoes improved foot control on floor.  Sit/sidelying transfer with mod A to elevate bilateral LEs.  Cuing to patient during sidelying/sit transfer to weight bear through right arm and elbow to assist in transfer.  Max A required to assist with trunk elevation and lowering bilateral LEs.  On mat table patient performed bridging for improved trunk and hip strengthening 10 reps x2 sets with assistance to stabilize right LE and pacing assistance to slow him during exercise.  Patient performed resisted hip/knee extension in supine to increase hip/knee extensor strength 10 reps x2 sets with rest break between sets.    In parallel bars patient performed 2 trials of sit/stand transfer from [facility] with max A to block right knee, support right UE, and provide lift. Patient stood 2 bouts for 60-70 seconds each with cuing for upright posture.     Therapy Documentation Precautions:  Precautions Precautions: Fall Precaution Comments: Strong lateral lean to  Rt Restrictions Weight Bearing Restrictions: No Pain: Pain Assessment Pain Assessment: No/denies pain  See FIM for current functional status  Therapy/Group: Individual Therapy  Rexene Agent 10/27/2012, 11:54 AM

## 2012-10-27 NOTE — Progress Notes (Signed)
Physical Therapy Note  Patient Details  Name: John Montgomery MRN: [id number] Date of Birth: 04-08-1921 Today's Date: 10/27/2012  2:40 - 3:35 55 minutes Individual session Patient denies pain.   Treatment session focused on sit to stand, standing balance, and gait with railing in hallway. Patient worked on sit to stand and static stance in parallel bars using mirror as feedback to correct posture. Patient tends to push to right and stand with flexed hips looking down at feet. Attempted stepping forward and back with each LE. Patient with difficulty progressing right LE. Patient requires manual facilitation to weight shift to left to unweight right LE. Also used ace wrap to facilitate dorsiflexion to assist with stride on right. Patient ambulated 35 feet x 2 with railing in hallway and max assist to facilitate weight shifting and progressing right LE.  Patient can initiate progressing LE forward but right hip tends to adduct and externally rotate. Patient needs assistance with progressing and proper placement of right LE.  Patient propelled wheelchair 150 feet with minimal cueing/assist to avoid objects on right.     [name] 10/27/2012, 4:11 PM

## 2012-10-27 NOTE — Progress Notes (Signed)
Speech Language Pathology Daily Session Note  Patient Details  Name: John Montgomery MRN: [id number] Date of Birth: [birth year]  Today's Date: 10/27/2012 Time: 1350-1430 Time Calculation (min): 40 min  Short Term Goals: Week 1: SLP Short Term Goal 1 (Week 1): Pt will complete complex problem solving tasks with Mod I.  SLP Short Term Goal 1 - Progress (Week 1): Progressing toward goal SLP Short Term Goal 2 (Week 1): Pt will demonstrate recall of newly learned information with utilization of compensatory strategies with Mod I.  SLP Short Term Goal 2 - Progress (Week 1): Progressing toward goal SLP Short Term Goal 3 (Week 1): Pt will verbalize abstract thoughts with Mod I for word-finding and utilization of word-finding strategies SLP Short Term Goal 3 - Progress (Week 1): Progressing toward goal  Skilled Therapeutic Interventions: Treatment focus on cognitive goals. Pt participated in basic kitchen task of making pudding. Pt required Min question cues for functional problem solving with task. Pt also required Min visual and verbal cues for path finding back to his room from the ADL apartment. Pt's daughter present for session.    FIM:  Comprehension Comprehension Mode: Auditory Comprehension: 5-Understands complex 90% of the time/Cues < 10% of the time Expression Expression Mode: Verbal Expression: 5-Expresses complex 90% of the time/cues < 10% of the time Social Interaction Social Interaction: 6-Interacts appropriately with others with medication or extra time (anti-anxiety, antidepressant). Problem Solving Problem Solving: 5-Solves basic 90% of the time/requires cueing < 10% of the time Memory Memory: 5-Recognizes or recalls 90% of the time/requires cueing < 10% of the time  Pain Pain Assessment Pain Assessment: No/denies pain  Therapy/Group: Individual Therapy  Daniil Labarge 10/27/2012, 3:59 PM

## 2012-10-28 ENCOUNTER — Inpatient Hospital Stay (HOSPITAL_COMMUNITY): Payer: Medicare Other | Admitting: Occupational Therapy

## 2012-10-28 DIAGNOSIS — W19XXXA Unspecified fall, initial encounter: Secondary | ICD-10-CM

## 2012-10-28 DIAGNOSIS — S069X9A Unspecified intracranial injury with loss of consciousness of unspecified duration, initial encounter: Secondary | ICD-10-CM

## 2012-10-28 NOTE — Plan of Care (Signed)
Problem: RH BOWEL ELIMINATION Goal: RH STG MANAGE BOWEL WITH ASSISTANCE STG Manage Bowel with min Assistance.  Outcome: Not Progressing LBM 10-23-12 Goal: RH STG MANAGE BOWEL W/MEDICATION W/ASSISTANCE STG Manage Bowel with Medication with min Assistance.  Outcome: Not Progressing LBM 10-23-12 Goal: RH STG MANAGE BOWEL W/EQUIPMENT W/ASSISTANCE STG Manage Bowel With Equipment With min Assistance  Outcome: Not Progressing LBM 10-23-12

## 2012-10-28 NOTE — Progress Notes (Addendum)
Occupational Therapy Session Note  Patient Details  Name: John Montgomery MRN: [id number] Date of Birth: 1921-04-01  Today's Date: 10/28/2012 Time: 1430-1510 Time Calculation (min): 40 min  Short Term Goals: Week 1:  OT Short Term Goal 1 (Week 1): Pt. will maintain static sitting unsupported for 15 minutes with moderate cues during ADL OT Short Term Goal 1 - Progress (Week 1): Met OT Short Term Goal 2 (Week 1): Pt. will bathe UB with miminimal assist OT Short Term Goal 2 - Progress (Week 1): Met OT Short Term Goal 3 (Week 1): Pt. will dress UB with min assist OT Short Term Goal 3 - Progress (Week 1): Progressing toward goal OT Short Term Goal 4 (Week 1): Pt. will stand to donn pants with max assist OT Short Term Goal 4 - Progress (Week 1): Met  Week 2:  OT Short Term Goal 1 (Week 2): Pt. will dress UB with min assist  OT Short Term Goal 2 (Week 2): Pt will dress LB with mod A OT Short Term Goal 3 (Week 2): Pt will perform toilet transfer to [facility] with mod A OT Short Term Goal 4 (Week 2): Pt will perform toileting with max A  Skilled Therapeutic Interventions/Progress Updates:  Patient found seated in recliner. Therapeutic exercise focusing on dynamic sitting balance/tolerance/endurance, PROM -> right shoulder and elbow, AROM -> right forearm, wrist, and fingers and recliner -> w/c stand pivot transfer with total assist X2 (patient =50%). Patient with extensor tone during transfer, therefore needing X2 assist. Patients daughter present during entire session, therapist educated daughter on PROM -> RUE. At end of session left patient seated in w/c with arm tray and elevating leg rest -> right side. Call bell and phone left within reach.   Precautions:  Precautions Precautions: Fall Precaution Comments: Strong lateral lean to Rt Restrictions Weight Bearing Restrictions: No  ADL: ADL Eating: Supervision/safety Where Assessed-Eating: Wheelchair Grooming: Minimal assistance Where  Assessed-Grooming: Sitting at sink Upper Body Bathing: Minimal assistance Where Assessed-Upper Body Bathing: Sitting at sink Lower Body Bathing: Moderate assistance Where Assessed-Lower Body Bathing: Sitting at sink;Standing at sink Upper Body Dressing: Moderate assistance Where Assessed-Upper Body Dressing: Sitting at sink Lower Body Dressing: Maximal assistance Where Assessed-Lower Body Dressing: Standing at sink;Sitting at sink Toileting: Dependent Where Assessed-Toileting: Bedside Commode Toilet Transfer: Maximal assistance Toilet Transfer Method: Squat pivot Toilet Transfer Equipment: Psychiatric nurse: Maximal assistance Film/video editor Method: Warden/ranger: Shower seat with back  See FIM for current functional status  Therapy/Group: Individual Therapy  Truth Wolaver 10/28/2012, 3:14 PM

## 2012-10-28 NOTE — Progress Notes (Signed)
Patient ID: CRISTON CHANCELLOR, male   DOB: 1921/02/07, 78 y.o.   MRN: [id number] Subjective/Complaints: No new issues. Sleep up and down A 12 point review of systems has been performed and if not noted above is otherwise negative.  Objective: Vital Signs: Blood pressure 166/68, pulse 84, temperature 97.7 F (36.5 C), temperature source Oral, resp. rate 18, height 5\' 7"  (1.702 m), weight 61.145 kg (134 lb 12.8 oz), SpO2 97.00%. No results found. No results found for this basename: WBC:2,HGB:2,HCT:2,PLT:2 in the last 72 hours No results found for this basename: NA:2,K:2,CL:2,CO:2,GLUCOSE:2,BUN:2,CREATININE:2,CALCIUM:2 in the last 72 hours CBG (last 3)  No results found for this basename: GLUCAP:3 in the last 72 hours  Wt Readings from Last 3 Encounters:  10/20/12 61.145 kg (134 lb 12.8 oz)  10/18/12 55.1 kg (121 lb 7.6 oz)    Physical Exam:  Mid occiput area with abrasion and dried blood without change Eyes: Pupils are equal, round, and reactive to light.  Neck: Normal range of motion.  Cardiovascular: Normal rate and regular rhythm.  Pulmonary/Chest: Effort normal and breath sounds normal.  Abdominal: Soft. Bowel sounds are normal.  Musculoskeletal: He exhibits no edema.  Neurological: He is alert and oriented to person, place, and time.  . Pleasant and appropriate. Follows commands without difficulty. Soft speech without dysarthria.+ Word finding deficits Right sided weakness with intermittent flexor spasms of RLE. RUE is 0/5 deltoid,1+ biceps, 1 triceps, grip 3-4/5. RLE is 1 at RHF,2- RKE and 0/5 at ankle. Senses pain throughout right side.right central 7 and some tongue deviation, but other CN normal. DTR's are 2+ right elbow, knee. Toes non reactive. Cognitively he displays good insight and awareness. Id's simple objects without issue. Occasionally struggles with longer phrases and sentences.  Skin: Skin is warm and dry. A few abrasions on scalp. Psychiatric: He has a normal mood and  affect. His speech is normal. Thought content normal. His mood appears not anxious. Cognition and memory are normal.    Assessment/Plan: 1. 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 Physiatrist is providing close team supervision and 24 hour management of active medical problems listed below. Physiatrist and rehab team continue to assess barriers to discharge/monitor patient progress toward functional and medical goals.   Marland Kitchen  FIM: FIM - Bathing Bathing Steps Patient Completed: Chest;Right Arm;Abdomen;Right upper leg;Left upper leg Bathing: 3: Mod-Patient completes 5-7 17f 10 parts or 50-74%  FIM - Upper Body Dressing/Undressing Upper body dressing/undressing steps patient completed: Thread/unthread left sleeve of pullover shirt/dress Upper body dressing/undressing: 2: Max-Patient completed 25-49% of tasks FIM - Lower Body Dressing/Undressing Lower body dressing/undressing steps patient completed: Thread/unthread left pants leg;Don/Doff left shoe Lower body dressing/undressing: 2: Max-Patient completed 25-49% of tasks  FIM - Hotel manager Devices: Grab bar or rail for support Toileting: 1: Total-Patient completed zero steps, helper did all 3  FIM - Diplomatic Services operational officer Devices: Psychiatrist Transfers: 1-Two helpers  FIM - Banker Devices: Arm rests Bed/Chair Transfer: 2: Bed > Chair or W/C: Max A (lift and lower assist);2: Chair or W/C > Bed: Max A (lift and lower assist)  FIM - Locomotion: Wheelchair Distance: 80 Locomotion: Wheelchair: 4: Travels 150 ft or more: maneuvers on rugs and over door sillls with minimal assistance (Pt.>75%) FIM - Locomotion: Ambulation Locomotion: Ambulation Assistive Devices: Other (comment) (railing in hallway) Ambulation/Gait  Assistance: 2: Max assist  Locomotion: Ambulation: 1: Travels less than 50 ft with maximal assistance (Pt: 25 - 49%)  Comprehension Comprehension Mode: Auditory Comprehension: 5-Understands complex 90% of the time/Cues < 10% of the time  Expression Expression Mode: Verbal Expression: 5-Expresses complex 90% of the time/cues < 10% of the time  Social Interaction Social Interaction: 6-Interacts appropriately with others with medication or extra time (anti-anxiety, antidepressant).  Problem Solving Problem Solving: 5-Solves basic 90% of the time/requires cueing < 10% of the time  Memory Memory: 5-Recognizes or recalls 90% of the time/requires cueing < 10% of the time  Medical Problem List and Plan:  1. DVT Prophylaxis/Anticoagulation: Mechanical: Sequential compression devices, below knee Bilateral lower extremities  2. Pain Management: N/A  3. Mood: appropriate. Will monitor for now.   - neuropsych input and treatment appreciated 4. Neuropsych: This patient is capable of making decisions on his/her own behalf.  5. PAF: resolved. Continue low dose BB with close monitoring of HR for bradycardia as with tendency towards SSS. Keep K+ levels around 4.0.  6. HTN: Monitor with bid checks. Norvasc resumed today.  7. ABLA: hgb climbing  8. RLE spasm: low dose baclofen prn has been helpful  -added prafo to control ankle at night 9. UTI- bactrim  LOS (Days) 8 A FACE TO FACE EVALUATION WAS PERFORMED  KIRSTEINS,ANDREW E 10/28/2012 7:59 AM

## 2012-10-29 ENCOUNTER — Inpatient Hospital Stay (HOSPITAL_COMMUNITY): Payer: Medicare Other | Admitting: *Deleted

## 2012-10-29 NOTE — Progress Notes (Signed)
Patient ID: HAN VEJAR, male   DOB: 01-Mar-1921, 78 y.o.   MRN: [id number] Subjective/Complaints: No new issues. Sleep up and down A 12 point review of systems has been performed and if not noted above is otherwise negative.  Objective: Vital Signs: Blood pressure 144/56, pulse 82, temperature 98.1 F (36.7 C), temperature source Oral, resp. rate 19, height 5\' 7"  (1.702 m), weight 61.145 kg (134 lb 12.8 oz), SpO2 95.00%. No results found. No results found for this basename: WBC:2,HGB:2,HCT:2,PLT:2 in the last 72 hours No results found for this basename: NA:2,K:2,CL:2,CO:2,GLUCOSE:2,BUN:2,CREATININE:2,CALCIUM:2 in the last 72 hours CBG (last 3)  No results found for this basename: GLUCAP:3 in the last 72 hours  Wt Readings from Last 3 Encounters:  10/20/12 61.145 kg (134 lb 12.8 oz)  10/18/12 55.1 kg (121 lb 7.6 oz)    Physical Exam:  Mid occiput area with abrasion and dried blood without change Eyes: Pupils are equal, round, and reactive to light.  Neck: Normal range of motion.  Cardiovascular: Normal rate and regular rhythm.  Pulmonary/Chest: Effort normal and breath sounds normal.  Abdominal: Soft. Bowel sounds are normal.  Musculoskeletal: He exhibits no edema.  Neurological: He is alert and oriented to person, place, and time.  . Pleasant and appropriate. Follows commands without difficulty. Soft speech without dysarthria.+ Word finding deficits Right sided weakness with intermittent flexor spasms of RLE. RUE is 0/5 deltoid,1+ biceps, 1 triceps, grip 3-4/5. RLE is 1 at RHF,2- RKE and 0/5 at ankle. Senses pain throughout right side.right central 7 and some tongue deviation, but other CN normal. DTR's are 2+ right elbow, knee. Toes non reactive. Cognitively he displays good insight and awareness. Id's simple objects without issue. Occasionally struggles with longer phrases and sentences.  Skin: Skin is warm and dry. A few abrasions on scalp. Psychiatric: He has a normal mood and  affect. His speech is normal. Thought content normal. His mood appears not anxious. Cognition and memory are normal.    Assessment/Plan: 1. 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 Physiatrist is providing close team supervision and 24 hour management of active medical problems listed below. Physiatrist and rehab team continue to assess barriers to discharge/monitor patient progress toward functional and medical goals.   Marland Kitchen  FIM: FIM - Bathing Bathing Steps Patient Completed: Chest;Right Arm;Abdomen;Right upper leg;Left upper leg Bathing: 3: Mod-Patient completes 5-7 20f 10 parts or 50-74%  FIM - Upper Body Dressing/Undressing Upper body dressing/undressing steps patient completed: Thread/unthread left sleeve of pullover shirt/dress Upper body dressing/undressing: 2: Max-Patient completed 25-49% of tasks FIM - Lower Body Dressing/Undressing Lower body dressing/undressing steps patient completed: Thread/unthread left pants leg;Don/Doff left shoe Lower body dressing/undressing: 2: Max-Patient completed 25-49% of tasks  FIM - Hotel manager Devices: Grab bar or rail for support Toileting: 1: Total-Patient completed zero steps, helper did all 3  FIM - Diplomatic Services operational officer Devices: Psychiatrist Transfers: 1-Two helpers  FIM - Banker Devices: Arm rests Bed/Chair Transfer: 1: Two helpers  FIM - Locomotion: Wheelchair Distance: 80 Locomotion: Wheelchair: 4: Travels 150 ft or more: maneuvers on rugs and over door sillls with minimal assistance (Pt.>75%) FIM - Locomotion: Ambulation Locomotion: Ambulation Assistive Devices: Other (comment) (railing in hallway) Ambulation/Gait Assistance: 2: Max assist Locomotion: Ambulation: 1: Travels less than 50 ft with maximal assistance  (Pt: 25 - 49%)  Comprehension Comprehension Mode: Auditory  Comprehension: 5-Understands complex 90% of the time/Cues < 10% of the time  Expression Expression Mode: Verbal Expression: 5-Expresses basic needs/ideas: With extra time/assistive device  Social Interaction Social Interaction: 6-Interacts appropriately with others with medication or extra time (anti-anxiety, antidepressant).  Problem Solving Problem Solving: 5-Solves basic 90% of the time/requires cueing < 10% of the time  Memory Memory: 5-Recognizes or recalls 90% of the time/requires cueing < 10% of the time  Medical Problem List and Plan:  1. DVT Prophylaxis/Anticoagulation: Mechanical: Sequential compression devices, below knee Bilateral lower extremities  2. Pain Management: N/A  3. Mood: appropriate. Will monitor for now.   - neuropsych input and treatment appreciated 4. Neuropsych: This patient is capable of making decisions on his/her own behalf.  5. PAF: resolved. Continue low dose BB with close monitoring of HR for bradycardia as with tendency towards SSS. Keep K+ levels around 4.0.  6. HTN: Monitor with bid checks. Norvasc resumed.  7. ABLA: hgb climbing  8. RLE spasm: low dose baclofen prn has been helpful  -added prafo to control ankle at night 9. UTI- bactrim  LOS (Days) 9 A FACE TO FACE EVALUATION WAS PERFORMED  Erick Colace 10/29/2012 8:18 AM

## 2012-10-29 NOTE — Progress Notes (Signed)
Physical Therapy Note  Patient Details  Name: John Montgomery MRN: [id number] Date of Birth: 11-05-1921 Today's Date: 10/29/2012  Patient missed 30 minutes of physical therapy this PM secondary to attending Stroke Support Group Christmas Party. Patient reports he would like to stay at party "to keep his mind occupied." Will follow up as able.  Zella Richer Revan Gendron S. Mairlyn Tegtmeyer, PT, DPT  10/29/2012, 3:22 PM

## 2012-10-30 ENCOUNTER — Inpatient Hospital Stay (HOSPITAL_COMMUNITY): Payer: Medicare Other | Admitting: Occupational Therapy

## 2012-10-30 ENCOUNTER — Inpatient Hospital Stay (HOSPITAL_COMMUNITY): Payer: Medicare Other | Admitting: Physical Therapy

## 2012-10-30 ENCOUNTER — Inpatient Hospital Stay (HOSPITAL_COMMUNITY): Payer: Medicare Other | Admitting: Speech Pathology

## 2012-10-30 ENCOUNTER — Inpatient Hospital Stay (HOSPITAL_COMMUNITY): Payer: Medicare Other

## 2012-10-30 DIAGNOSIS — S069X9A Unspecified intracranial injury with loss of consciousness of unspecified duration, initial encounter: Secondary | ICD-10-CM

## 2012-10-30 DIAGNOSIS — W19XXXA Unspecified fall, initial encounter: Secondary | ICD-10-CM

## 2012-10-30 LAB — GLUCOSE, CAPILLARY: Glucose-Capillary: 93 mg/dL (ref 70–99)

## 2012-10-30 MED ORDER — HYDROCERIN EX CREA
TOPICAL_CREAM | Freq: Two times a day (BID) | CUTANEOUS | Status: DC
  Administered 2012-10-30 – 2012-11-06 (×13): via TOPICAL
  Administered 2012-11-07: 1 via TOPICAL
  Administered 2012-11-07 – 2012-11-09 (×4): via TOPICAL
  Filled 2012-10-30 (×2): qty 113

## 2012-10-30 MED ORDER — WHITE PETROLATUM GEL
Status: AC
  Filled 2012-10-30: qty 5

## 2012-10-30 NOTE — Progress Notes (Signed)
Occupational Therapy Session Note  Patient Details  Name: [name] MRN: [id number] Date of Birth: 03/17/1921  Today's Date: 10/30/2012 Time: 0700-0755 Time Calculation (min): 55 min  Short Term Goals: Week 2:  OT Short Term Goal 1 (Week 2): Pt. will dress UB with min assist  OT Short Term Goal 2 (Week 2): Pt will dress LB with mod A OT Short Term Goal 3 (Week 2): Pt will perform toilet transfer to [facility] with mod A OT Short Term Goal 4 (Week 2): Pt will perform toileting with max A  Skilled Therapeutic Interventions/Progress Updates:    Pt in bed attempting to position urinal.  Pt required assistance to complete task.  Pt performed supine to sit with HOB raised - mod A to maintain sitting balance after sitting EOB.  Pt continues to exhibit decreased static sitting balance and pushes to right with LUE.  Pt required max verbal cues for positioning in preparation for squat pivot transfer to w/c.  Pt engaged in bathing and dressing tasks w/c level at sink with focus on attention to right/RUE, static and dynamic sitting balance, compensatory techniques, and static standing balance. Pt required max verbal cues for preparation for sit->stand and to shift weight to left when standing.  Pt continues to push through LUE when standing and requires max verbal cues to shift weight, standt upright, and hold head up.    Therapy Documentation Precautions:  Precautions Precautions: Fall Precaution Comments: Strong lateral lean to Rt Restrictions Weight Bearing Restrictions: No Pain: Pain Assessment Pain Assessment: No/denies pain  See FIM for current functional status  Therapy/Group: Individual Therapy  Rich Brave 10/30/2012, 7:57 AM

## 2012-10-30 NOTE — Progress Notes (Signed)
Speech Language Pathology Weekly & Session Progress Notes  Patient Details  Name: John Montgomery MRN: [id number] Date of Birth: 21-Oct-1921  Today's Date: 10/30/2012 Time: 4098-1191 Time Calculation (min): 45 min  Short Term Goals: Week 1: SLP Short Term Goal 1 (Week 1): Pt will complete complex problem solving tasks with Mod I.  SLP Short Term Goal 1 - Progress (Week 1): Not met SLP Short Term Goal 2 (Week 1): Pt will demonstrate recall of newly learned information with utilization of compensatory strategies with Mod I.  SLP Short Term Goal 2 - Progress (Week 1): Not met SLP Short Term Goal 3 (Week 1): Pt will verbalize abstract thoughts with Mod I for word-finding and utilization of word-finding strategies SLP Short Term Goal 3 - Progress (Week 1): Not met Week 2: SLP Short Term Goal 1 (Week 2): Pt will demonstrate basic problem solving with functional tasks with Min verbal cues  SLP Short Term Goal 2 (Week 2): Pt will recall new, daily information with supervision verbal cues.  SLP Short Term Goal 3 (Week 2): Pt will demonstrate mildly complex thoughts with supervision verbal cues with extra time.   Weekly Progress Updates: Pt has made minimal gains and has not met any STG's this reporting period. Currently, pt's verbal abilities are greater than his functional abilities and pt requires Min verbal cues in regards to emergent awareness, functional problem solving and recall of new information with functional tasks. Pt demonstrates increased word-finding but requires extra time for expression of wants/needs.  Pt would benefit from continued skilled SLP intervention to maximize cognitive function and overall functional independence.    SLP Intensity: Minumum of 1-2 x/day, 30 to 90 minutes SLP Frequency: 5 out of 7 days SLP Duration/Estimated Length of Stay: 1 week  SLP Treatment/Interventions: Cognitive remediation/compensation;Cueing hierarchy;Functional tasks;Patient/family  education;Therapeutic Activities;Speech/Language facilitation;Environmental controls;Internal/external aids  Daily Session Skilled Therapeutic Intervention: Treatment focus on word-finding. Pt participated in task to increase word-finding and problem solving when given specific descriptions of words. Pt required Min verbal cues for accurate descriptions of words and required Mod verbal cues for word-finding when given clues.  FIM:  Comprehension Comprehension Mode: Auditory Comprehension: 5-Understands complex 90% of the time/Cues < 10% of the time Expression Expression Mode: Verbal Expression: 5-Expresses basic needs/ideas: With extra time/assistive device Social Interaction Social Interaction: 6-Interacts appropriately with others with medication or extra time (anti-anxiety, antidepressant). Problem Solving Problem Solving: 5-Solves basic 90% of the time/requires cueing < 10% of the time Memory Memory: 5-Recognizes or recalls 90% of the time/requires cueing < 10% of the time Pain Pain Assessment Pain Assessment: No/denies pain  Therapy/Group: Individual Therapy  Julian Askin 10/30/2012, 4:11 PM

## 2012-10-30 NOTE — Progress Notes (Signed)
Occupational Therapy Session Note  Patient Details  Name: John Montgomery MRN: [id number] Date of Birth: 28-May-1921  Today's Date: 10/30/2012 Time: 1130-1200 Time Calculation (min): 30 min  Short Term Goals: Week 1:  OT Short Term Goal 1 (Week 1): Pt. will maintain static sitting unsupported for 15 minutes with moderate cues during ADL OT Short Term Goal 1 - Progress (Week 1): Met OT Short Term Goal 2 (Week 1): Pt. will bathe UB with miminimal assist OT Short Term Goal 2 - Progress (Week 1): Met OT Short Term Goal 3 (Week 1): Pt. will dress UB with min assist OT Short Term Goal 3 - Progress (Week 1): Progressing toward goal OT Short Term Goal 4 (Week 1): Pt. will stand to donn pants with max assist OT Short Term Goal 4 - Progress (Week 1): Met  Skilled Therapeutic Interventions/Progress Updates:    1:1 kitchen task of taking carrots out of bag and washing them and putting them into Ziploc bag standing at the sink. Focus on sit to stands, standing balance maintaining upright balance, postural adjustments/ self correction with max cuing throughout session for attention to balance, functional use of right UE (functional reach and grasp and release, safe placement of right UE, difficulty with quality of movement- no just trying to just complete tasks, maneuvering w/c around kitchen to put items in fridge and return to room.  Therapy Documentation Precautions:  Precautions Precautions: Fall Precaution Comments: Strong lateral lean to Rt, due to pushing Required Braces or Orthoses: Other Brace/Splint Other Brace/Splint: trialing some AFOs right foot Restrictions Weight Bearing Restrictions: No Pain:  no c/o pain  See FIM for current functional status  Therapy/Group: Individual Therapy  Roney Mans [facility] 10/30/2012, 2:56 PM

## 2012-10-30 NOTE — Progress Notes (Signed)
INITIAL NUTRITION ASSESSMENT  DOCUMENTATION CODES Per approved criteria  -Not Applicable    INTERVENTION: Continue current diet.  Consider a supplement if intake/weight decrease.  NUTRITION DIAGNOSIS: None at this time.  Goal: Intake of >90% meals.  Monitor:  Intake, labs, weight  Reason for Assessment: LOS  78 y.o. male  Admitting Dx: SDH (subdural hematoma)  ASSESSMENT: Patient reports good appetite and feels that he is eating enough.  Patient is currently above usual weight.  Very thin appearing.  Height: Ht Readings from Last 1 Encounters:  10/20/12 5\' 7"  (1.702 m)   Weight: Wt Readings from Last 1 Encounters:  10/20/12 134 lb 12.8 oz (61.145 kg)   Ideal Body Weight: 67 kg  % Ideal Body Weight: 91  Wt Readings from Last 10 Encounters:  10/20/12 134 lb 12.8 oz (61.145 kg)  10/18/12 121 lb 7.6 oz (55.1 kg)   Usual Body Weight: 128# per pt. Wt Readings from Last 10 Encounters:  10/20/12 134 lb 12.8 oz (61.145 kg)  10/18/12 121 lb 7.6 oz (55.1 kg)   % Usual Body Weight: 105  BMI:  Body mass index is 21.11 kg/(m^2).  Estimated Nutritional Needs: Kcal: 1600-1800 Protein: 70-80 gm Fluid: 1.6L  Skin: few abrasions on scalp  Diet Order: General  EDUCATION NEEDS: -Education needs addressed-discussed need to continue to increase intake to maintain/increase weight   Intake/Output Summary (Last 24 hours) at 10/30/12 1433 Last data filed at 10/30/12 1300  Gross per 24 hour  Intake    840 ml  Output   1200 ml  Net   -360 ml    Labs:  No results found for this basename: NA:3,K:3,CL:3,CO2:3,BUN:3,CREATININE:3,CALCIUM:3,MG:3,PHOS:3,GLUCOSE:3 in the last 168 hours  CBG (last 3)   Basename 10/30/12 0800  GLUCAP 93    Scheduled Meds:   . amLODipine  5 mg Oral Daily  . atorvastatin  20 mg Oral q1800  . hydrocerin   Topical BID  . metoprolol tartrate  12.5 mg Oral BID  . pantoprazole  40 mg Oral QHS  . potassium chloride  10 mEq Oral BID  .  senna-docusate  1 tablet Oral BID  . sulfamethoxazole-trimethoprim  1 tablet Oral Q12H  . white petrolatum       Continuous Infusions:   Past Medical History  Diagnosis Date  . Hypertension   . Hyperlipidemia   . Myocardial infarction     Past Surgical History  Procedure Date  . Angioplasty       Oran Rein, RD, LDN Clinical Inpatient Dietitian Pager:  [phone] Weekend and after hours pager:  [phone]

## 2012-10-30 NOTE — Progress Notes (Addendum)
Physical Therapy Weekly Progress Note  Patient Details  Name: John Montgomery MRN: [id number] Date of Birth: Dec 17, 1920  Today's Date: 10/30/2012 Time: 0903-1003 Time Calculation (min): 60 min  Patient has met 1 of 5 short term goals.    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  Patient See STG week 2.  .  Continue plan of care.  PT Short Term Goals Week 1:  PT Short Term Goal 1 (Week 1): bed mobility with mod assist PT Short Term Goal 1 - Progress (Week 1): Progressing toward goal PT Short Term Goal 2 (Week 1): transfers bed to [facility] with mod assist to the left PT Short Term Goal 2 - Progress (Week 1): Progressing toward goal PT Short Term Goal 3 (Week 1): WC mobility mod assist to help steer PT Short Term Goal 3 - Progress (Week 1): Met PT Short Term Goal 4 (Week 1): gait 2 person assist >25' with LRAD PT Short Term Goal 4 - Progress (Week 1): Progressing toward goal PT Short Term Goal 5 (Week 1): Dynamic sitting balance min assist.   PT Short Term Goal 5 - Progress (Week 1): Progressing toward goal  Skilled Therapeutic Interventions/Progress Updates:    This session focused on WC mobility with min assist 150'.  New tennis shoes have helped tremendously with this task.  Still needs cues to attend to obstacles on his right side.  Sit to stand transfers at mirror repeated > 10 reps working on looking in the mirror and keeping weight shifted left and anteriorly (put nose over left knee to stand.  In standing worked on finding  midline posture using mirror in the parallel bars.  Gait training in the parallel bars x 1 length of the bars max assist to help control his right leg during stance and progress it forward during swing phase of gait.  Tried rocker AFO with little success.  Transfer training from [facility] to mat table and back to [facility] max assist with verbal cues for where to lean to get good weight shift, but during this activity especially his pushing kicks in hard and it is difficult for him to decrease the amount of force he uses in his left arm and leg during the transfer.  Stair training with one railing on the left max assist to support trunk and block right knee to go up and down one 4" step (x 2) with cues for leg sequencing.    Therapy Documentation Precautions:  Precautions Precautions: Fall Precaution Comments: Strong lateral lean to Rt, due to pushing Required Braces or Orthoses: Other Brace/Splint Other Brace/Splint: trialing some AFOs right foot Restrictions Weight Bearing Restrictions: No    Pain: Pain Assessment Pain Assessment: 0-10 Pain Score:   7 Pain Type: Acute pain Pain Location: Hip Pain Orientation: Right Pain Intervention(s): Repositioned (only hurt during transfers in /out of WC)  See FIM for current functional status  Therapy/Group: Individual Therapy  Lurena Joiner B. Ahana Najera, PT, DPT [phone]   10/30/2012, 12:33 PM

## 2012-10-30 NOTE — Progress Notes (Signed)
Patient ID: John Montgomery, male   DOB: Apr 16, 1921, 78 y.o.   MRN: [id number] Subjective/Complaints: No new issues. Attended stroke support group yesterday A 12 point review of systems has been performed and if not noted above is otherwise negative.  Objective: Vital Signs: Blood pressure 151/64, pulse 82, temperature 97.5 F (36.4 C), temperature source Oral, resp. rate 18, height 5\' 7"  (1.702 m), weight 61.145 kg (134 lb 12.8 oz), SpO2 96.00%. No results found. No results found for this basename: WBC:2,HGB:2,HCT:2,PLT:2 in the last 72 hours No results found for this basename: NA:2,K:2,CL:2,CO:2,GLUCOSE:2,BUN:2,CREATININE:2,CALCIUM:2 in the last 72 hours CBG (last 3)  No results found for this basename: GLUCAP:3 in the last 72 hours  Wt Readings from Last 3 Encounters:  10/20/12 61.145 kg (134 lb 12.8 oz)  10/18/12 55.1 kg (121 lb 7.6 oz)    Physical Exam:  Mid occiput area with abrasion and dried blood without change Eyes: Pupils are equal, round, and reactive to light.  Neck: Normal range of motion.  Cardiovascular: Normal rate and regular rhythm.  Pulmonary/Chest: Effort normal and breath sounds normal.  Abdominal: Soft. Bowel sounds are normal.  Musculoskeletal: He exhibits no edema.  Neurological: He is alert and oriented to person, place, and time.  . Pleasant and appropriate. Follows commands without difficulty. Soft speech without dysarthria.+ Word finding deficits Right sided weakness with intermittent flexor spasms of RLE. RUE is 0/5 deltoid,1+ biceps, 1 triceps, grip 3-4/5. RLE is 1 at RHF,2- RKE and 0/5 at ankle. Senses pain throughout right side.right central 7 and some tongue deviation, but other CN normal. DTR's are 2+ right elbow, knee. Toes non reactive. Cognitively he displays good insight and awareness. Id's simple objects without issue. Occasionally struggles with longer phrases and sentences.  Skin: Skin is warm and dry. A few abrasions on scalp. Psychiatric: He  has a normal mood and affect. His speech is normal. Thought content normal. His mood appears not anxious. Cognition and memory are normal.    Assessment/Plan: 1. 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 Physiatrist is providing close team supervision and 24 hour management of active medical problems listed below. Physiatrist and rehab team continue to assess barriers to discharge/monitor patient progress toward functional and medical goals.   Marland Kitchen  FIM: FIM - Bathing Bathing Steps Patient Completed: Chest;Right Arm;Abdomen;Right upper leg;Left upper leg Bathing: 3: Mod-Patient completes 5-7 49f 10 parts or 50-74%  FIM - Upper Body Dressing/Undressing Upper body dressing/undressing steps patient completed: Thread/unthread left sleeve of pullover shirt/dress Upper body dressing/undressing: 2: Max-Patient completed 25-49% of tasks FIM - Lower Body Dressing/Undressing Lower body dressing/undressing steps patient completed: Thread/unthread left pants leg;Don/Doff left shoe Lower body dressing/undressing: 2: Max-Patient completed 25-49% of tasks  FIM - Hotel manager Devices: Grab bar or rail for support Toileting: 1: Total-Patient completed zero steps, helper did all 3  FIM - Diplomatic Services operational officer Devices: Psychiatrist Transfers: 1-Two helpers  FIM - Banker Devices: Arm rests Bed/Chair Transfer: 1: Two helpers  FIM - Locomotion: Wheelchair Distance: 80 Locomotion: Wheelchair: 0: Activity did not occur FIM - Locomotion: Ambulation Locomotion: Ambulation Assistive Devices: Other (comment) (railing in hallway) Ambulation/Gait Assistance: 2: Max assist Locomotion: Ambulation: 0: Activity did not occur  Comprehension Comprehension Mode: Auditory Comprehension: 5-Understands  complex 90% of the time/Cues < 10% of the time  Expression Expression Mode: Verbal Expression: 5-Expresses  basic needs/ideas: With extra time/assistive device  Social Interaction Social Interaction: 6-Interacts appropriately with others with medication or extra time (anti-anxiety, antidepressant).  Problem Solving Problem Solving: 5-Solves basic 90% of the time/requires cueing < 10% of the time  Memory Memory: 5-Recognizes or recalls 90% of the time/requires cueing < 10% of the time  Medical Problem List and Plan:  1. DVT Prophylaxis/Anticoagulation: Mechanical: Sequential compression devices, below knee Bilateral lower extremities  2. Pain Management: N/A  3. Mood: appropriate. Will monitor for now.   - neuropsych input and treatment appreciated 4. Neuropsych: This patient is capable of making decisions on his/her own behalf.  5. PAF: resolved. Continue low dose BB with close monitoring of HR for bradycardia as with tendency towards SSS. Keep K+ levels around 4.0.  6. HTN: Monitor with bid checks. Norvasc resumed.  7. ABLA: hgb climbing  8. RLE spasm: low dose baclofen prn has been helpful  -added prafo to control ankle at night--tolerating well. 9. UTI- bactrim  LOS (Days) 10 A FACE TO FACE EVALUATION WAS PERFORMED  John Montgomery 10/30/2012 7:24 AM

## 2012-10-30 NOTE — Progress Notes (Signed)
Occupational Therapy Session Notes  Patient Details  Name: John Montgomery MRN: [id number] Date of Birth: 27-May-1921  Today's Date: 10/30/2012 Time: 1478-2956 and 150-230 Time Calculation (min): 40 min and 40 min  OT Short Term Goals Week 2:  OT Short Term Goal 1 (Week 2): Pt. will dress UB with min assist  OT Short Term Goal 2 (Week 2): Pt will dress LB with mod A OT Short Term Goal 3 (Week 2): Pt will perform toilet transfer to [facility] with mod A OT Short Term Goal 4 (Week 2): Pt will perform toileting with max A   Skilled Therapeutic Interventions/Progress Updates:  1)  Standing activity using mirror to help patient realize need to shift weight toward his strong right side using his RUE to assist when episodes of LOB.  Patient fatigues easily at which time he pushes harder to his weak side requiring as much as total assist.  After rest break, he performs standing balance with supervision-min initially while standing in front of mirror Right;Upper Extremity;Lower Extremity;Forced use;Activity to increase motor control;Activity to increase timing and sequencing;Activity to increase sustained activation;Activity to increase lateral weight shifting;Activity to increase posterior weight shifting to perform sit><stands with less assist and less pushing. Weight bearing through RLE during dynamic standing with VCs to shift weight to left and verbal as well as tactile cues to sustain knee extension in stand or squat position.   2)  Patient propelled w/c with LUE & LLE requiring VCs to scan to his right secondary to he bumped into walls, doors, and items in hallway.  Performed sit><stands and dynamic standing balance while performing task with LUE.  Patient unable to attend to task of sustaining RLE & RUE weight bearing and balance due to distractions in the room.  While patient is falling/pushing to the right or forward, he only made minimal attempts occasionally to recover which required mod-total assist  as compared to this morning session which occurred in a quiet environment.  Patient's daughter present to observe and provide occasional assist with standing balance.  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 RUE Weight Bearing Technique: Extended arm standing Response to Weight Bearing Technique: Able to bear weight through RUE during dynamic standing only if supported at elbow.  Patient also weight bearing through RLE during dynamic standing with VCs to shift weight to left and verbal as well as tactile cues to sustain knee extension in stand or squat position.  Therapy Documentation Precautions:  Precautions Precautions: Fall Precaution Comments: Strong lateral lean to Rt, due to pushing Required Braces or Orthoses: Other Brace/Splint Other Brace/Splint: trialing some AFOs right foot Restrictions Weight Bearing Restrictions: No Pain: Denies pain Other Treatments:  Neuromuscular Facilitation:  Standing activity using mirror to help patient realize need to shift weight toward his strong right side using his RUE to assist when episodes of LOB.  Patient fatigues easily at which time he pushes harder to his weak side requiring as much as total assist.  After rest break, he performs standing balance with supervision-min initially while standing in front of mirror Right;Upper Extremity;Lower Extremity;Forced use;Activity to increase motor control;Activity to increase timing and sequencing;Activity to increase sustained activation;Activity to increase lateral weight shifting;Activity to increase posterior weight shifting to perform sit><stands with less assist and less pushing. Weight Bearing Technique: Yes RUE Weight Bearing Technique: Extended arm standing Response to Weight Bearing Technique: Able to bear weight through RUE during dynamic standing only if supported at elbow.  Patient also weight  bearing through RLE during dynamic standing with VCs to shift  weight to left and verbal as well as tactile cues to sustain knee extension or squat position.  Therapy/Group: Individual Therapy  Jianni Shelden 10/30/2012, 3:49 PM

## 2012-10-31 ENCOUNTER — Inpatient Hospital Stay (HOSPITAL_COMMUNITY): Payer: Medicare Other | Admitting: Physical Therapy

## 2012-10-31 ENCOUNTER — Inpatient Hospital Stay (HOSPITAL_COMMUNITY): Payer: Medicare Other | Admitting: Speech Pathology

## 2012-10-31 ENCOUNTER — Inpatient Hospital Stay (HOSPITAL_COMMUNITY): Payer: Medicare Other | Admitting: Occupational Therapy

## 2012-10-31 NOTE — Progress Notes (Signed)
Patient ID: John Montgomery, male   DOB: 28-Sep-1921, 78 y.o.   MRN: [id number] Subjective/Complaints: Dry mouth. Spasms better. Slept fairly well A 12 point review of systems has been performed and if not noted above is otherwise negative.  Objective: Vital Signs: Blood pressure 156/53, pulse 83, temperature 98.3 F (36.8 C), temperature source Oral, resp. rate 17, height 5\' 7"  (1.702 m), weight 61.145 kg (134 lb 12.8 oz), SpO2 94.00%. No results found. No results found for this basename: WBC:2,HGB:2,HCT:2,PLT:2 in the last 72 hours No results found for this basename: NA:2,K:2,CL:2,CO:2,GLUCOSE:2,BUN:2,CREATININE:2,CALCIUM:2 in the last 72 hours CBG (last 3)   Basename 10/30/12 0800  GLUCAP 93    Wt Readings from Last 3 Encounters:  10/20/12 61.145 kg (134 lb 12.8 oz)  10/18/12 55.1 kg (121 lb 7.6 oz)    Physical Exam:  Mid occiput area with abrasion and dried blood without change Eyes: Pupils are equal, round, and reactive to light.  Neck: Normal range of motion.  Cardiovascular: Normal rate and regular rhythm.  Pulmonary/Chest: Effort normal and breath sounds normal.  Abdominal: Soft. Bowel sounds are normal.  Musculoskeletal: He exhibits no edema.  Neurological: He is alert and oriented to person, place, and time.  . Pleasant and appropriate. Follows commands without difficulty. Soft speech without dysarthria.+ Word finding deficits Right sided weakness with intermittent flexor spasms of RLE. RUE is 0/5 deltoid,1+ biceps, 1 triceps, grip 3-4/5. RLE is 1 at RHF,2- RKE and 0/5 at ankle. Senses pain throughout right side.right central 7 and some tongue deviation, but other CN normal. DTR's are 2+ to 3+ right elbow, knee, trace resting tone right LE. Cognitively he displays good insight and awareness. Id's simple objects without issue. Occasionally struggles with longer phrases and sentences.  Skin: Skin is warm and dry. A few abrasions on scalp. Psychiatric: He has a normal mood and  affect. His speech is normal. Thought content normal. His mood appears not anxious. Cognition and memory are normal.    Assessment/Plan: 1. 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 Physiatrist is providing close team supervision and 24 hour management of active medical problems listed below. Physiatrist and rehab team continue to assess barriers to discharge/monitor patient progress toward functional and medical goals.   Marland Kitchen  FIM: FIM - Bathing Bathing Steps Patient Completed: Chest;Right Arm;Abdomen;Right upper leg;Left upper leg;Left lower leg (including foot) Bathing: 3: Mod-Patient completes 5-7 68f 10 parts or 50-74%  FIM - Upper Body Dressing/Undressing Upper body dressing/undressing steps patient completed: Thread/unthread left sleeve of front closure shirt/dress Upper body dressing/undressing: 2: Max-Patient completed 25-49% of tasks FIM - Lower Body Dressing/Undressing Lower body dressing/undressing steps patient completed: Thread/unthread left pants leg Lower body dressing/undressing: 1: Total-Patient completed less than 25% of tasks  FIM - Hotel manager Devices: Grab bar or rail for support Toileting: 1: Total-Patient completed zero steps, helper did all 3  FIM - Diplomatic Services operational officer Devices: Psychiatrist Transfers: 1-Two helpers  FIM - Banker Devices: Arm rests;Orthosis Bed/Chair Transfer: 2: Bed > Chair or W/C: Max A (lift and lower assist);2: Chair or W/C > Bed: Max A (lift and lower assist)  FIM - Locomotion: Wheelchair Distance: 150 Locomotion: Wheelchair: 4: Travels 150 ft or more: maneuvers on rugs and over door sillls with minimal assistance (Pt.>75%) FIM - Locomotion: Ambulation Locomotion: Ambulation Assistive Devices: Parallel  bars;Orthosis Ambulation/Gait Assistance:  2: Max assist Locomotion: Ambulation: 1: Travels less than 50 ft with maximal assistance (Pt: 25 - 49%)  Comprehension Comprehension Mode: Auditory Comprehension: 5-Understands basic 90% of the time/requires cueing < 10% of the time  Expression Expression Mode: Verbal Expression: 7-Expresses complex ideas: With no assist  Social Interaction Social Interaction: 6-Interacts appropriately with others with medication or extra time (anti-anxiety, antidepressant).  Problem Solving Problem Solving: 5-Solves basic 90% of the time/requires cueing < 10% of the time  Memory Memory: 5-Recognizes or recalls 90% of the time/requires cueing < 10% of the time  Medical Problem List and Plan:  1. DVT Prophylaxis/Anticoagulation: Mechanical: Sequential compression devices, below knee Bilateral lower extremities  2. Pain Management: N/A  3. Mood: appropriate. Will monitor for now.   - neuropsych input and treatment appreciated 4. Neuropsych: This patient is capable of making decisions on his/her own behalf.  5. PAF: resolved. Continue low dose BB with close monitoring of HR for bradycardia as with tendency towards SSS. Keep K+ levels around 4.0. --recheck tomorrow 6. HTN: Monitor with bid checks. Norvasc resumed.  7. ABLA: hgb climbing - recheck 8. RLE spasm: low dose baclofen prn has been helpful  -added prafo to control ankle at night--tolerating well. 9. UTI- bactrim--complete today 10. Dry mouth: likley med effect. Check labs tomorrow  LOS (Days) 11 A FACE TO FACE EVALUATION WAS PERFORMED  Duquan Gillooly T 10/31/2012 6:58 AM

## 2012-10-31 NOTE — Progress Notes (Signed)
Occupational Therapy Session Note  Patient Details  Name: John Montgomery MRN: [id number] Date of Birth: [birth year]  Today's Date: 10/31/2012 Time: 1478-2956 Time Calculation (min): 59 min  Short Term Goals: Week 2:  OT Short Term Goal 1 (Week 2): Pt. will dress UB with min assist  OT Short Term Goal 2 (Week 2): Pt will dress LB with mod A OT Short Term Goal 3 (Week 2): Pt will perform toilet transfer to [facility] with mod A OT Short Term Goal 4 (Week 2): Pt will perform toileting with max A  Skilled Therapeutic Interventions/Progress Updates:    Worked on neuromuscular re-education to increase sitting balance,transitional movements sit to stand, and scooting.  Pt with decreased ability to modulate LLE control and tends to push to the right, especially with sit to stand and standing.  Pt demonstrates motor impersistence on the right side as well with standing and at times the LLE will pull into knee flexion.  He tends to attempt standing without adequate forward weightshift and needs cueing when flexing his trunk forward to maintain cervical extension instead of looking at the floor.  He is able to initiate some elbow extension in the left elbow to assist with balance but not on a consistent basis.   Therapy Documentation Precautions:  Precautions Precautions: Fall Precaution Comments: Strong lateral lean to Rt, due to pushing Required Braces or Orthoses: Other Brace/Splint Other Brace/Splint: trialing some AFOs right foot Restrictions Weight Bearing Restrictions: No  Pain: Pain Assessment Pain Assessment: No/denies pain ADL: See FIM for current functional status  Therapy/Group: Individual Therapy  Charman Blasco OTR/L 10/31/2012, 3:36 PM

## 2012-10-31 NOTE — Progress Notes (Signed)
Physical Therapy Session Note  Patient Details  Name: John Montgomery MRN: [id number] Date of Birth: Dec 03, 1920  Today's Date: 10/31/2012 Time:Session #1: 0900-1000 , Session #2: 6440-3474 Time Calculation (min): Session #1: 60 min , Session #2: 50 min  Short Term Goals: Week 2:  PT Short Term Goal 1 (Week 2): bed mobility with mod assist PT Short Term Goal 2 (Week 2): transfers bed to [facility] with mod assist to the left PT Short Term Goal 3 (Week 2): WC mobility supervision/cueing for safety and obstacles on the right.  PT Short Term Goal 4 (Week 2): gait 2 person assist >25' with LRAD  Skilled Therapeutic Interventions/Progress Updates:    Session #1: This session focused on WC mobility 150' min assist around tight obstacles in room and to align [facility] with mat table for transfer.  Transfers to and from [facility] to left and right to mat table with max assist working on hand placement, correct weight shifting for direction of movement and decreasing the intensity of the push/pull on his left side.  Sit to stand with hemi walker max assist also working on keeping weigh shifted left during transitions and making slower more controlled movement.    Session #2: This session focused on WC mobility from room to gym 150' with min assist around tight spaces.  Walking in parallel bars with mirror for visual input working on keeping chest up and unweighting right leg to take staps forward max assist.  PT blocking right knee in stance and total assist to progress right leg in swing phase of gait.  Kept WC close behind during gait.  Standing neruo re ed in parallel bars with mirror for visual input.  Worked on extremes of wt shifting far left and far right with chest high and therapist locking out right knee.  Stairs with one railing max assist MAX verbal cues for safety, sequencing and technique.  Transfers to and from [facility] to mat table both right and left transfers and working on weight shifting while trying to scoot left  max assist.    Therapy Documentation Precautions:  Precautions Precautions: Fall Precaution Comments: Strong lateral lean to Rt, due to pushing Required Braces or Orthoses: Other Brace/Splint Other Brace/Splint: trialing some AFOs right foot Restrictions Weight Bearing Restrictions: No   Pain: Pain Assessment Pain Assessment: No/denies pain   Locomotion : Ambulation Ambulation/Gait Assistance: 2: Max assist Wheelchair Mobility Distance: 150   See FIM for current functional status  Therapy/Group: Individual Therapy  Lurena Joiner B. Keyanna Sandefer, PT, DPT [phone]   10/31/2012, 3:49 PM

## 2012-10-31 NOTE — Progress Notes (Signed)
Speech Language Pathology Daily Session Note  Patient Details  Name: John Montgomery MRN: [id number] Date of Birth: Oct 08, 1921  Today's Date: 10/31/2012 Time: 1015-1100 Time Calculation (min): 45 min  Short Term Goals: Week 2: SLP Short Term Goal 1 (Week 2): Pt will demonstrate basic problem solving with functional tasks with Min verbal cues  SLP Short Term Goal 2 (Week 2): Pt will recall new, daily information with supervision verbal cues.  SLP Short Term Goal 3 (Week 2): Pt will demonstrate mildly complex thoughts with supervision verbal cues with extra time.   Skilled Therapeutic Interventions: Treatment focus on cognitive goals. Pt participated in mildly complex new learning task with focus on organization and problem solving. Pt required Min verbal and question cues for recall of rules of task and for functional problem solving.  Pt demonstrated emergent awareness of difficulty of task with mod I.    FIM:  Comprehension Comprehension Mode: Auditory Comprehension: 5-Understands basic 90% of the time/requires cueing < 10% of the time Expression Expression Mode: Verbal Expression: 6-Expresses complex ideas: With extra time/assistive device Social Interaction Social Interaction: 6-Interacts appropriately with others with medication or extra time (anti-anxiety, antidepressant). Problem Solving Problem Solving: 5-Solves basic 90% of the time/requires cueing < 10% of the time Memory Memory: 5-Recognizes or recalls 90% of the time/requires cueing < 10% of the time  Pain Pain Assessment Pain Assessment: No/denies pain  Therapy/Group: Individual Therapy  Erikah Thumm 10/31/2012, 1:12 PM

## 2012-10-31 NOTE — Progress Notes (Signed)
Occupational Therapy Session Note  Patient Details  Name: John Montgomery MRN: [id number] Date of Birth: 03-01-1921  Today's Date: 10/31/2012 Time: 0800-0850 Time Calculation (min): 50 min  Short Term Goals: Week 2:  OT Short Term Goal 1 (Week 2): Pt. will dress UB with min assist  OT Short Term Goal 2 (Week 2): Pt will dress LB with mod A OT Short Term Goal 3 (Week 2): Pt will perform toilet transfer to [facility] with mod A OT Short Term Goal 4 (Week 2): Pt will perform toileting with max A  Skilled Therapeutic Interventions/Progress Updates:    Bathing and dressing sit to stand at the sink.  Pt needing max facilitation to integrate the RUE for washing the left side.  Able to demonstrate some gross finger flexion and extension but minimal elbow or shoulder movement.  Mod assist overall for dynamic sitting balance when attempting to wash overall and dress.  When sitting unsupported pt with frequent pushing and LOB to the right.  Can self-correct with cueing but needs constant cues to recognize that he is falling.  Mod assist for static standing at the sink, with max assist when attempting to wash his peri area.  Needs continued practice with hemi dressing techniques.  Required max demonstrational cueing to remember to start with his right UE and LE first.    Therapy Documentation Precautions:  Precautions Precautions: Fall Precaution Comments: Strong lateral lean to Rt, due to pushing Required Braces or Orthoses: Other Brace/Splint Other Brace/Splint: trialing some AFOs right foot Restrictions Weight Bearing Restrictions: No  ADL: See FIM for current functional status  Therapy/Group: Individual Therapy  Kristion Holifield OTR/L 10/31/2012, 12:16 PM

## 2012-11-01 ENCOUNTER — Inpatient Hospital Stay (HOSPITAL_COMMUNITY): Payer: Medicare Other

## 2012-11-01 ENCOUNTER — Inpatient Hospital Stay (HOSPITAL_COMMUNITY): Payer: Medicare Other | Admitting: Physical Therapy

## 2012-11-01 ENCOUNTER — Inpatient Hospital Stay (HOSPITAL_COMMUNITY): Payer: Medicare Other | Admitting: Speech Pathology

## 2012-11-01 ENCOUNTER — Inpatient Hospital Stay (HOSPITAL_COMMUNITY): Payer: Medicare Other | Admitting: Occupational Therapy

## 2012-11-01 ENCOUNTER — Encounter (HOSPITAL_COMMUNITY): Payer: Medicare Other

## 2012-11-01 DIAGNOSIS — I6200 Nontraumatic subdural hemorrhage, unspecified: Secondary | ICD-10-CM

## 2012-11-01 LAB — BASIC METABOLIC PANEL
CO2: 23 mEq/L (ref 19–32)
Chloride: 98 mEq/L (ref 96–112)
GFR calc Af Amer: 53 mL/min — ABNORMAL LOW (ref >90)
Potassium: 4.3 mEq/L (ref 3.5–5.1)
Sodium: 132 mEq/L — ABNORMAL LOW (ref 135–145)

## 2012-11-01 LAB — CBC
MCV: 91 fL (ref 78.0–100.0)
Platelets: 175 10*3/uL (ref 150–400)
RBC: 3.54 MIL/uL — ABNORMAL LOW (ref 4.22–5.81)
RDW: 12.5 % (ref 11.5–15.5)
WBC: 6.6 10*3/uL (ref 4.0–10.5)

## 2012-11-01 NOTE — Progress Notes (Signed)
Patient ID: John Montgomery, male   DOB: 11/24/1920, 78 y.o.   MRN: [id number] Subjective/Complaints: Felt better last night. Slept reasonably well.  A 12 point review of systems has been performed and if not noted above is otherwise negative.  Objective: Vital Signs: Blood pressure 160/62, pulse 80, temperature 98.4 F (36.9 C), temperature source Oral, resp. rate 17, height 5\' 7"  (1.702 m), weight 61.145 kg (134 lb 12.8 oz), SpO2 93.00%. No results found.  Basename 11/01/12 0550  WBC 6.6  HGB 10.9*  HCT 32.2*  PLT 175   No results found for this basename: NA:2,K:2,CL:2,CO:2,GLUCOSE:2,BUN:2,CREATININE:2,CALCIUM:2 in the last 72 hours CBG (last 3)   Basename 10/30/12 0800  GLUCAP 93    Wt Readings from Last 3 Encounters:  10/20/12 61.145 kg (134 lb 12.8 oz)  10/18/12 55.1 kg (121 lb 7.6 oz)    Physical Exam:  Mid occiput area with abrasion and dried blood without change Eyes: Pupils are equal, round, and reactive to light.  Neck: Normal range of motion.  Cardiovascular: Normal rate and regular rhythm.  Pulmonary/Chest: Effort normal and breath sounds normal.  Abdominal: Soft. Bowel sounds are normal.  Musculoskeletal: He exhibits no edema.  Neurological: He is alert and oriented to person, place, and time.  . Pleasant and appropriate. Follows commands without difficulty. Soft speech without dysarthria.+ Word finding deficits Right sided weakness with intermittent flexor spasms of RLE. RUE is 0/5 deltoid,1+ biceps, 1 triceps, grip 3-4/5. RLE is 1 at RHF,2- RKE and trace to 0/5 at ankle. Almost seems to have apraxia with movement of the right LE. Senses pain throughout right side.right central 7 and some tongue deviation, but other CN normal. DTR's are 2+ to 3+ right elbow, knee, trace resting tone right LE. Cognitively he displays good insight and awareness. Id's simple objects without issue. Occasionally struggles with longer phrases and sentences.  Skin: Skin is warm and dry. A  few abrasions on scalp. Psychiatric: He has a normal mood and affect. His speech is normal. Thought content normal. His mood appears not anxious. Cognition and memory are normal.    Assessment/Plan: 1. 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 Physiatrist is providing close team supervision and 24 hour management of active medical problems listed below. Physiatrist and rehab team continue to assess barriers to discharge/monitor patient progress toward functional and medical goals.   Marland Kitchen  FIM: FIM - Bathing Bathing Steps Patient Completed: Chest;Left Arm;Abdomen;Right upper leg;Left upper leg Bathing: 3: Mod-Patient completes 5-7 67f 10 parts or 50-74%  FIM - Upper Body Dressing/Undressing Upper body dressing/undressing steps patient completed: Thread/unthread right sleeve of pullover shirt/dresss;Put head through opening of pull over shirt/dress Upper body dressing/undressing: 3: Mod-Patient completed 50-74% of tasks FIM - Lower Body Dressing/Undressing Lower body dressing/undressing steps patient completed: Thread/unthread left pants leg Lower body dressing/undressing: 1: Total-Patient completed less than 25% of tasks  FIM - Hotel manager Devices: Grab bar or rail for support Toileting: 1: Two helpers  FIM - Diplomatic Services operational officer Devices: Psychiatrist Transfers: 0-Activity did not occur  FIM - Banker Devices: Arm rests;Orthosis Bed/Chair Transfer: 2: Bed > Chair or W/C: Max A (lift and lower assist);2: Chair or W/C > Bed: Max A (lift and lower assist)  FIM - Locomotion: Wheelchair Distance: 150 Locomotion: Wheelchair: 4: Travels 150 ft or more: maneuvers on rugs and over door  sillls with minimal assistance (Pt.>75%) FIM - Locomotion: Ambulation Locomotion: Ambulation  Assistive Devices: Parallel bars;Orthosis Ambulation/Gait Assistance: 2: Max assist Locomotion: Ambulation: 1: Travels less than 50 ft with maximal assistance (Pt: 25 - 49%)  Comprehension Comprehension Mode: Auditory Comprehension: 5-Understands basic 90% of the time/requires cueing < 10% of the time  Expression Expression Mode: Verbal Expression: 5-Expresses basic needs/ideas: With no assist  Social Interaction Social Interaction: 6-Interacts appropriately with others with medication or extra time (anti-anxiety, antidepressant).  Problem Solving Problem Solving: 5-Solves basic 90% of the time/requires cueing < 10% of the time  Memory Memory: 5-Recognizes or recalls 90% of the time/requires cueing < 10% of the time  Medical Problem List and Plan:  1. DVT Prophylaxis/Anticoagulation: Mechanical: Sequential compression devices, below knee Bilateral lower extremities  2. Pain Management: N/A  3. Mood: appropriate. Will monitor for now.   - neuropsych input and treatment appreciated 4. Neuropsych: This patient is capable of making decisions on his/her own behalf.  5. PAF: resolved. Continue low dose BB with close monitoring of HR for bradycardia as with tendency towards SSS. Keep K+ levels around 4.0. --bmet today 6. HTN: Monitor with bid checks. Norvasc resumed.  7. ABLA: hgb climbing - labs ok today 8. RLE spasm: low dose baclofen prn has been helpful  -added prafo to control ankle at night--tolerating well. 9. UTI- bactrim--complete today 10. Dry mouth: likley med effect. Labs pending  LOS (Days) 12 A FACE TO FACE EVALUATION WAS PERFORMED  Alix Lahmann T 11/01/2012 7:19 AM

## 2012-11-01 NOTE — Patient Care Conference (Signed)
Inpatient RehabilitationTeam Conference Note Date: 10/31/2012   Time: 3:05 PM    Patient Name: John Montgomery      Medical Record Number: [id number]  Date of Birth: 06-12-1921 Sex: Male         Room/Bed: 4009/4009-01 Payor Info: Payor: MEDICARE  Plan: MEDICARE PART A AND B  Product Type: *No Product type*     Admitting Diagnosis: LT ICH   Admit Date/Time:  10/20/2012  2:14 PM Admission Comments: No comment available   Primary Diagnosis:  SDH (subdural hematoma) Principal Problem: SDH (subdural hematoma)  Patient Active Problem List   Diagnosis Date Noted  . PAF (paroxysmal atrial fibrillation) 10/19/2012  . First degree AV block 10/19/2012  . SDH (subdural hematoma) 10/19/2012  . HTN (hypertension) 10/19/2012  . CAD, RCA PTCA '98, RCA DES '06. Low risk Nuc 2011 10/19/2012  . Dyslipidemia 10/19/2012  . PVD, moderate RICA disease 10/19/2012  . Diastolic dysfunction, grade 1 with an EF of 50-55% 2011 10/19/2012    Expected Discharge Date: Expected Discharge Date:  (SNF)  Team Members Present: Physician leading conference: Dr. Faith Montgomery Social Worker Present: John Jupiter, LCSW Nurse Present: John End, RN PT Present: John Montgomery, PT OT Present: John Montgomery, OT;John Montgomery, OT SLP Present: John Montgomery, SLP Other (Discipline and Name): John Montgomery, PPS Coordinator and John Glazier, RN     Current Status/Progress Goal Weekly Team Focus  Medical   some depression, coping with deficits  see prior,   see prior, spasticity control   Bowel/Bladder   continent of bowel and bladder, LBM 10/28/12  remain continent of bowel/bladder      Swallow/Nutrition/ Hydration             ADL's   mod A/max A overall; decreased static/dynamic sitting balance; max A sit<>stand and transfers  min A overall   RUE function; sitting balance; standing balance; transfers    Mobility   max assist transfers both into and out of WC, max assist gait in parallel bars, min assist WC  mobility indoor flat surfaces, max assist dynamic standing balance.    mod assist gait goal, mod assist stair goal, all other goals min assist  transfers, working on midline, decrease pushing to the right, transfers, gait-trailing AFOs, WC mobility   Communication   Supervision  Mod I  word-finding strategies    Safety/Cognition/ Behavioral Observations  Min A  Supervision  problem solving, working memory   Pain   no complaints of pain  less than or equal to 3      Skin   pink scab to posterior scalp OTA  no new breakdown         *See Interdisciplinary Assessment and Plan and progress notes for long and short-term goals  Barriers to Discharge: right sided weakness,     Possible Resolutions to Barriers:  care giver ed, adaptive equipment training    Discharge Planning/Teaching Needs:  Plan changed to SNF      Team Discussion:  Making gains, however, very slowly.  Family and pt have agreed to change plan to SNF.  Team feels this is appropriate change.  Revisions to Treatment Plan:  None   Continued Need for Acute Rehabilitation Level of Care: The patient requires daily medical management by a physician with specialized training in physical medicine and rehabilitation for the following conditions: Daily direction of a multidisciplinary physical rehabilitation program to ensure safe treatment while eliciting the highest outcome that is of practical value to the patient.: Yes  Daily medical management of patient stability for increased activity during participation in an intensive rehabilitation regime.: Yes Daily analysis of laboratory values and/or radiology reports with any subsequent need for medication adjustment of medical intervention for : Post surgical problems;Neurological problems;Other  John Montgomery 11/01/2012, 12:44 PM

## 2012-11-01 NOTE — Progress Notes (Signed)
Occupational Therapy Session Note  Patient Details  Name: John Montgomery MRN: [id number] Date of Birth: 12/09/1920  Today's Date: 11/01/2012 Session 1 Time: 0700-0755 Time Calculation (min): 55 min  Short Term Goals: Week 2:  OT Short Term Goal 1 (Week 2): Pt. will dress UB with min assist  OT Short Term Goal 2 (Week 2): Pt will dress LB with mod A OT Short Term Goal 3 (Week 2): Pt will perform toilet transfer to [facility] with mod A OT Short Term Goal 4 (Week 2): Pt will perform toileting with max A  Skilled Therapeutic Interventions/Progress Updates:    Pt in bed upon arrival but agreeable to participating in bathing and dressing tasks sit<>stand at sink.  Pt requested use of urinal prior to getting OOB and required assistance with positioning.  Pt required min A for supine to sit with HOB elevated and using bed rails.  Pt continues to require verbal cues for controlling movements, slowing movements, and for correcting sitting balance to midline.  Pt continues to push through LUE when sitting unsupported.  Pt required max A for squat pivot transfer bed->w/c.  When performing sit<>stand pt requires max verbal cues to shift weight forward to initiate sit->stand.  In standing pt requires constant verbal cues to shift weight to left while therapist facilitates/supports right knee.  Pt requires min A with max verbal cues with static standing but to complete functional tasks with LUE pt requires max A and is unable to maintain standing balance at midline.  Focus on safety awareness, static/dynamic standing balance, compensatory techniques for bathing/dressing, and activity tolerance.  Therapy Documentation Precautions:  Precautions Precautions: Fall Precaution Comments: Strong lateral lean to Rt, due to pushing Required Braces or Orthoses: Other Brace/Splint Other Brace/Splint: trialing some AFOs right foot Restrictions Weight Bearing Restrictions: No Pain: Pain Assessment Pain Assessment:  No/denies pain  See FIM for current functional status  Therapy/Group: Individual Therapy  Session 2 Time: 1415-1445 Pt denies pain Individual therapy Pt engaged in standing activities at table with focus on maintaining static standing balance at midline without support of LUE.  Pt requires max verbal cues for weight shifts to maintain balance at midline.  Pt occasionally rotates trunk to right when standing.Pt transitioned to engaging LUE in functional tasks while standing.  Pt initially required max A but as activity progressed pt required less physical assistance but continued to required max verbal cues for weight shifts.  Discussed with patient the goal was for him to be able to stand without using LUE for support so he could engage in bathing and dressing tasks while standing.  [name] [facility] 11/01/2012, 7:56 AM

## 2012-11-01 NOTE — Progress Notes (Signed)
Physical Therapy Session Note  Patient Details  Name: John Montgomery MRN: [id number] Date of Birth: 12-Feb-1921  Today's Date: 11/01/2012 Time: 4098-1191 Time Calculation (min): 49 min  Second Treatment:  11:29-12:00 31 minutes  Third Treatment:  13:00-13:35 35 min  Short Term Goals: Week 2:  PT Short Term Goal 1 (Week 2): bed mobility with mod assist PT Short Term Goal 2 (Week 2): transfers bed to [facility] with mod assist to the left PT Short Term Goal 3 (Week 2): WC mobility supervision/cueing for safety and obstacles on the right.  PT Short Term Goal 4 (Week 2): gait 2 person assist >25' with LRAD  Skilled Therapeutic Interventions/Progress Updates:    Pt showed significant difference in ability to perform transfers from first to second session, ? Due to fatigue.  Pt with increased pushing and extensor tone in RLE during 3rd session.  Pt however, wanting to push a head and not take a rest break in the bed. Changed pt's w/c due to broken brake.  Therapy Documentation Precautions:  Precautions Precautions: Fall Precaution Comments: Strong lateral lean to Rt, due to pushing Required Braces or Orthoses: Other Brace/Splint Other Brace/Splint: trialing some AFOs right foot Restrictions Weight Bearing Restrictions: No Vital Signs: Therapy Vitals Pulse Rate: 80  BP: 160/62 mmHg Pain: Pain Assessment Pain Assessment: No/denies pain Mobility:  1st:  Squat pivot transfers w/c to mat with mod@, stand pivot transfers chair to w/c with mod@.  Sit to stand with mod@.  Neuromuscular re-ed for gait training with bedside table, with max@, pt able to activate RLE in extension for swing phase on LLE, but unable to maintain extension if statically standing.  Increased extensor tone during swing phase on RLE, requiring max facilitation for swing through, 30' and 15'. W/c propulsion on unit x 250', including carpet, using L extremities, occasionally running into objects on the R, but self  correcting.  3rd:  Transfer w/c to bed with max@ with pt almost sliding off the bed due to increased extensor tone. Exercises:  2nd:  RLE stretching and PROM-AAROM exercises, focusing on pt being able to activate LE flexor muscles.  When pt tries to activate a flexor pattern he activates extensors, with increased tone and difficulty then breaking the extensor pattern. Other Treatments:   2nd:  Neuromuscular Re-ed:  Sit to stands focusing on shifting weight to the left and maintaining activation of the RLE throughout the complete task.  Pt with difficulty obtaining midline and not pushing with LLE to the R. 3rd:  Therapeutic activity on Kinetron to address reciprocal movement of the RLE in and out of flexion and extension movement pattern without increased extensor tone. See FIM for current functional status  Therapy/Group: Individual Therapy  [name] 11/01/2012, 9:40 AM

## 2012-11-01 NOTE — Progress Notes (Signed)
Speech Language Pathology Daily Session Note  Patient Details  Name: John Montgomery MRN: [id number] Date of Birth: [birth year]  Today's Date: 11/01/2012 Time: 7829-5621 Time Calculation (min): 45 min  Short Term Goals: Week 2: SLP Short Term Goal 1 (Week 2): Pt will demonstrate basic problem solving with functional tasks with Min verbal cues  SLP Short Term Goal 2 (Week 2): Pt will recall new, daily information with supervision verbal cues.  SLP Short Term Goal 3 (Week 2): Pt will demonstrate mildly complex thoughts with supervision verbal cues with extra time.   Skilled Therapeutic Interventions: Treatment focus on cognitive and language goals. Pt demonstrated emergent awareness of decreased word-finding and thought organization and required Mod A verbal and question cues to utilize a slow rate to increase speech intelligibility and the strategy of description to increase word-finding. Pt recalled strategies to utilize for bathing and dressing with Mod I and required Min verbal and question cues for recall of strategies to increase balance. Pt also demonstrated anticipatory awareness and reported he is aware he will need assistance at home.    FIM:  Comprehension Comprehension Mode: Auditory Comprehension: 5-Follows basic conversation/direction: With extra time/assistive device Expression Expression Mode: Verbal Expression: 5-Expresses basic needs/ideas: With extra time/assistive device Social Interaction Social Interaction: 5-Interacts appropriately 90% of the time - Needs monitoring or encouragement for participation or interaction. Problem Solving Problem Solving: 5-Solves basic 90% of the time/requires cueing < 10% of the time Memory Memory: 5-Recognizes or recalls 90% of the time/requires cueing < 10% of the time  Pain Pain Assessment Pain Assessment: No/denies pain  Therapy/Group: Individual Therapy  John Montgomery 11/01/2012, 12:03 PM

## 2012-11-01 NOTE — Progress Notes (Signed)
Occupational Therapy Session Note  Patient Details  Name: John Montgomery MRN: [id number] Date of Birth: [birth year]  Today's Date: 11/01/2012 Time: 1000-1030 Time Calculation (min): 30 min  Short Term Goals: Week 2:  OT Short Term Goal 1 (Week 2): Pt. will dress UB with min assist  OT Short Term Goal 2 (Week 2): Pt will dress LB with mod A OT Short Term Goal 3 (Week 2): Pt will perform toilet transfer to [facility] with mod A OT Short Term Goal 4 (Week 2): Pt will perform toileting with max A  Skilled Therapeutic Interventions/Progress Updates:  Patient sleeping in w/c upon arrival and declined need to use the toilet.  Engaged in propelling w/c with LLE &LUE with focus on scanning to right to avoid all objects.  Patient able to scan and avoid all obstacles.  Also focused on RUE  management, forced use and weight bearing while seated and while standing, maintaining midline in standing, sustaining LLE activation while performing dynamic standing balance tasks.  Therapy Documentation Precautions:  Precautions Precautions: Fall Precaution Comments: Strong lateral lean to Rt, due to pushing Required Braces or Orthoses: Other Brace/Splint Other Brace/Splint: trialing some AFOs right foot Restrictions Weight Bearing Restrictions: No Pain: Pain Assessment Pain Assessment: No/denies pain  Therapy/Group: Individual Therapy  Mahkayla Preece 11/01/2012, 12:55 PM

## 2012-11-01 NOTE — Plan of Care (Signed)
Problem: RH BOWEL ELIMINATION Goal: RH STG MANAGE BOWEL WITH ASSISTANCE STG Manage Bowel with min Assistance.  Outcome: Adequate for Discharge Patients last BM 10/28/12.A Carolee Channell,LPN

## 2012-11-01 NOTE — Progress Notes (Signed)
Social Work Patient ID: John Montgomery, male   DOB: 19-Sep-1921, 78 y.o.   MRN: [id number]  Met with patient and family yesterday afternoon to review team conference and discuss d/c plans.  All are agreed that they would like to change d/c plan to SNF to maximize his time receiving daily therapies.  Team aware.  Bed search underway.  Luvia Orzechowski, LCSW

## 2012-11-02 ENCOUNTER — Inpatient Hospital Stay (HOSPITAL_COMMUNITY): Payer: Medicare Other | Admitting: Physical Therapy

## 2012-11-02 ENCOUNTER — Inpatient Hospital Stay (HOSPITAL_COMMUNITY): Payer: Medicare Other

## 2012-11-02 ENCOUNTER — Encounter (HOSPITAL_COMMUNITY): Payer: Medicare Other

## 2012-11-02 ENCOUNTER — Inpatient Hospital Stay (HOSPITAL_COMMUNITY): Payer: Medicare Other | Admitting: Speech Pathology

## 2012-11-02 NOTE — Progress Notes (Signed)
Physical Therapy Session Note  Patient Details  Name: John Montgomery MRN: [id number] Date of Birth: 08-31-1921  Today's Date: 11/02/2012 Time: 1005-1100 Time Calculation (min): 55 min  Short Term Goals: Week 2:  PT Short Term Goal 1 (Week 2): bed mobility with mod assist PT Short Term Goal 2 (Week 2): transfers bed to [facility] with mod assist to the left PT Short Term Goal 3 (Week 2): WC mobility supervision/cueing for safety and obstacles on the right.  PT Short Term Goal 4 (Week 2): gait 2 person assist >25' with LRAD  Skilled Therapeutic Interventions/Progress Updates:    This session focused on WC mobility on flat tiled surfaces supervision with cueing for right sided obstacles, hemi technique.  Cues for parts management.  Pt ran into two obstacles on his right.  Standing in parallel bars working on dynamic standing balance reaching for horseshoes on both left and right with left hand an putting them up on b-ball goal.  Max assist to maintain balance when reaching to the right in standing.  Cues for weight shift (left and anteriorly), cues for slowing down his speed of movement and stopping at each step in the task to "reset" his posture.  Also, during this task we worked on the sit to stand transition making it smooth and slow and weight shifting more anteriorly and to the left during transitions.  WC to mat table transfers repeated multiple times for reinforcement of correct movement patterns mod assist needed to help pt lift his bottom between the mat and the chair.  Level transfers to both the right and the left.  Encouraged pt to make smaller motions, lean forward, but not so far that he bends in half and after every scoot re-align his posture.  Sit to supine and supine to sit on the mat table with no handrails min assist of right leg to get to supine, mod assist of trunk for balance  to get to sitting without a railing for support.  Pt continues to report some right hip pain during sessions when  trying to practice scoot pivot transfers only, not gait.    Therapy Documentation Precautions:  Precautions Precautions: Fall Precaution Comments: Strong lateral lean to Rt, due to pushing Required Braces or Orthoses: Other Brace/Splint Other Brace/Splint: trialing some AFOs right foot Restrictions Weight Bearing Restrictions: No General:   Vital Signs: Therapy Vitals BP: 160/78 mmHg Pain: Pain Assessment Pain Assessment: No/denies pain Pain Score: 0-No pain Mobility:   Locomotion : Ambulation Ambulation/Gait Assistance: 2: Max Lawyer Distance: 150   See FIM for current functional status  Therapy/Group: Individual Therapy  Lurena Joiner B. Anjela Cassara, PT, DPT [phone]   11/02/2012, 11:50 AM

## 2012-11-02 NOTE — Progress Notes (Signed)
Physical Therapy Session Note  Patient Details  Name: John Montgomery MRN: [id number] Date of Birth: September 27, 1921  Today's Date: 11/02/2012 Time: 0830-0930 Time Calculation (min): 60 min  Short Term Goals: Week 1:  PT Short Term Goal 1 (Week 1): bed mobility with mod assist PT Short Term Goal 1 - Progress (Week 1): Progressing toward goal PT Short Term Goal 2 (Week 1): transfers bed to [facility] with mod assist to the left PT Short Term Goal 2 - Progress (Week 1): Progressing toward goal PT Short Term Goal 3 (Week 1): WC mobility mod assist to help steer PT Short Term Goal 3 - Progress (Week 1): Met PT Short Term Goal 4 (Week 1): gait 2 person assist >25' with LRAD PT Short Term Goal 4 - Progress (Week 1): Progressing toward goal PT Short Term Goal 5 (Week 1): Dynamic sitting balance min assist.   PT Short Term Goal 5 - Progress (Week 1): Progressing toward goal  Skilled Therapeutic Interventions/Progress Updates: Patient performed WC propulsion in controlled environment >150' x2 for increased endurance with min A to assist with clearance on right due to inability to maintain straight path propelling with left UE/LE.  Squat pivot transfer to/from WC/mat table with mod A.  Sitting balance activities with lateral reaching and reaching across midline for improved sitting balance and trunk strengthenig.  Tossing activities in sitting to improve upright posture.  Sit/stand transfers to/from [facility] in parallel bars x4 with mod A.  Ambulation in parallel bars x2 bouts with mod A to block right knee and advance right UE/LE (bringing WC along behind patient).  Patient required cuing for upright posture, stride length on left, and appropriate weight shifting.  Patient propelled WC back to room with min A.  Session concluded with patient in room in [facility] with safety belt in place. Pain: Pain Assessment Pain Assessment: No/denies pain Pain Score: 0-No pain  See FIM for current functional status  Therapy/Group:  Individual Therapy  Rexene Agent 11/02/2012, 9:33 AM

## 2012-11-02 NOTE — Progress Notes (Signed)
Patient ID: John Montgomery, male   DOB: 1921/01/15, 78 y.o.   MRN: [id number] Subjective/Complaints: No new complaints today A 12 point review of systems has been performed and if not noted above is otherwise negative.  Objective: Vital Signs: Blood pressure 153/66, pulse 79, temperature 98.2 F (36.8 C), temperature source Oral, resp. rate 19, height 5\' 7"  (1.702 m), weight 61.145 kg (134 lb 12.8 oz), SpO2 97.00%. No results found.  Basename 11/01/12 0550  WBC 6.6  HGB 10.9*  HCT 32.2*  PLT 175    Basename 11/01/12 0550  NA 132*  K 4.3  CL 98  GLUCOSE 81  BUN 26*  CREATININE 1.33  CALCIUM 9.4   CBG (last 3)   Basename 10/30/12 0800  GLUCAP 93    Wt Readings from Last 3 Encounters:  10/20/12 61.145 kg (134 lb 12.8 oz)  10/18/12 55.1 kg (121 lb 7.6 oz)    Physical Exam:  Mid occiput area with abrasion and dried blood without change Eyes: Pupils are equal, round, and reactive to light.  Neck: Normal range of motion.  Cardiovascular: Normal rate and regular rhythm.  Pulmonary/Chest: Effort normal and breath sounds normal.  Abdominal: Soft. Bowel sounds are normal.  Musculoskeletal: He exhibits no edema.  Neurological: He is alert and oriented to person, place, and time.  . Pleasant and appropriate. Follows commands without difficulty. Soft speech without dysarthria.+ Word finding deficits Right sided weakness with intermittent flexor spasms of RLE. RUE is 0/5 deltoid,1+ biceps, 1 triceps, grip 3-4/5. RLE is 1 at RHF,2- RKE and trace to 0/5 at ankle. Almost seems to have apraxia with movement of the right LE. Senses pain throughout right side.right central 7 and some tongue deviation, but other CN normal. DTR's are 2+ to 3+ right elbow, knee, trace resting tone right LE. Cognitively he displays good insight and awareness. Id's simple objects without issue. Occasionally struggles with longer phrases and sentences.  Skin: Skin is warm and dry. A few abrasions on  scalp. Psychiatric: He has a normal mood and affect. His speech is normal. Thought content normal. His mood appears not anxious. Cognition and memory are normal.    Assessment/Plan: 1. 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 Physiatrist is providing close team supervision and 24 hour management of active medical problems listed below. Physiatrist and rehab team continue to assess barriers to discharge/monitor patient progress toward functional and medical goals.   Marland Kitchen  FIM: FIM - Bathing Bathing Steps Patient Completed: Chest;Left Arm;Abdomen;Right upper leg;Left upper leg Bathing: 3: Mod-Patient completes 5-7 25f 10 parts or 50-74%  FIM - Upper Body Dressing/Undressing Upper body dressing/undressing steps patient completed: Thread/unthread left sleeve of front closure shirt/dress;Button/unbutton shirt Upper body dressing/undressing: 3: Mod-Patient completed 50-74% of tasks FIM - Lower Body Dressing/Undressing Lower body dressing/undressing steps patient completed: Thread/unthread left pants leg;Don/Doff left sock;Don/Doff left shoe Lower body dressing/undressing: 2: Max-Patient completed 25-49% of tasks  FIM - Hotel manager Devices: Grab bar or rail for support Toileting: 1: Two helpers  FIM - Diplomatic Services operational officer Devices: Psychiatrist Transfers: 0-Activity did not occur  FIM - Banker Devices: Arm rests;Orthosis Bed/Chair Transfer: 3: Supine > Sit: Mod A (lifting assist/Pt. 50-74%/lift 2 legs;3: Sit > Supine: Mod A (lifting assist/Pt. 50-74%/lift 2 legs);3: Bed > Chair or W/C: Mod A (lift or lower assist);2: Chair or W/C >  Bed: Max A (lift and lower assist)  FIM - Locomotion: Wheelchair Distance: 250 Locomotion: Wheelchair: 5: Travels 150 ft or more: maneuvers on  rugs and over door sills with supervision, cueing or coaxing FIM - Locomotion: Ambulation Locomotion: Ambulation Assistive Devices: Other (comment) (bedside table) Ambulation/Gait Assistance: 2: Max assist Locomotion: Ambulation: 1: Travels less than 50 ft with total assistance/helper does all (Pt.<25%)  Comprehension Comprehension Mode: Auditory Comprehension: 5-Follows basic conversation/direction: With no assist  Expression Expression Mode: Verbal Expression: 5-Expresses basic needs/ideas: With extra time/assistive device  Social Interaction Social Interaction: 5-Interacts appropriately 90% of the time - Needs monitoring or encouragement for participation or interaction.  Problem Solving Problem Solving: 5-Solves complex 90% of the time/cues < 10% of the time  Memory Memory: 4-Recognizes or recalls 75 - 89% of the time/requires cueing 10 - 24% of the time  Medical Problem List and Plan:  1. DVT Prophylaxis/Anticoagulation: Mechanical: Sequential compression devices, below knee Bilateral lower extremities  2. Pain Management: N/A  3. Mood: appropriate. Will monitor for now.   - neuropsych input and treatment appreciated 4. Neuropsych: This patient is capable of making decisions on his/her own behalf.  5. PAF: resolved. Continue low dose BB with close monitoring of HR for bradycardia as with tendency towards SSS. Keep K+ levels around 4.0.---4.3 most recently 6. HTN: Monitor with bid checks. Norvasc resumed.  7. ABLA: hgb climbing - labs ok today 8. RLE spasm: low dose baclofen prn has been helpful  -added prafo to control ankle at night--tolerating well. 9. UTI- bactrim--complete today 10. Dry mouth: push fluids.  -recheck tomorroe  LOS (Days) 13 A FACE TO FACE EVALUATION WAS PERFORMED  Sahasra Belue T 11/02/2012 7:18 AM

## 2012-11-02 NOTE — Progress Notes (Signed)
Patient ID: [name], male   DOB: [birth year], 78 y.o.   MRN: [id number]  Neuropsychology Psychosocial Evaluation  Per request from the treatment team, I met with [name] today for a supportive therapy session to provide continued assessment of his mood and coping skills as he adjusts to his medical condition post-TBI.  Overall, John Montgomery continues to express worry about having others care for him, as he is used to being the caretaker.  He said that he continues to awaken in the middle of the night with worry about his condition and he still feels "frustration" regarding his slow physical recovery.  He also continues to worry about finances and he mentioned that he felt as though his current hospital stay was likely expensive and he was unsure how he would be able to afford it.  After we talked for a while (approximately 15 minutes), John Montgomery perked up and therefore, it continues to be the case that allowing him space to get his concerns off of his chest will likely improve mood and coping.  Additionally, interacting with him and talking about any topic will probably be useful in helping him to focus on something other than his concerns and allow him to engage more fully in his recovery.    John Montgomery's responses to a self-report measure of mood symptoms were not suggestive of the presence of clinically significant depression at this time.  As such, his diagnosis continues to be most consistent with adjustment disorder with depression and anxiety.    Greater than 50% of this visit was spent educating the patient about the possible diagnosis, prognosis, management plan, and in coordination of care.   John Cella, PsyD Clinical Neuropsychologist

## 2012-11-02 NOTE — Progress Notes (Signed)
Speech Language Pathology Daily Session Note  Patient Details  Name: BLAIN HUNSUCKER MRN: [id number] Date of Birth: 1921/06/23  Today's Date: 11/02/2012 Time: 5621-3086 Time Calculation (min): 55 min  Short Term Goals: Week 2: SLP Short Term Goal 1 (Week 2): Pt will demonstrate basic problem solving with functional tasks with Min verbal cues  SLP Short Term Goal 2 (Week 2): Pt will recall new, daily information with supervision verbal cues.  SLP Short Term Goal 3 (Week 2): Pt will demonstrate mildly complex thoughts with supervision verbal cues with extra time.   Skilled Therapeutic Interventions: Treatment focus on cognitive goals. Pt reported he is "feeling down" today but participated in therapy with difficulty. Pt participated in new learning task with focus on recall and utilization of memory compensatory strategy of association. Pt required supervision question cues for making an association between a category and symbol and required Min semantic cues to increase recall with use of strategy. Pt required Min verbal cues to decrease speech rate during functional conversation and self-monitor and corrected word-finding difficulty with Mod I.    FIM:  Comprehension Comprehension Mode: Auditory Comprehension: 5-Follows basic conversation/direction: With extra time/assistive device Expression Expression Mode: Verbal Expression: 5-Expresses basic needs/ideas: With extra time/assistive device Social Interaction Social Interaction: 5-Interacts appropriately 90% of the time - Needs monitoring or encouragement for participation or interaction. Problem Solving Problem Solving: 5-Solves complex 90% of the time/cues < 10% of the time Memory Memory: 4-Recognizes or recalls 75 - 89% of the time/requires cueing 10 - 24% of the time FIM - Eating Eating Activity: 5: Set-up assist for cut food  Pain Pain Assessment Pain Assessment: No/denies pain  Therapy/Group: Individual Therapy  Brantly Kalman,  Ozetta Flatley 11/02/2012, 3:04 PM

## 2012-11-02 NOTE — Progress Notes (Signed)
Occupational Therapy Session Note  Patient Details  Name: John Montgomery MRN: [id number] Date of Birth: 02-01-1921  Today's Date: 11/02/2012  Session 1 Time: 0700-0755 Time Calculation (min): 55 min  Short Term Goals: Week 2:  OT Short Term Goal 1 (Week 2): Pt. will dress UB with min assist  OT Short Term Goal 2 (Week 2): Pt will dress LB with mod A OT Short Term Goal 3 (Week 2): Pt will perform toilet transfer to [facility] with mod A OT Short Term Goal 4 (Week 2): Pt will perform toileting with max A  Skilled Therapeutic Interventions/Progress Updates:    Pt in bed upon arrival but eager to "get up" and get washed up and dressed.  Pt initiated moving legs off edge of bed but RLE went into extension and required assistance.  Pt initiated reaching across body to grab bed rail and begin pushing up to sit EOB.  Pt transferred to w/c with squat pivot transfer requiring max verbal cues to control movements.  Pt engaged in bathing and dressing tasks with sit<>stand at sink from w/c.  Focus on controlled movements when standing.  Pt demonstrating improved ability to maintain static standing with steady assist and occasional support at right knee.  Emphasis of therapy transitioned to maintaining balance when using LUE to engage in bathing task.  Pt requires max/mod A when LUE engaged in functional task.  Pt able to self correct with min A and max verbal cues to correct standing balance.  Pt continues to voice disappointment with slow progress.  Therapy Documentation Precautions:  Precautions Precautions: Fall Precaution Comments: Strong lateral lean to Rt, due to pushing Required Braces or Orthoses: Other Brace/Splint Other Brace/Splint: trialing some AFOs right foot Restrictions Weight Bearing Restrictions: No Pain: Pain Assessment Pain Assessment: No/denies pain Pain Score: 0-No pain  See FIM for current functional status  Therapy/Group: Individual Therapy  Session 2 Time: 1300-1330 Pt  denies pain Individual Therapy  Pt engaged in static and dynamic standing tasks at table.  Pt's daughter present to observe and pleased with progress.  Pt stood at table X 4 X 2 min.  Pt able to remain standing statically with close supervision and verbal cues to weight shift to left.  Pt transitioned to dynamic standing tasks requiring min A and verbal cues to weight shift.  Discussed with pt and daughter that goal was to be able to stand and use LUE to assist with bathing and dressing tasks.   [name] [facility] 11/02/2012, 7:57 AM

## 2012-11-03 ENCOUNTER — Inpatient Hospital Stay (HOSPITAL_COMMUNITY): Payer: Medicare Other

## 2012-11-03 ENCOUNTER — Inpatient Hospital Stay (HOSPITAL_COMMUNITY): Payer: Medicare Other | Admitting: Physical Therapy

## 2012-11-03 ENCOUNTER — Inpatient Hospital Stay (HOSPITAL_COMMUNITY): Payer: Medicare Other | Admitting: Speech Pathology

## 2012-11-03 DIAGNOSIS — S069X9A Unspecified intracranial injury with loss of consciousness of unspecified duration, initial encounter: Secondary | ICD-10-CM

## 2012-11-03 DIAGNOSIS — W19XXXA Unspecified fall, initial encounter: Secondary | ICD-10-CM

## 2012-11-03 LAB — BASIC METABOLIC PANEL
CO2: 26 mEq/L (ref 19–32)
Calcium: 9.2 mg/dL (ref 8.4–10.5)
Chloride: 100 mEq/L (ref 96–112)
Glucose, Bld: 89 mg/dL (ref 70–99)
Potassium: 4.1 mEq/L (ref 3.5–5.1)
Sodium: 135 mEq/L (ref 135–145)

## 2012-11-03 NOTE — Progress Notes (Signed)
Physical Therapy Note  Patient Details  Name: John Montgomery MRN: [id number] Date of Birth: Jun 04, 1921 Today's Date: 11/03/2012  8:00 - 9:00 60 minutes Individual session Patient denies pain.  Treatment focused on neuromuscular re education for right LE, transfers and ambulation. Patient in wheelchair eating breakfast upon entering room. Patient propelled wheelchair 150 feet using left extremities - requiring occasional verbal cueing to attend to right. Patient required assist with wheelchair parts/set-up for transfer. Patient transferred to and from wheelchair/mat with minimal steadying assist. Patient sit to supine with supervision and supine to sit with min assist for right LE. Patient worked on facilitation of isolated hip and knee flexion to assist with swing. Other exercises included hip abduction/adduction control and knee flexion/extension. Patient ambulated 30 feet using railing in hallway with max assist to weight shift to left and progress right LE. Patient tends to get extensor synergy when attempting to move right LE. Worked on side stepping to left using rail in hallway. Patient required max assist and constant cueing to perform task. Patient propelled wheelchair back to room following session.    [name] 11/03/2012, 11:45 AM

## 2012-11-03 NOTE — Progress Notes (Signed)
Occupational Therapy Session Note  Patient Details  Name: John Montgomery MRN: [id number] Date of Birth: December 10, 1920  Today's Date: 11/03/2012 Time: 0700-0755 Time Calculation (min): 55 min  Short Term Goals: Week 2:  OT Short Term Goal 1 (Week 2): Pt. will dress UB with min assist  OT Short Term Goal 2 (Week 2): Pt will dress LB with mod A OT Short Term Goal 3 (Week 2): Pt will perform toilet transfer to [facility] with mod A OT Short Term Goal 4 (Week 2): Pt will perform toileting with max A  Skilled Therapeutic Interventions/Progress Updates:    Pt in bed upon arrival and began sitting up in bed upon arrival.  Pt still had SCDs on and PRAFO on RLE.  Pt stated he forgot these were still on.  Pt initiated moving legs off EOB to begin pushing up from supine into sitting EOB - required min A and verbal cues to control movements.  Pt required constant verbal cues during squat pivot transfer to w/c.  During transitional movements pt continues to exhibit extensor tone and requires constant cueing to shift weight forward.  Pt engaged in bathing and dressing tasks with sit<>stand at sink with focus on static/dynmaic standing balance as well as dynamic sitting balance.  Pt requires constant cueing while standing to maintain erect posture and weight shifted to left.  Pt requires min A/steady A for static standing balance and mod/max A with dynamic standing balance when engaging in bathing perineal area or pulling up pants.   Therapy Documentation Precautions:  Precautions Precautions: Fall Precaution Comments: Strong lateral lean to Rt, due to pushing Required Braces or Orthoses: Other Brace/Splint Other Brace/Splint: trialing some AFOs right foot Restrictions Weight Bearing Restrictions: No Pain: Pain Assessment Pain Assessment: No/denies pain  See FIM for current functional status  Therapy/Group: Individual Therapy  Rich Brave 11/03/2012, 7:58 AM

## 2012-11-03 NOTE — Progress Notes (Signed)
Speech Language Pathology Daily Session Note  Patient Details  Name: John Montgomery MRN: [id number] Date of Birth: 11-30-1920  Today's Date: 11/03/2012 Time: 1000-1045 Time Calculation (min): 45 min  Short Term Goals: Week 2: SLP Short Term Goal 1 (Week 2): Pt will demonstrate basic problem solving with functional tasks with Min verbal cues  SLP Short Term Goal 2 (Week 2): Pt will recall new, daily information with supervision verbal cues.  SLP Short Term Goal 3 (Week 2): Pt will demonstrate mildly complex thoughts with supervision verbal cues with extra time.   Skilled Therapeutic Interventions: Treatment focus on speech and cognitive goals. Pt participated in task of description with abstract pictures with focus on word-finding and speech intelligibility. Pt required supervision verbal cues for a slow rate with increased vocal intensity and Min verbal and question cues to self-monitor and correct word-finding errors. Pt sustained attention to task for ~30 minutes with supervision verbal cues for redirection.    FIM:  Comprehension Comprehension Mode: Auditory Comprehension: 5-Follows basic conversation/direction: With extra time/assistive device Expression Expression: 5-Expresses basic 90% of the time/requires cueing < 10% of the time. Social Interaction Social Interaction: 6-Interacts appropriately with others with medication or extra time (anti-anxiety, antidepressant). Problem Solving Problem Solving: 4-Solves basic 75 - 89% of the time/requires cueing 10 - 24% of the time Memory Memory: 4-Recognizes or recalls 75 - 89% of the time/requires cueing 10 - 24% of the time  Pain Pain Assessment Pain Assessment: No/denies pain  Therapy/Group: Individual Therapy  Aniston Christman 11/03/2012, 3:59 PM

## 2012-11-03 NOTE — Progress Notes (Signed)
NUTRITION FOLLOW UP  Intervention:   Continue current diet and encourage po intake.  Add supplements if needed to maintain weight.  Nutrition Dx:   Predicted inadequate oral intake related to decreased appetite AEB patient report and 50-75% intake of meals.  Goal:   Intake of >90% meals.  Monitor:   Intake, labs, weight trend.  Assessment:   Pt s/p ICH now awaiting NHP.  Saw patient during lunch today.  Reports that appetite has decreased but trying to eat well.  Intake is 50-75% meals.  Complained of a dry mouth to MD.  Encouraged fluid intake.  Patient does not want supplements at this time.  There is no new weight.  Height: Ht Readings from Last 1 Encounters:  10/20/12 5\' 7"  (1.702 m)    Weight Status:   Wt Readings from Last 1 Encounters:  10/20/12 134 lb 12.8 oz (61.145 kg)    Re-estimated needs:  Kcal: 1600-1800 Protein: 70-80 gm Fluid: 1.6L  Skin:  Warm and dry, a few abrasions on scalp  Diet Order: General   Intake/Output Summary (Last 24 hours) at 11/03/12 1338 Last data filed at 11/03/12 1300  Gross per 24 hour  Intake    600 ml  Output   1000 ml  Net   -400 ml     Labs:   Lab 11/03/12 0600 11/01/12 0550  NA 135 132*  K 4.1 4.3  CL 100 98  CO2 26 23  BUN 19 26*  CREATININE 0.99 1.33  CALCIUM 9.2 9.4  MG -- --  PHOS -- --  GLUCOSE 89 81    CBG (last 3)  No results found for this basename: GLUCAP:3 in the last 72 hours  Scheduled Meds:   . amLODipine  5 mg Oral Daily  . atorvastatin  20 mg Oral q1800  . hydrocerin   Topical BID  . metoprolol tartrate  12.5 mg Oral BID  . pantoprazole  40 mg Oral QHS  . potassium chloride  10 mEq Oral BID  . senna-docusate  1 tablet Oral BID    Continuous Infusions:   Oran Rein, RD, LDN Clinical Inpatient Dietitian Pager:  [phone] Weekend and after hours pager:  [phone]

## 2012-11-03 NOTE — Progress Notes (Signed)
Physical Therapy Note  Patient Details  Name: John Montgomery MRN: [id number] Date of Birth: 1920/12/25 Today's Date: 11/03/2012  2:00 - 3:00 60 minutes Individual session Patient denies pain.  Treatment focused on Nustep for facilitation of reciprocal movement of right LE. Patient exercised for 8 minutes on work load 3 and 2 minutes on workload 7. Patient able to grip with right UE. Facilitation for control with right extremities. Remainder of session focused on sit to stand with walker focusing on midline posture using mirror for feedback. Also worked on stepping forward and back with left LE with the RW. Patient able to maintain knee extension during stance on right LE. Attempted steps with right LE - patient tends to extend right LE and push to right making progression of right LE difficult even with toe up brace. Patient max assist to progress right LE.    [name] 11/03/2012, 4:23 PM

## 2012-11-03 NOTE — Progress Notes (Signed)
Patient ID: John Montgomery, male   DOB: 1921/11/02, 78 y.o.   MRN: [id number] Subjective/Complaints: No new complaints today A 12 point review of systems has been performed and if not noted above is otherwise negative.  Objective: Vital Signs: Blood pressure 164/62, pulse 70, temperature 97.9 F (36.6 C), temperature source Oral, resp. rate 18, height 5\' 7"  (1.702 m), weight 61.145 kg (134 lb 12.8 oz), SpO2 99.00%. No results found.  Basename 11/01/12 0550  WBC 6.6  HGB 10.9*  HCT 32.2*  PLT 175    Basename 11/01/12 0550  NA 132*  K 4.3  CL 98  GLUCOSE 81  BUN 26*  CREATININE 1.33  CALCIUM 9.4   CBG (last 3)  No results found for this basename: GLUCAP:3 in the last 72 hours  Wt Readings from Last 3 Encounters:  10/20/12 61.145 kg (134 lb 12.8 oz)  10/18/12 55.1 kg (121 lb 7.6 oz)    Physical Exam:  Mid occiput area with abrasion and dried blood without change Eyes: Pupils are equal, round, and reactive to light.  Neck: Normal range of motion.  Cardiovascular: Normal rate and regular rhythm.  Pulmonary/Chest: Effort normal and breath sounds normal.  Abdominal: Soft. Bowel sounds are normal.  Musculoskeletal: He exhibits no edema.  Neurological: He is alert and oriented to person, place, and time.  . Pleasant and appropriate. Follows commands without difficulty. Soft speech without dysarthria.+ Word finding deficits Right sided weakness with intermittent flexor spasms of RLE. RUE is 1/5 deltoid,1+to 2 biceps, 1 triceps, grip 3-4/5. RLE is 1 at RHF,2- RKE and trace to 0/5 at ankle. Almost seems to have apraxia with movement of the right LE. Senses pain throughout right side.right central 7 and some tongue deviation, but other CN normal. DTR's are 2+ to 3+ right elbow, knee, trace resting tone right LE. Cognitively he displays good insight and awareness. Id's simple objects without issue. Occasionally struggles with longer phrases and sentences.  Skin: Skin is warm and dry. A  few abrasions on scalp. Psychiatric: He has a normal mood and affect. His speech is normal. Thought content normal. His mood appears not anxious. Cognition and memory are normal.    Assessment/Plan: 1. 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 Physiatrist is providing close team supervision and 24 hour management of active medical problems listed below. Physiatrist and rehab team continue to assess barriers to discharge/monitor patient progress toward functional and medical goals.   Marland Kitchen  FIM: FIM - Bathing Bathing Steps Patient Completed: Chest;Right Arm;Abdomen;Front perineal area;Right upper leg;Left upper leg Bathing: 3: Mod-Patient completes 5-7 4f 10 parts or 50-74%  FIM - Upper Body Dressing/Undressing Upper body dressing/undressing steps patient completed: Thread/unthread left sleeve of front closure shirt/dress;Button/unbutton shirt Upper body dressing/undressing: 3: Mod-Patient completed 50-74% of tasks FIM - Lower Body Dressing/Undressing Lower body dressing/undressing steps patient completed: Thread/unthread left pants leg;Don/Doff left shoe Lower body dressing/undressing: 2: Max-Patient completed 25-49% of tasks  FIM - Hotel manager Devices: Grab bar or rail for support Toileting: 1: Two helpers  FIM - Diplomatic Services operational officer Devices: Psychiatrist Transfers: 0-Activity did not occur  FIM - Banker Devices: Orthosis;Arm rests Bed/Chair Transfer: 3: Supine > Sit: Mod A (lifting assist/Pt. 50-74%/lift 2 legs;4: Sit > Supine: Min A (steadying pt. > 75%/lift 1 leg);3: Bed > Chair or W/C: Mod A (lift or lower  assist);3: Chair or W/C > Bed: Mod A (lift or lower assist)  FIM - Locomotion: Wheelchair Distance: 150 Locomotion: Wheelchair: 5: Travels 150 ft or more:  maneuvers on rugs and over door sills with supervision, cueing or coaxing FIM - Locomotion: Ambulation Locomotion: Ambulation Assistive Devices: Parallel bars Ambulation/Gait Assistance: 2: Max assist Locomotion: Ambulation: 0: Activity did not occur  Comprehension Comprehension Mode: Auditory Comprehension: 5-Follows basic conversation/direction: With extra time/assistive device  Expression Expression Mode: Verbal Expression: 5-Expresses basic needs/ideas: With extra time/assistive device  Social Interaction Social Interaction: 5-Interacts appropriately 90% of the time - Needs monitoring or encouragement for participation or interaction.  Problem Solving Problem Solving: 5-Solves complex 90% of the time/cues < 10% of the time  Memory Memory: 4-Recognizes or recalls 75 - 89% of the time/requires cueing 10 - 24% of the time  Medical Problem List and Plan:  1. DVT Prophylaxis/Anticoagulation: Mechanical: Sequential compression devices, below knee Bilateral lower extremities  2. Pain Management: N/A  3. Mood: appropriate. Will monitor for now.   - neuropsych input and treatment appreciated 4. Neuropsych: This patient is capable of making decisions on his/her own behalf.  5. PAF: resolved. Continue low dose BB with close monitoring of HR for bradycardia as with tendency towards SSS. Keep K+ levels around 4.0.---4.3 most recently 6. HTN: Monitor with bid checks. Norvasc resumed.  7. ABLA: hgb remains around 11 8. RLE spasm: low dose baclofen prn has been helpful  -added prafo to control ankle at night--tolerating well. 9. UTI- bactrim--complete today 10. Dry mouth: push fluids.  -bmet  LOS (Days) 14 A FACE TO FACE EVALUATION WAS PERFORMED  SWARTZ,ZACHARY T 11/03/2012 7:10 AM

## 2012-11-03 NOTE — Plan of Care (Signed)
Problem: RH BLADDER ELIMINATION Goal: RH STG MANAGE BLADDER WITH ASSISTANCE STG Manage Bladder With min Assistance  Outcome: Not Progressing 11/03/12 Patient incontinent of urine at HS. Condom  Catheter used at HS. A. Esco Joslyn,LPN

## 2012-11-03 NOTE — Progress Notes (Signed)
Physical Therapy Session Note  Patient Details  Name: John Montgomery MRN: [id number] Date of Birth: December 26, 1920  Today's Date: 11/03/2012 Time: 4098-1191 Time Calculation (min): 45 min  Short Term Goals: Week 1:  PT Short Term Goal 1 (Week 1): bed mobility with mod assist PT Short Term Goal 1 - Progress (Week 1): Progressing toward goal PT Short Term Goal 2 (Week 1): transfers bed to [facility] with mod assist to the left PT Short Term Goal 2 - Progress (Week 1): Progressing toward goal PT Short Term Goal 3 (Week 1): WC mobility mod assist to help steer PT Short Term Goal 3 - Progress (Week 1): Met PT Short Term Goal 4 (Week 1): gait 2 person assist >25' with LRAD PT Short Term Goal 4 - Progress (Week 1): Progressing toward goal PT Short Term Goal 5 (Week 1): Dynamic sitting balance min assist.   PT Short Term Goal 5 - Progress (Week 1): Progressing toward goal  Skilled Therapeutic Interventions/Progress Updates: Patient performed WC propulsion in controlled environment >150' x2 for increased endurance with min A for clearance on right due to inability to maintain straight path propelling with left UE/LE. Worked with patient on squat pivot transfer to/from [employer] table with mod A to left and right. Sitting balance activities with lateral reaching, reaching across midline, and tossing to improve sitting balance, trunk strength and upright posture with mirror in place for visual feedback.  Patient demonstrated improved awareness of sitting balance with mirror.   Sit/stand transfers to/from [facility] in parallel bars x6 with mod A. Patient worked on lateral weight shifting and anterior/posterior weight shifting with mod A required to stabilize right knee.  Ambulation in parallel bars 6' with mod A to block right knee and advance right UE/LE (bringing WC along behind patient). Patient required cuing for upright posture, stride length on left, and appropriate weight shifting. Patient propelled WC back to room  with min A. Session concluded with patient in room in [facility] with safety belt in place.      Therapy Documentation Precautions:  Precautions Precautions: Fall Precaution Comments: Strong lateral lean to Rt, due to pushing Required Braces or Orthoses: Other Brace/Splint Other Brace/Splint: trialing some AFOs right foot Restrictions Weight Bearing Restrictions: No   Pain: Pain Assessment Pain Assessment: No/denies pain   Locomotion : Ambulation Ambulation/Gait Assistance: 2: Max assist   See FIM for current functional status  Therapy/Group: Individual Therapy  Rexene Agent 11/03/2012, 12:24 PM

## 2012-11-04 ENCOUNTER — Inpatient Hospital Stay (HOSPITAL_COMMUNITY): Payer: Medicare Other

## 2012-11-04 NOTE — Progress Notes (Signed)
Speech Language Pathology Daily Session Note  Patient Details  Name: John Montgomery MRN: [id number] Date of Birth: Jun 08, 1921  Today's Date: 11/04/2012 Time: 4098-1191 Time Calculation (min): 55 min  Short Term Goals: Week 2: SLP Short Term Goal 1 (Week 2): Pt will demonstrate basic problem solving with functional tasks with Min verbal cues  SLP Short Term Goal 2 (Week 2): Pt will recall new, daily information with supervision verbal cues.  SLP Short Term Goal 3 (Week 2): Pt will demonstrate mildly complex thoughts with supervision verbal cues with extra time.   Skilled Therapeutic Interventions: Patient participated in therapeutic intervention for cognitive re-training, so that he may return to his prior level of independence.  Therapy focused on problem solving and safety.  Patient needed verbal cues to achieve 90% accuracy with problem solving activities.  He was 85% accurate with verbal cues when working on safety situations/activities.  Continue with current treatment plan.       FIM:  Comprehension Comprehension Mode: Auditory Comprehension: 5-Follows basic conversation/direction: With extra time/assistive device Expression Expression Mode: Verbal Expression: 5-Expresses basic 90% of the time/requires cueing < 10% of the time. Social Interaction Social Interaction: 6-Interacts appropriately with others with medication or extra time (anti-anxiety, antidepressant). Problem Solving Problem Solving: 4-Solves basic 75 - 89% of the time/requires cueing 10 - 24% of the time Memory Memory: 4-Recognizes or recalls 75 - 89% of the time/requires cueing 10 - 24% of the time  Pain Pain Assessment Pain Assessment: No/denies pain  Individual Therapy  Lenny Pastel 11/04/2012, 3:09 PM

## 2012-11-04 NOTE — Progress Notes (Signed)
Patient ID: John Montgomery, male   DOB: 15-Jun-1921, 78 y.o.   MRN: [id number]  Patient ID: John Montgomery, male   DOB: [birth year], 78 y.o.   MRN: [id number] Subjective/Complaints:  12/21.  No new complaints today. 78 year old patient status post left frontal SDH. A 12 point review of systems has been performed and if not noted above is otherwise negative.  HEENT exam negative. Chest clear cardiovascular normal S1-S2 rhythm is regular. Abdomen soft nontender no distention. Extremities no edema. SCDs in place. Right hemiparesis. GU-condom cath noted  BP Readings from Last 3 Encounters:  11/04/12 168/66  10/20/12 166/76    Objective: Vital Signs: Blood pressure 168/66, pulse 84, temperature 97.7 F (36.5 C), temperature source Oral, resp. rate 19, height 5\' 7"  (1.702 m), weight 134 lb 12.8 oz (61.145 kg), SpO2 97.00%. No results found. No results found for this basename: WBC:2,HGB:2,HCT:2,PLT:2 in the last 72 hours  Basename 11/03/12 0600  NA 135  K 4.1  CL 100  GLUCOSE 89  BUN 19  CREATININE 0.99  CALCIUM 9.2   CBG (last 3)  No results found for this basename: GLUCAP:3 in the last 72 hours  Wt Readings from Last 3 Encounters:  10/20/12 134 lb 12.8 oz (61.145 kg)  10/18/12 121 lb 7.6 oz (55.1 kg)    Physical Exam:  Mid occiput area with abrasion and dried blood without change Eyes: Pupils are equal, round, and reactive to light.  Neck: Normal range of motion.  Cardiovascular: Normal rate and regular rhythm.  Pulmonary/Chest: Effort normal and breath sounds normal.  Abdominal: Soft. Bowel sounds are normal.  Musculoskeletal: He exhibits no edema.  Neurological: He is alert and oriented to person, place, and time.  . Pleasant and appropriate. Follows commands without difficulty. Soft speech without dysarthria.+ Word finding deficits Right sided weakness with intermittent flexor spasms of RLE. RUE is 1/5 deltoid,1+to 2 biceps, 1 triceps, grip 3-4/5. RLE is 1 at RHF,2- RKE and  trace to 0/5 at ankle. Almost seems to have apraxia with movement of the right LE. Senses pain throughout right side.right central 7 and some tongue deviation, but other CN normal. DTR's are 2+ to 3+ right elbow, knee, trace resting tone right LE. Cognitively he displays good insight and awareness. Id's simple objects without issue. Occasionally struggles with longer phrases and sentences.  Skin: Skin is warm and dry. A few abrasions on scalp. Psychiatric: He has a normal mood and affect. His speech is normal. Thought content normal. His mood appears not anxious. Cognition and memory are normal.    Assessment/Plan: 1. 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 Physiatrist is providing close team supervision and 24 hour management of active medical problems listed below. Physiatrist and rehab team continue to assess barriers to discharge/monitor patient progress toward functional and medical goals.   Marland Kitchen  FIM: FIM - Bathing Bathing Steps Patient Completed: Chest;Right Arm;Abdomen;Right upper leg;Left upper leg Bathing: 3: Mod-Patient completes 5-7 63f 10 parts or 50-74%  FIM - Upper Body Dressing/Undressing Upper body dressing/undressing steps patient completed: Thread/unthread left sleeve of front closure shirt/dress;Button/unbutton shirt Upper body dressing/undressing: 3: Mod-Patient completed 50-74% of tasks FIM - Lower Body Dressing/Undressing Lower body dressing/undressing steps patient completed: Thread/unthread left pants leg;Don/Doff left sock;Don/Doff left shoe Lower body dressing/undressing: 2: Max-Patient completed 25-49% of tasks  FIM - Hotel manager Devices: Grab bar or rail for support Toileting: 1:  Two helpers  FIM - Diplomatic Services operational officer Devices: Psychiatrist Transfers: 0-Activity did not occur  FIM -  Banker Devices: Arm rests Bed/Chair Transfer: 4: Supine > Sit: Min A (steadying Pt. > 75%/lift 1 leg);4: Bed > Chair or W/C: Min A (steadying Pt. > 75%)  FIM - Locomotion: Wheelchair Distance: 150 Locomotion: Wheelchair: 5: Travels 150 ft or more: maneuvers on rugs and over door sills with supervision, cueing or coaxing FIM - Locomotion: Ambulation Locomotion: Ambulation Assistive Devices: Other (comment) (railing in hallway) Ambulation/Gait Assistance: 2: Max assist Locomotion: Ambulation: 0: Activity did not occur  Comprehension Comprehension Mode: Auditory Comprehension: 5-Follows basic conversation/direction: With extra time/assistive device  Expression Expression Mode: Verbal Expression: 5-Expresses basic 90% of the time/requires cueing < 10% of the time.  Social Interaction Social Interaction: 6-Interacts appropriately with others with medication or extra time (anti-anxiety, antidepressant).  Problem Solving Problem Solving: 4-Solves basic 75 - 89% of the time/requires cueing 10 - 24% of the time  Memory Memory: 4-Recognizes or recalls 75 - 89% of the time/requires cueing 10 - 24% of the time  Medical Problem List and Plan:  1. DVT Prophylaxis/Anticoagulation: Mechanical: Sequential compression devices, below knee Bilateral lower extremities  2. Pain Management: N/A  3. Mood: appropriate. Will monitor for now.   - neuropsych input and treatment appreciated 4. Neuropsych: This patient is capable of making decisions on his/her own behalf.  5. PAF: resolved. Continue low dose BB with close monitoring of HR for bradycardia as with tendency towards SSS. Keep K+ levels around 4.0.---4.3 most recently 6. HTN: Monitor with bid checks. Norvasc resumed.  7. ABLA: hgb remains around 11 8. RLE spasm: low dose baclofen prn has been helpful  -added prafo to control ankle at night--tolerating well. 9. UTI- bactrim--complete today 10. Dry mouth:  push fluids.  -bmet  LOS (Days) 15 A FACE TO FACE EVALUATION WAS PERFORMED  Rogelia Boga 11/04/2012 8:47 AM

## 2012-11-04 NOTE — Progress Notes (Signed)
Occupational Therapy Session Note  Patient Details  Name: John Montgomery MRN: [id number] Date of Birth: 07-Oct-1921  Today's Date: 11/04/2012 Time: 1430-1505 Time Calculation (min): 35 min  Short Term Goals: Week 2:  OT Short Term Goal 1 (Week 2): Pt. will dress UB with min assist  OT Short Term Goal 2 (Week 2): Pt will dress LB with mod A OT Short Term Goal 3 (Week 2): Pt will perform toilet transfer to [facility] with mod A OT Short Term Goal 4 (Week 2): Pt will perform toileting with max A  Skilled Therapeutic Interventions: Therapeutic activities with emphasis on weight-shifting/midline orientation, attention to right UE, toilet transfers and improved independence with donning orthotic (R-AFO). Patient able to participate in activity but requires vc and redirection to attend to instruction rather than offer his reservations for attempting transfers or selected activity.   Patient demonstrates fearfulness and moderate perseveration but performed task as instructed with copious verbal cues, placement assist and support to right LE, and max assist to lift and lower.   Patient demonstrated sustained static standing balance for approx 6 seconds with only contact guard and use of grab rails beside toilet.    Therapy Documentation Precautions:  Precautions Precautions: Fall Precaution Comments: Strong lateral lean to Rt, due to pushing Required Braces or Orthoses: Other Brace/Splint Other Brace/Splint: trialing some AFOs right foot Restrictions Weight Bearing Restrictions: No  Pain: Pain Assessment Pain Assessment: No/denies pain  See FIM for current functional status  Therapy/Group: Individual Therapy  Georgeanne Nim 11/04/2012, 3:44 PM

## 2012-11-04 NOTE — Plan of Care (Signed)
Problem: RH BOWEL ELIMINATION Goal: RH STG MANAGE BOWEL WITH ASSISTANCE STG Manage Bowel with min Assistance.  Outcome: Progressing Continent with the use of bedpan.

## 2012-11-05 ENCOUNTER — Inpatient Hospital Stay (HOSPITAL_COMMUNITY): Payer: Medicare Other | Admitting: Physical Therapy

## 2012-11-05 NOTE — Progress Notes (Signed)
Patient ID: John Montgomery, male   DOB: 1920-12-11, 78 y.o.   MRN: [id number] Patient ID: John Montgomery, male   DOB: 24-Sep-1921, 78 y.o.   MRN: [id number]  Patient ID: John Montgomery, male   DOB: [birth year], 78 y.o.   MRN: [id number] Subjective/Complaints:  12/22. . 78 year old patient status post left frontal SDH. Remains asymptomatic without new concerns or complaints A 12 point review of systems has been performed and if not noted above is otherwise negative.  HEENT exam negative. Chest clear cardiovascular normal S1-S2 rhythm is regular. Abdomen soft nontender no distention. Extremities no edema. SCDs in place. Right hemiparesis. GU-condom cath noted  BP Readings from Last 3 Encounters:  11/05/12 168/66  10/20/12 166/76    Objective: Vital Signs: Blood pressure 168/66, pulse 75, temperature 97.8 F (36.6 C), temperature source Oral, resp. rate 16, height 5\' 7"  (1.702 m), weight 61.145 kg (134 lb 12.8 oz), SpO2 98.00%. No results found. No results found for this basename: WBC:2,HGB:2,HCT:2,PLT:2 in the last 72 hours  Basename 11/03/12 0600  NA 135  K 4.1  CL 100  GLUCOSE 89  BUN 19  CREATININE 0.99  CALCIUM 9.2   CBG (last 3)  No results found for this basename: GLUCAP:3 in the last 72 hours  Wt Readings from Last 3 Encounters:  10/20/12 61.145 kg (134 lb 12.8 oz)  10/18/12 55.1 kg (121 lb 7.6 oz)    Physical Exam:  Mid occiput area with abrasion and dried blood without change Eyes: Pupils are equal, round, and reactive to light.  Neck: Normal range of motion.  Cardiovascular: Normal rate and regular rhythm.  Pulmonary/Chest: Effort normal and breath sounds normal.  Abdominal: Soft. Bowel sounds are normal.  Musculoskeletal: He exhibits no edema.  Neurological: He is alert and oriented to person, place, and time.  . Pleasant and appropriate. Follows commands without difficulty. Soft speech without dysarthria.+ Word finding deficits Right sided weakness with  intermittent flexor spasms of RLE. RUE is 1/5 deltoid,1+to 2 biceps, 1 triceps, grip 3-4/5. RLE is 1 at RHF,2- RKE and trace to 0/5 at ankle. Almost seems to have apraxia with movement of the right LE. Senses pain throughout right side.right central 7 and some tongue deviation, but other CN normal. DTR's are 2+ to 3+ right elbow, knee, trace resting tone right LE. Cognitively he displays good insight and awareness. Id's simple objects without issue. Occasionally struggles with longer phrases and sentences.  Skin: Skin is warm and dry. A few abrasions on scalp. Psychiatric: He has a normal mood and affect. His speech is normal. Thought content normal. His mood appears not anxious. Cognition and memory are normal.    Assessment/Plan: 1. 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 Physiatrist is providing close team supervision and 24 hour management of active medical problems listed below. Physiatrist and rehab team continue to assess barriers to discharge/monitor patient progress toward functional and medical goals.   Marland Kitchen  FIM: FIM - Bathing Bathing Steps Patient Completed: Chest;Right Arm;Abdomen;Right upper leg;Left upper leg Bathing: 3: Mod-Patient completes 5-7 44f 10 parts or 50-74%  FIM - Upper Body Dressing/Undressing Upper body dressing/undressing steps patient completed: Thread/unthread left sleeve of front closure shirt/dress;Button/unbutton shirt Upper body dressing/undressing: 1: Two helpers FIM - Lower Body Dressing/Undressing Lower body dressing/undressing steps patient completed: Thread/unthread left pants leg;Don/Doff left sock;Don/Doff left shoe Lower body dressing/undressing: 1: Two helpers  FIM -  Hotel manager Devices: Grab bar or rail for support Toileting: 1: Total-Patient completed zero steps, helper did all 3  FIM - Ambulance person Devices: Psychiatrist Transfers: 0-Activity did not occur  FIM - Banker Devices: Orthosis Bed/Chair Transfer: 3: Bed > Chair or W/C: Mod A (lift or lower assist)  FIM - Locomotion: Wheelchair Distance: 150 Locomotion: Wheelchair: 5: Travels 150 ft or more: maneuvers on rugs and over door sills with supervision, cueing or coaxing FIM - Locomotion: Ambulation Locomotion: Ambulation Assistive Devices: Other (comment) (railing in hallway) Ambulation/Gait Assistance: 2: Max assist Locomotion: Ambulation: 0: Activity did not occur  Comprehension Comprehension Mode: Auditory Comprehension: 5-Follows basic conversation/direction: With extra time/assistive device  Expression Expression Mode: Verbal Expression: 5-Expresses complex 90% of the time/cues < 10% of the time  Social Interaction Social Interaction: 6-Interacts appropriately with others with medication or extra time (anti-anxiety, antidepressant).  Problem Solving Problem Solving: 4-Solves basic 75 - 89% of the time/requires cueing 10 - 24% of the time  Memory Memory: 4-Recognizes or recalls 75 - 89% of the time/requires cueing 10 - 24% of the time  Medical Problem List and Plan:  1. DVT Prophylaxis/Anticoagulation: Mechanical: Sequential compression devices, below knee Bilateral lower extremities  2. Pain Management: N/A  3. Mood: appropriate. Will monitor for now.   - neuropsych input and treatment appreciated 4. Neuropsych: This patient is capable of making decisions on his/her own behalf.  5. PAF: resolved. Continue low dose BB with close monitoring of HR for bradycardia as with tendency towards SSS. Keep K+ levels around 4.0.---4.3 most recently 6. HTN: Monitor with bid checks. Norvasc resumed.  7. ABLA: hgb remains around 11 8. RLE spasm: low dose baclofen prn has been helpful  -added prafo to control ankle at night--tolerating  well. 9. UTI- bactrim--complete today 10. Dry mouth: push fluids.  -bmet  LOS (Days) 16 A FACE TO FACE EVALUATION WAS PERFORMED  Rogelia Boga 11/05/2012 8:11 AM

## 2012-11-05 NOTE — Progress Notes (Signed)
Physical Therapy Session Note  Patient Details  Name: John Montgomery MRN: [id number] Date of Birth: 1921/02/19  Today's Date: 11/05/2012 Time: 1015-1100 Time Calculation (min): 45 min  Short Term Goals: Week 1:  PT Short Term Goal 1 (Week 1): bed mobility with mod assist PT Short Term Goal 1 - Progress (Week 1): Progressing toward goal PT Short Term Goal 2 (Week 1): transfers bed to [facility] with mod assist to the left PT Short Term Goal 2 - Progress (Week 1): Progressing toward goal PT Short Term Goal 3 (Week 1): WC mobility mod assist to help steer PT Short Term Goal 3 - Progress (Week 1): Met PT Short Term Goal 4 (Week 1): gait 2 person assist >25' with LRAD PT Short Term Goal 4 - Progress (Week 1): Progressing toward goal PT Short Term Goal 5 (Week 1): Dynamic sitting balance min assist.   PT Short Term Goal 5 - Progress (Week 1): Progressing toward goal  Skilled Therapeutic Interventions/Progress Updates:  Patient performed WC propulsion in controlled environment >150' x2 for increased endurance with supervision and improved control for clearance on right.  Sit/stand transfers to/from [facility] in parallel bars x8 with mod A. Patient worked on lateral weight shifting and anterior/posterior weight shifting with mod A required to stabilize right knee and support right UE. Ambulation in parallel bars 6' x2 with mod A to block right knee and advance right UE/LE (bringing WC along behind patient). Patient required cuing for upright posture, stride length on left, and appropriate weight shifting.  Patient demonstrated increased hip flexion on right during swing through phase. Patient propelled WC back to room with supervision. Session concluded with patient in room in [facility] with safety belt in place.    Therapy Documentation Precautions:  Precautions Precautions: Fall Precaution Comments: Strong lateral lean to Rt, due to pushing Required Braces or Orthoses: Other Brace/Splint Other Brace/Splint:  trialing some AFOs right foot Restrictions Weight Bearing Restrictions: No    Pain: Pain Assessment Pain Assessment: No/denies pain  See FIM for current functional status  Therapy/Group: Individual Therapy  Rexene Agent 11/05/2012, 12:12 PM

## 2012-11-06 ENCOUNTER — Inpatient Hospital Stay (HOSPITAL_COMMUNITY): Payer: Medicare Other | Admitting: Physical Therapy

## 2012-11-06 ENCOUNTER — Inpatient Hospital Stay (HOSPITAL_COMMUNITY): Payer: Medicare Other | Admitting: *Deleted

## 2012-11-06 ENCOUNTER — Inpatient Hospital Stay (HOSPITAL_COMMUNITY): Payer: Medicare Other | Admitting: Speech Pathology

## 2012-11-06 ENCOUNTER — Inpatient Hospital Stay (HOSPITAL_COMMUNITY): Payer: Medicare Other

## 2012-11-06 DIAGNOSIS — W19XXXA Unspecified fall, initial encounter: Secondary | ICD-10-CM

## 2012-11-06 DIAGNOSIS — S069X9A Unspecified intracranial injury with loss of consciousness of unspecified duration, initial encounter: Secondary | ICD-10-CM

## 2012-11-06 NOTE — Progress Notes (Signed)
Occupational Therapy Session Note  Patient Details  Name: John Montgomery MRN: [id number] Date of Birth: 1921-06-20  Today's Date: 11/06/2012 Time: 0700-0755 Time Calculation (min): 55 min  Short Term Goals: Week 2:  OT Short Term Goal 1 (Week 2): Pt. will dress UB with min assist  OT Short Term Goal 2 (Week 2): Pt will dress LB with mod A OT Short Term Goal 3 (Week 2): Pt will perform toilet transfer to [facility] with mod A OT Short Term Goal 4 (Week 2): Pt will perform toileting with max A  Skilled Therapeutic Interventions/Progress Updates:    Pt in bed upon arrival but agreeable to bathing and dressing w/c level at sink.  Pt required min A for supine>sit with min verbal cues for technique.  Focus on sit<>stand at sink and static/dynamic standing balance for LB bathing and dressing tasks.  Pt requires steady A with static standing balance but mod A/max A for dynamic standing balance.  Pt exhibited decreased impulsivity this morning but continues to require min verbal cues during sit<>stand and when standing.    Therapy Documentation Precautions:  Precautions Precautions: Fall Precaution Comments: Strong lateral lean to Rt, due to pushing Required Braces or Orthoses: Other Brace/Splint Other Brace/Splint: trialing some AFOs right foot Restrictions Weight Bearing Restrictions: No Pain: Pain Assessment Pain Assessment: No/denies pain  See FIM for current functional status  Therapy/Group: Individual Therapy  Rich Brave 11/06/2012, 7:57 AM

## 2012-11-06 NOTE — Progress Notes (Signed)
Physical Therapy Note  Patient Details  Name: John Montgomery MRN: [id number] Date of Birth: [birth year] Today's Date: 11/06/2012  1430-1525 (55 minutes) individual Pain: no reported pain Focus of treatment: Neuro re-ed RT LE Treatment: neuro re-ed RT LE- (suspension grid /gravity eliminated) sidelying AA hip flexion with knee flexed to prevent extensor tone; supine hip abduction/adduction using suspension grid . Pt has difficulty isolating hip flexor movement; supine with bilateral LEs on therapy ball maintained in flexed position; bridging to isolate glut hip extension from hamstring knee/hip extension (extensor tone).    John Montgomery,[name] 11/06/2012, 7:47 AM

## 2012-11-06 NOTE — Progress Notes (Signed)
Patient ID: John Montgomery, male   DOB: 05-Mar-1921, 78 y.o.   MRN: [id number] Subjective/Complaints:  A 12 point review of systems has been performed and if not noted above is otherwise negative.  Objective: Vital Signs: Blood pressure 157/64, pulse 77, temperature 98.1 F (36.7 C), temperature source Oral, resp. rate 18, height 5\' 7"  (1.702 m), weight 61.145 kg (134 lb 12.8 oz), SpO2 97.00%. No results found. No results found for this basename: WBC:2,HGB:2,HCT:2,PLT:2 in the last 72 hours No results found for this basename: NA:2,K:2,CL:2,CO:2,GLUCOSE:2,BUN:2,CREATININE:2,CALCIUM:2 in the last 72 hours CBG (last 3)  No results found for this basename: GLUCAP:3 in the last 72 hours  Wt Readings from Last 3 Encounters:  10/20/12 61.145 kg (134 lb 12.8 oz)  10/18/12 55.1 kg (121 lb 7.6 oz)    Physical Exam:  Mid occiput area with abrasion and dried blood without change Eyes: Pupils are equal, round, and reactive to light.  Neck: Normal range of motion.  Cardiovascular: Normal rate and regular rhythm.  Pulmonary/Chest: Effort normal and breath sounds normal.  Abdominal: Soft. Bowel sounds are normal.  Musculoskeletal: He exhibits no edema.  Neurological: He is alert and oriented to person, place, and time.  . Pleasant and appropriate. Follows commands without difficulty. Soft speech without dysarthria.+ Word finding deficits Right sided weakness with intermittent flexor spasms of RLE. RUE is 1/5 deltoid,1+to 2 biceps, 1 triceps, grip 3-4/5. RLE is 1 at RHF,2- RKE and trace to 0/5 at ankle. Almost seems to have apraxia with movement of the right LE. Senses pain throughout right side.right central 7 and some tongue deviation, but other CN normal. DTR's are 2+ to 3+ right elbow, knee, trace resting tone right LE. Cognitively he displays good insight and awareness. Id's simple objects without issue. Occasionally struggles with longer phrases and sentences.  Skin: Skin is warm and dry. A few  abrasions on scalp. Psychiatric: He has a normal mood and affect. His speech is normal. Thought content normal. His mood appears not anxious. Cognition and memory are normal.    Assessment/Plan: 1. 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 Physiatrist is providing close team supervision and 24 hour management of active medical problems listed below. Physiatrist and rehab team continue to assess barriers to discharge/monitor patient progress toward functional and medical goals.   Marland Kitchen  FIM: FIM - Bathing Bathing Steps Patient Completed: Chest;Right Arm;Abdomen;Front perineal area;Right upper leg;Left upper leg Bathing: 3: Mod-Patient completes 5-7 38f 10 parts or 50-74%  FIM - Upper Body Dressing/Undressing Upper body dressing/undressing steps patient completed: Thread/unthread left sleeve of pullover shirt/dress;Put head through opening of pull over shirt/dress;Pull shirt over trunk Upper body dressing/undressing: 4: Min-Patient completed 75 plus % of tasks FIM - Lower Body Dressing/Undressing Lower body dressing/undressing steps patient completed: Thread/unthread left pants leg;Don/Doff left shoe Lower body dressing/undressing: 2: Max-Patient completed 25-49% of tasks  FIM - Hotel manager Devices: Grab bar or rail for support Toileting: 1: Total-Patient completed zero steps, helper did all 3  FIM - Diplomatic Services operational officer Devices: Psychiatrist Transfers: 0-Activity did not occur  FIM - Banker Devices: Orthosis Bed/Chair Transfer: 3: Bed > Chair or W/C: Mod A (lift or lower assist)  FIM - Locomotion: Wheelchair Distance: 150 Locomotion: Wheelchair: 5: Travels 150 ft or more: maneuvers on rugs and over door sills with supervision, cueing or coaxing FIM -  Locomotion:  Ambulation Locomotion: Ambulation Assistive Devices: Other (comment) (railing in hallway) Ambulation/Gait Assistance: 2: Max assist Locomotion: Ambulation: 0: Activity did not occur  Comprehension Comprehension Mode: Auditory Comprehension: 5-Follows basic conversation/direction: With extra time/assistive device  Expression Expression Mode: Verbal Expression: 5-Expresses complex 90% of the time/cues < 10% of the time  Social Interaction Social Interaction: 6-Interacts appropriately with others with medication or extra time (anti-anxiety, antidepressant).  Problem Solving Problem Solving: 4-Solves basic 75 - 89% of the time/requires cueing 10 - 24% of the time  Memory Memory: 4-Recognizes or recalls 75 - 89% of the time/requires cueing 10 - 24% of the time  Medical Problem List and Plan:  1. DVT Prophylaxis/Anticoagulation: Mechanical: Sequential compression devices, below knee Bilateral lower extremities  2. Pain Management: N/A  3. Mood: appropriate. Will monitor for now.   - neuropsych input and treatment appreciated 4. Neuropsych: This patient is capable of making decisions on his/her own behalf.  5. PAF: resolved. Continue low dose BB with close monitoring of HR for bradycardia as with tendency towards SSS. Keep K+ levels around 4.0.---4.3 most recently 6. HTN: Monitor with bid checks. Norvasc resumed.  7. ABLA: hgb remains around 11 8. RLE spasm: low dose baclofen prn has been helpful  -added prafo to control ankle at night--tolerating well. 9. UTI- bactrim--complete today 10. Dry mouth: push fluids.  -bmet 12/20 looked normal  LOS (Days) 17 A FACE TO FACE EVALUATION WAS PERFORMED  KIRSTEINS,ANDREW E 11/06/2012 8:16 AM

## 2012-11-06 NOTE — Progress Notes (Signed)
Occupational Therapy Note  Patient Details  Name: John Montgomery MRN: [id number] Date of Birth: 1921/06/16 Today's Date: 11/06/2012  1130- 1210  (40 min) Pain:  None Individual session  Engaged in therapeutic activity with wc propulsion into bathroom, siiting balance, transfers to toilet with pt verbalizing the sequence for transfer.  Pt moved with mod assist to toilet and doffed pants with mod assist for balance.  Pt. Stood with min to close supervision for 1-2 minutes holding to grab bar.  Pt. Had Bm.  Stood again for pericare with close supervision to minimal assist.  Transferred to right side back to wc with mod assist.  Max facilitation for rotational component with transfer.  Pt washed hands at sink.  Returned place beside bed with call bell and lunch tray in place.    Humberto Seals 11/06/2012, 11:48 AM

## 2012-11-06 NOTE — Progress Notes (Signed)
Speech Language Pathology Daily Session Note  Patient Details  Name: John Montgomery MRN: [id number] Date of Birth: [birth year]  Today's Date: 11/06/2012 Time: 1000-1100 Time Calculation (min): 60 min  Short Term Goals: Week 2: SLP Short Term Goal 1 (Week 2): Pt will demonstrate basic problem solving with functional tasks with Min verbal cues  SLP Short Term Goal 2 (Week 2): Pt will recall new, daily information with supervision verbal cues.  SLP Short Term Goal 3 (Week 2): Pt will demonstrate mildly complex thoughts with supervision verbal cues with extra time.   Skilled Therapeutic Interventions: Treatment focus on speech goals. Pt required supervision verbal cues to self-monitor and correct speech errors and to decrease speech rate and increase vocal intensity at the conversation level. Pt also required supervision verbal cues to attend to right environment during wheelchair propulsion activity.    FIM:  Comprehension Comprehension Mode: Auditory Comprehension: 5-Follows basic conversation/direction: With extra time/assistive device Expression Expression Mode: Verbal Expression: 5-Expresses basic needs/ideas: With extra time/assistive device Social Interaction Social Interaction: 5-Interacts appropriately 90% of the time - Needs monitoring or encouragement for participation or interaction. Problem Solving Problem Solving: 4-Solves basic 75 - 89% of the time/requires cueing 10 - 24% of the time Memory Memory: 4-Recognizes or recalls 75 - 89% of the time/requires cueing 10 - 24% of the time  Pain Pain Assessment Pain Assessment: No/denies pain  Therapy/Group: Individual Therapy  Akeema Broder 11/06/2012, 4:05 PM

## 2012-11-06 NOTE — Progress Notes (Signed)
Physical Therapy Note  Patient Details  Name: John Montgomery MRN: [id number] Date of Birth: 1921/08/07 Today's Date: 11/06/2012  Time: 900-955 55 minutes  No c/o pain.  W/c mobility throughout unit with supervision, supervision cuing for obstacle negotiation.  Gait training at railing in hallway with mod A 2 x 40'.  Pt with good upright trunk posture, requires manual facilitation for anterior wt shift of R hip in stance phase, assist to advance R LE due to increased tone, manual facilitation for wt shifting and verbal cuing for sequencing and safety.  Pt able to improve weight shifts with repetition.  Attempt gait with EVA walker.  Pt able to grasp handles with B UEs, limited by increased R LE tone making it difficult for PT to assist with swing phase.  Tall kneeling for focus on trunk and hip control and coordination.  Pt required +2 assist to achieve position, able to perform wt shifts and trunk extension with mod - max manual facilitation.  Individual therapy   Lenford Beddow 11/06/2012, 9:57 AM

## 2012-11-06 NOTE — Progress Notes (Signed)
Occupational Therapy Weekly Progress Note  Patient Details  Name: John Montgomery MRN: [id number] Date of Birth: Jun 14, 1921  Today's Date: 11/06/2012  Patient has met 4 of 4 short term goals.  Pt has made steady progress with bathing, dressing, toilet transfers, and toileting.  Pt has demonstrated steady gains in static/dynamic standing balance.  Pt continues to require verbal cues to prepare for sit<>stand and to maintain standing balance at midline.  Pt is incorporating compensatory strategies/techniques into bathing and dressing with min verbal cues.  Patient continues to demonstrate the following deficits:  Balance in sitting and standing, transfers, Right sided weakness, endurance, postural control  and therefore will continue to benefit from skilled OT intervention to enhance overall performance with BADL.  Patient progressing toward long term goals..  Continue plan of care.  OT Short Term Goals Week 2:  OT Short Term Goal 1 (Week 2): Pt. will dress UB with min assist  OT Short Term Goal 1 - Progress (Week 2): Met OT Short Term Goal 2 (Week 2): Pt will dress LB with mod A OT Short Term Goal 2 - Progress (Week 2): Met OT Short Term Goal 3 (Week 2): Pt will perform toilet transfer to [facility] with mod A OT Short Term Goal 3 - Progress (Week 2): Met OT Short Term Goal 4 (Week 2): Pt will perform toileting with max A OT Short Term Goal 4 - Progress (Week 2): Met  Therapy Documentation Precautions:  Precautions Precautions: Fall Precaution Comments: Strong lateral lean to Rt, due to pushing Required Braces or Orthoses: Other Brace/Splint Other Brace/Splint: trialing some AFOs right foot Restrictions Weight Bearing Restrictions: No Pain: Pain Assessment Pain Assessment: No/denies pain  See FIM for current functional status  Therapy/Group: Individual Therapy  Rich Brave 11/06/2012, 9:59 AM

## 2012-11-07 ENCOUNTER — Inpatient Hospital Stay (HOSPITAL_COMMUNITY): Payer: Medicare Other

## 2012-11-07 ENCOUNTER — Inpatient Hospital Stay (HOSPITAL_COMMUNITY): Payer: Medicare Other | Admitting: Physical Therapy

## 2012-11-07 NOTE — Progress Notes (Signed)
Occupational Therapy Note  Patient Details  Name: John Montgomery MRN: [id number] Date of Birth: 1921/04/26 Today's Date: 11/07/2012  Time: 1300-1345 Pt denies pain Individual therapy  Pt engaged in RUE neuro reeducation including weight bearing through elbow and shoulder followed by facilitated movement for shoulder flexion and extension as well as elbow flexion and extension.  Pt demonstrating limited elbow flexion and extension both in supine and when sitting supported and unsuppported.  Pt demonstrating trace shoulder flexion and extension.  Pt demonstrating increased trunk control with transitional movements but continues to require verbal cues for correct positioning and to control movements.   [name] [facility] 11/07/2012, 2:29 PM

## 2012-11-07 NOTE — Progress Notes (Signed)
Physical Therapy Note  Patient Details  Name: JAASIEL HOLLYFIELD MRN: [id number] Date of Birth: 07-31-1921 Today's Date: 11/07/2012  Time: 1345-1441 56 minutes  No c/o pain.  Gait training at railing in hallway with mod A 2 x 40' with assist to advance R LE and manual facilitation for wt shifts, posture, verbal cues for sequencing.  Transfer training for squat pivot transfers with min cuing for technique, mod A for forward lean and wt shifts.  Stair training to facilitate wt bearing through R LE and decrease L side pushing.  Pt able to step up with L LE and hold foot on step without UE support for 10-15 seconds multiple attempts before becoming fatigued.  Pt pushes harder with L side when fatigued. Standing balance/mini squats with focus on decreasing pushing and maintaining equal wt bearing B LEs with min-mod A.  Nu step to promote reciprocal movement patterns and increase R LE strength x 5 mins with assist for R LE adduction/IR on machine.  Pt more fatigued this session requiring more frequent rest breaks but continues with strong motivation to participate.  Individual therapy  Joandry Slagter 11/07/2012, 2:45 PM

## 2012-11-07 NOTE — Progress Notes (Signed)
Speech Language Pathology Daily Session Note  Patient Details  Name: AVERILL PONS MRN: [id number] Date of Birth: August 14, 1921  Today's Date: 11/07/2012 Time: 1478-2956 Time Calculation (min): 41 min  Short Term Goals: Week 2: SLP Short Term Goal 1 (Week 2): Pt will demonstrate basic problem solving with functional tasks with Min verbal cues  SLP Short Term Goal 2 (Week 2): Pt will recall new, daily information with supervision verbal cues.  SLP Short Term Goal 3 (Week 2): Pt will demonstrate mildly complex thoughts with supervision verbal cues with extra time.   Skilled Therapeutic Interventions: Treatment focused on basic functional problem solving, speech intelligibility, awareness of deficits, sustained attention and awareness of right LE during use of wheelchair. Min verbal cues provided throughout for cognitive skills. Moderate verbal cues, model necessary for slow rate, increased vocal intensity at conversation level.    FIM:  Comprehension Comprehension: 6-Follows complex conversation/direction: With extra time/assistive device Expression Expression: 5-Expresses complex 90% of the time/cues < 10% of the time Social Interaction Social Interaction: 5-Interacts appropriately 90% of the time - Needs monitoring or encouragement for participation or interaction. Problem Solving Problem Solving: 4-Solves basic 75 - 89% of the time/requires cueing 10 - 24% of the time Memory Memory: 4-Recognizes or recalls 75 - 89% of the time/requires cueing 10 - 24% of the time  Pain Pain Assessment Pain Assessment: No/denies pain  Therapy/Group: Individual Therapy  Alayne Estrella, Riley Nearing 11/07/2012, 1:27 PM

## 2012-11-07 NOTE — Progress Notes (Signed)
Discharge summary [phone]

## 2012-11-07 NOTE — Progress Notes (Signed)
Occupational Therapy Session Note  Patient Details  Name: John Montgomery MRN: [id number] Date of Birth: Aug 04, 1921  Today's Date: 11/07/2012 Time: 0700-0755 Time Calculation (min): 55 min  Short Term Goals: Week 3:  OT Short Term Goal 1 (Week 3): STG=LTG secondary to ELOS  Skilled Therapeutic Interventions/Progress Updates:    Pt in bed upon arrival and stated he wanted to show me what he could do with RUE while laying in bed.  Pt exhibited elbow flexion and extension as well as trace shoulder adduction while in supine position.  Pt performed supine->sit EOB with min A.  Pt engaged in bathing and dressing tasks w/c level at sink with focus on static standing balance, sit<>stand, dynamic standing balance, and compensatory strategies/techniques.  Pt presently requires only steady A for static standing but when engaged in functional task pt loses balance to right and is unable to correct while still engaged in task, requiring mod A.  Pt initiated using RUE to bathe LUE and exhibited elbow flexion and extension while seated in w/c.    Therapy Documentation Precautions:  Precautions Precautions: Fall Precaution Comments: Strong lateral lean to Rt, due to pushing Required Braces or Orthoses: Other Brace/Splint Other Brace/Splint: trialing some AFOs right foot Restrictions Weight Bearing Restrictions: No Pain: Pain Assessment Pain Assessment: No/denies pain  See FIM for current functional status  Therapy/Group: Individual Therapy  Rich Brave 11/07/2012, 7:57 AM

## 2012-11-07 NOTE — Progress Notes (Signed)
Physical Therapy Note  Patient Details  Name: John Montgomery MRN: [id number] Date of Birth: 01/25/1921 Today's Date: 11/07/2012  Time: 830-925 55 minutes  No c/o pain.  Treatment session focused on NMR to improve midline, decrease tone and normalize movement patterns.  Sit to stand and squats without UE support with manual facilitation for forward lean and wt bearing equally B LEs.  Pt tends to withdraw R LE and needs facilitation to wt bear in standing and squatted position.  Scooting edge of mat with facilitation for forward lean and for wt shifts bilaterally.  Supine NMR focusing on trunk and neck extension with mini crunches.  B LE flexion with LEs on ball.  Rolling B with focus in trunk and neck flexion and core activation to assist roll.  Individual therapy   DONAWERTH,KAREN 11/07/2012, 9:23 AM

## 2012-11-07 NOTE — Patient Care Conference (Signed)
Inpatient RehabilitationTeam Conference and Plan of Care Update Date: 11/06/2012   Time: 2:55 PM    Patient Name: John Montgomery      Medical Record Number: [id number]  Date of Birth: May 31, 1921 Sex: Male         Room/Bed: 4009/4009-01 Payor Info: Payor: MEDICARE  Plan: MEDICARE PART A AND B  Product Type: *No Product type*     Admitting Diagnosis: LT ICH   Admit Date/Time:  10/20/2012  2:14 PM Admission Comments: No comment available   Primary Diagnosis:  SDH (subdural hematoma) Principal Problem: SDH (subdural hematoma)  Patient Active Problem List   Diagnosis Date Noted  . PAF (paroxysmal atrial fibrillation) 10/19/2012  . First degree AV block 10/19/2012  . SDH (subdural hematoma) 10/19/2012  . HTN (hypertension) 10/19/2012  . CAD, RCA PTCA '98, RCA DES '06. Low risk Nuc 2011 10/19/2012  . Dyslipidemia 10/19/2012  . PVD, moderate RICA disease 10/19/2012  . Diastolic dysfunction, grade 1 with an EF of 50-55% 2011 10/19/2012    Expected Discharge Date: Expected Discharge Date:  (SNF)  Team Members Present: Physician leading conference: Dr. Claudette Montgomery Social Worker Present: John Jupiter, LCSW Nurse Present: John Purl, RN PT Present: John Montgomery, PT OT Present: John Montgomery, OT SLP Present: John Montgomery, SLP     Current Status/Progress Goal Weekly Team Focus  Medical   no medical issues  maintain stability  Prepare fotr SNF placement   Bowel/Bladder   Occasional  incontinent episode at [facility]; Condom cath at present at [facility]  continent  toilet at HS and x1 during night   Swallow/Nutrition/ Hydration             ADL's   mod A overall, mod A/min A sit<>stand, min A static standing balance  min A overall   RUE function; sitting balance; standing balance; transfers    Mobility   min-mod a  min A  transfers, gait training   Communication   Supervision  Mod I  increased utilization of speech intelligibility strategies   Safety/Cognition/ Behavioral  Observations  Supervision-Min A  Supervision  problem solving, working memory   Pain   occasional c/o's right sided pain  managed   monitor, offer.   Skin   Posterior scalp with dry abrasion, scabbed; scattered bruisinf; large ecchymosis right upper arm.  In healing process at D/C.  monitor, reposition      *See Interdisciplinary Assessment and Plan and progress notes for long and short-term goals  Barriers to Discharge: Progressing very slowly    Possible Resolutions to Barriers:  SNF    Discharge Planning/Teaching Needs:  Plan changed to SNF      Team Discussion:  No medical issues. Continues to make slow, steady progress. Team feels SNF is best option at this point  Revisions to Treatment Plan:  D/C plan changed to SNF   Continued Need for Acute Rehabilitation Level of Care: The patient requires daily medical management by a physician with specialized training in physical medicine and rehabilitation for the following conditions: Daily direction of a multidisciplinary physical rehabilitation program to ensure safe treatment while eliciting the highest outcome that is of practical value to the patient.: Yes Daily analysis of laboratory values and/or radiology reports with any subsequent need for medication adjustment of medical intervention for : Neurological problems  John Montgomery 11/07/2012, 9:24 AM

## 2012-11-07 NOTE — Progress Notes (Signed)
Social Work Patient ID: John Montgomery, male   DOB: [birth year], 78 y.o.   MRN: [id number]   Have received SNF offer today from [facility] who can admit pt on Thursday 12/26.  Pt and family agreeable with plan to transfer Thursday via ambulance around 11 am.  Have alerted treatment team as well.  Maisa Bedingfield, LCSW

## 2012-11-07 NOTE — Progress Notes (Signed)
Patient ID: GARVIS DOWNUM, male   DOB: 1921-08-13, 78 y.o.   MRN: [id number] Subjective/Complaints:  A 12 point review of systems has been performed and if not noted above is otherwise negative.  Objective: Vital Signs: Blood pressure 166/54, pulse 73, temperature 97.6 F (36.4 C), temperature source Oral, resp. rate 18, height 5\' 7"  (1.702 m), weight 61.145 kg (134 lb 12.8 oz), SpO2 97.00%. No results found. No results found for this basename: WBC:2,HGB:2,HCT:2,PLT:2 in the last 72 hours No results found for this basename: NA:2,K:2,CL:2,CO:2,GLUCOSE:2,BUN:2,CREATININE:2,CALCIUM:2 in the last 72 hours CBG (last 3)  No results found for this basename: GLUCAP:3 in the last 72 hours  Wt Readings from Last 3 Encounters:  10/20/12 61.145 kg (134 lb 12.8 oz)  10/18/12 55.1 kg (121 lb 7.6 oz)    Physical Exam:   Eyes: Pupils are equal, round, and reactive to light.  Neck: Normal range of motion.  Cardiovascular: Normal rate and regular rhythm.  Pulmonary/Chest: Effort normal and breath sounds normal.  Abdominal: Soft. Bowel sounds are normal.  Musculoskeletal: He exhibits no edema.  Neurological: He is alert and oriented to person, place, and time.  . Pleasant and appropriate. Follows commands without difficulty. Soft speech without dysarthria.+ Word finding deficits Right sided weakness with intermittent flexor spasms of RLE. RUE is 1/5 deltoid,1+to 2 biceps, 1 triceps, grip 3-4/5. RLE is 1 at RHF,2- RKE and trace to 0/5 at ankle. Almost seems to have apraxia with movement of the right LE. Senses pain throughout right side.right central 7 and some tongue deviation, but other CN normal. DTR's are 2+ to 3+ right elbow, knee, trace resting tone right LE. Cognitively he displays good insight and awareness. Id's simple objects without issue. Occasionally struggles with longer phrases and sentences.  Skin: Skin is warm and dry.R base of 5th toe callus no erythema or swelling Psychiatric: He has a  normal mood and affect. His speech is normal. Thought content normal. His mood appears not anxious. Cognition and memory are normal.    Assessment/Plan: 1. 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 Physiatrist is providing close team supervision and 24 hour management of active medical problems listed below. Physiatrist and rehab team continue to assess barriers to discharge/monitor patient progress toward functional and medical goals.   Marland Kitchen  FIM: FIM - Bathing Bathing Steps Patient Completed: Chest;Right Arm;Abdomen;Front perineal area;Right upper leg;Left upper leg;Left lower leg (including foot) Bathing: 3: Mod-Patient completes 5-7 75f 10 parts or 50-74%  FIM - Upper Body Dressing/Undressing Upper body dressing/undressing steps patient completed: Thread/unthread left sleeve of pullover shirt/dress;Put head through opening of pull over shirt/dress;Pull shirt over trunk Upper body dressing/undressing: 4: Min-Patient completed 75 plus % of tasks FIM - Lower Body Dressing/Undressing Lower body dressing/undressing steps patient completed: Thread/unthread left pants leg;Don/Doff left sock;Don/Doff left shoe Lower body dressing/undressing: 2: Max-Patient completed 25-49% of tasks  FIM - Toileting Toileting steps completed by patient: Adjust clothing prior to toileting Toileting Assistive Devices: Grab bar or rail for support Toileting: 2: Max-Patient completed 1 of 3 steps  FIM - Diplomatic Services operational officer Devices: Grab bars Toilet Transfers: 3-To toilet/BSC: Mod A (lift or lower assist);3-From toilet/BSC: Mod A (lift or lower assist)  FIM - Banker Devices: Orthosis Bed/Chair Transfer: 4: Bed > Chair or W/C: Min A (steadying Pt. > 75%);4: Chair or W/C > Bed: Min A (steadying Pt. >  75%)  FIM - Locomotion:  Wheelchair Distance: 150 Locomotion: Wheelchair: 5: Travels 150 ft or more: maneuvers on rugs and over door sills with supervision, cueing or coaxing FIM - Locomotion: Ambulation Locomotion: Ambulation Assistive Devices: Other (comment) (railing in hallway) Ambulation/Gait Assistance: 2: Max assist Locomotion: Ambulation: 1: Travels less than 50 ft with moderate assistance (Pt: 50 - 74%)  Comprehension Comprehension Mode: Auditory Comprehension: 5-Follows basic conversation/direction: With extra time/assistive device  Expression Expression Mode: Verbal Expression: 5-Expresses basic needs/ideas: With extra time/assistive device  Social Interaction Social Interaction: 5-Interacts appropriately 90% of the time - Needs monitoring or encouragement for participation or interaction.  Problem Solving Problem Solving: 4-Solves basic 75 - 89% of the time/requires cueing 10 - 24% of the time  Memory Memory: 4-Recognizes or recalls 75 - 89% of the time/requires cueing 10 - 24% of the time  Medical Problem List and Plan:  1. DVT Prophylaxis/Anticoagulation: Mechanical: Sequential compression devices, below knee Bilateral lower extremities  2. Pain Management: N/A  3. Mood: appropriate. Will monitor for now.   - neuropsych input and treatment appreciated 4. Neuropsych: This patient is capable of making decisions on his/her own behalf.  5. PAF: resolved. Continue low dose BB with close monitoring of HR for bradycardia as with tendency towards SSS. Keep K+ levels around 4.0.---4.1 most recently 6. HTN: Monitor with bid checks. Norvasc resumed.  7. ABLA: hgb remains around 11 8. RLE spasm: low dose baclofen prn has been helpful  -added prafo to control ankle at night--tolerating well. 9. UTI- bactrim--complete today 10. Dry mouth: push fluids.  -bmet 12/20 looked normal 11. R 5th MTP pain and callus will order duoderm, may be rubbing against AFO LOS (Days) 18 A FACE TO FACE EVALUATION WAS  PERFORMED  KIRSTEINS,ANDREW E 11/07/2012 8:07 AM

## 2012-11-08 MED ORDER — AMLODIPINE BESYLATE 10 MG PO TABS
10.0000 mg | ORAL_TABLET | Freq: Every day | ORAL | Status: DC
  Administered 2012-11-08 – 2012-11-09 (×2): 10 mg via ORAL
  Filled 2012-11-08 (×3): qty 1

## 2012-11-08 NOTE — Discharge Summary (Signed)
NAMEKACPER, CARTLIDGE                ACCOUNT NO.:  [id number]  MEDICAL RECORD NO.:  [id number]  LOCATION:  4009                         FACILITY:  [facility]  PHYSICIAN:  Ranelle Oyster, M.D.DATE OF BIRTH:  January 01, 1921  DATE OF ADMISSION:  10/20/2012 DATE OF DISCHARGE:  11/09/2012                              DISCHARGE SUMMARY   DISCHARGE DIAGNOSES: 1. Right frontal lobe intracerebral hemorrhage. 2. Paroxysmal atrial fibrillation. 3. Hypertension. 4. Acute blood loss anemia. 5. Enterobacter urinary tract infection.  HISTORY OF PRESENT ILLNESS:  Mr. Saim Almanza is an 78 year old male who was knocked over by neighbor's dog while out on a walk on October 17, 2012.  He was noted to have worsening of speech with inability to use right side and was admitted for workup.  CT of head done revealed a left frontal lobe intraparenchymal hemorrhage with edema and mass effect.  CT of cervical spine shows stable spondylosis.  He was evaluated by Dr. Franky Macho who recommended following with serial CT for now.  He did have an episode of PAF with flatter and Dr. Herbie Baltimore was consulted for input. Low-dose beta-blocker added with monitoring of heart rate as the patient has tendency toward sick sinus syndrome.  Recommendations also were to resume his blood pressure medicines and keep K level closer to 4.0. Therapies were initiated and the patient is limited by right-sided weakness with right lower extremity instability, difficulty tracking as well as easy distractibility and poor awareness of deficits.  Therapy team recommended CIR for progressive therapy.  PAST MEDICAL HISTORY: 1. Hypertension. 2. Hyperlipidemia. 3. History of MI.  FUNCTIONAL HISTORY:  The patient was independent and active prior to admission.  He walks 2 miles daily.  Does the housework and cooking. Did not use any assistive device.  FUNCTIONAL STATUS:  The patient was mod-to-max assist for bed mobility, +2 total assist 60-70% for  transfers.  He required total assist for lower body bathing and dressing tasks, max assist for upper body dressing.  LABORATORY TEST:  Check of lytes from November 03, 2012, reveals sodium 135, potassium 4.1, chloride 100, CO2 of 26, BUN 19, creatinine 0.99, glucose 89.  Most recent CBC reveals hemoglobin 10.9, hematocrit 32.2, white count 6.6, platelets 175.  Urine culture of October 22, 2012, showed greater than 100,000 colonies of Enterobacter aerogenes.  HOSPITAL COURSE:  Mr. Stewart Sasaki was admitted to Rehab on October 20, 2012, for inpatient therapies to consist of PT, OT, and speech therapy at least 3 hours 5 days a week.  Past admission, physiatrist, rehab RN, and therapy team have worked together to provide customized collaborative interdisciplinary care.  Rehab RN has worked with the patient on bowel and bladder program.  The patient's blood pressures have been checked on b.i.d. basis and these are currently ranging with systolics in 150s-160s range.  He was limited by spasticity of right side and baclofen was added on p.r.n. basis.  He was noted to have Enterobacter UTI and was treated with Bactrim for this.  PRAFO was added to help with ankle control at night.  The patient was noted to have complaints of dry mouth with lytes showing evidence of renal insufficiency with BUN at  26.  He was encouraged to push p.o. fluids and followup labs have shown improvement in renal status.  Dr. Wylene Simmer, neuropsychologist was consulted due to patient's anxious and depressive- type mood.  She felt that the patient with diagnosis most consistent with adjustment disorder with depression and anxiety.  He has been followed along with the ego support offered by therapy team.  During the patient's stay in Rehab, weekly team conferences were held to monitor patient's progress, set goals, as well as discuss barriers to discharge.  The patient continues to be limited by right-sided weakness with  poor posture as well as difficulty weightbearing through right lower extremity.  Neuromuscular reeducation is ongoing to improve midline, decreased tone, and normalized pattern.  The patient is at min- to-mod assist for transfers.  He is at [facility] assist for lower body dressing.  He is incorporating strategies for bathing and dressing tasks with min-to-verbal cues.  Speech Therapy has been working with the patient on his speech as well as cognitive deficits.  The patient requires supervision with supervisory verbal cues to self monitor and correct for speech errors and decreased speech rate as well as increased vocal intensity in conversation level.  He is able to attend to tasks for 30 minutes with verbal cues for redirection.  He is able to express basic needs at 90% accuracy, able to solve basic problems at 75-90% accuracy with occasional cuing.  The patient continues to require assistance and family has elected on SNF for progressive therapies.  Bed is available for November 09, 2012, and the patient is to be discharged from this facility in improved condition.  DISCHARGE MEDICATIONS: 1. Amlodipine 5 mg p.o. per day. 2. Atorvastatin 20 mg p.o. per day. 3. Metoprolol 12.5 mg p.o. b.i.d. 4. Protonix 40 mg p.o. at bedtime. 5. K-Dur 10 mEq p.o. b.i.d. 6. Senokot-S 1 p.o. b.i.d. 7. Baclofen 5 mg p.o. q.8 h. p.r.n. spasms. 8. Tylenol 650 mg p.o. q.4 h. p.r.n. pain. 9. Trazodone 50 mg p.o. at bedtime p.r.n. insomnia.  DIET:  Regular.  ACTIVITY:  As tolerated at wheelchair level with supervision.  FOLLOWUP:  The patient to follow up with Dr. Riley Kill in 4 weeks, follow up with Dr. Franky Macho in the next 3-4 weeks, follow up with MD at [facility] for medical issues.     Delle Reining, P.A.   ______________________________ Ranelle Oyster, M.D.    PL/MEDQ  D:  11/07/2012  T:  11/08/2012  Job:  [id number]  cc:   Landry Corporal, MD Veverly Fells. Al[place]imer, M.D. Coletta Memos, M.D.

## 2012-11-08 NOTE — Progress Notes (Signed)
Patient ID: John Montgomery, male   DOB: 1921-08-28, 78 y.o.   MRN: [id number] Subjective/Complaints: Moving R arm better A 12 point review of systems has been performed and if not noted above is otherwise negative.  Objective: Vital Signs: Blood pressure 162/66, pulse 76, temperature 97.9 F (36.6 C), temperature source Oral, resp. rate 18, height 5\' 7"  (1.702 m), weight 61.145 kg (134 lb 12.8 oz), SpO2 97.00%. No results found. No results found for this basename: WBC:2,HGB:2,HCT:2,PLT:2 in the last 72 hours No results found for this basename: NA:2,K:2,CL:2,CO:2,GLUCOSE:2,BUN:2,CREATININE:2,CALCIUM:2 in the last 72 hours CBG (last 3)  No results found for this basename: GLUCAP:3 in the last 72 hours  Wt Readings from Last 3 Encounters:  10/20/12 61.145 kg (134 lb 12.8 oz)  10/18/12 55.1 kg (121 lb 7.6 oz)    Physical Exam:   Eyes: Pupils are equal, round, and reactive to light.  Neck: Normal range of motion.  Cardiovascular: Normal rate and regular rhythm.  Pulmonary/Chest: Effort normal and breath sounds normal.  Abdominal: Soft. Bowel sounds are normal.  Musculoskeletal: He exhibits no edema.  Neurological: He is alert and oriented to person, place, and time.  . Pleasant and appropriate. Follows commands without difficulty. Soft speech without dysarthria.+ Word finding deficits Right sided weakness with intermittent flexor spasms of RLE. RUE is 2-/5 deltoid,1+to 2 biceps, 1 triceps, grip 3-4/5. RLE is 1 at RHF,2- RKE and trace to 0/5 at ankle. Almost seems to have apraxia with movement of the right LE. Senses pain throughout right side.right central 7 and some tongue deviation, but other CN normal. DTR's are 2+ to 3+ right elbow, knee, trace resting tone right LE. Cognitively he displays good insight and awareness. Id's simple objects without issue. Occasionally struggles with longer phrases and sentences.  Skin: Skin is warm and dry.R base of 5th toe callus no erythema or  swelling Psychiatric: He has a normal mood and affect. His speech is normal. Thought content normal. His mood appears not anxious. Cognition and memory are normal.    Assessment/Plan: 1. 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 Physiatrist is providing close team supervision and 24 hour management of active medical problems listed below. Physiatrist and rehab team continue to assess barriers to discharge/monitor patient progress toward functional and medical goals.   Marland Kitchen  FIM: FIM - Bathing Bathing Steps Patient Completed: Chest;Right Arm;Abdomen;Front perineal area;Right upper leg;Left upper leg;Left lower leg (including foot) Bathing: 3: Mod-Patient completes 5-7 9f 10 parts or 50-74%  FIM - Upper Body Dressing/Undressing Upper body dressing/undressing steps patient completed: Thread/unthread left sleeve of pullover shirt/dress;Put head through opening of pull over shirt/dress;Pull shirt over trunk Upper body dressing/undressing: 4: Min-Patient completed 75 plus % of tasks FIM - Lower Body Dressing/Undressing Lower body dressing/undressing steps patient completed: Thread/unthread left pants leg;Don/Doff left sock;Don/Doff left shoe Lower body dressing/undressing: 2: Max-Patient completed 25-49% of tasks  FIM - Toileting Toileting steps completed by patient: Adjust clothing prior to toileting Toileting Assistive Devices: Grab bar or rail for support Toileting: 2: Max-Patient completed 1 of 3 steps  FIM - Diplomatic Services operational officer Devices: Grab bars Toilet Transfers: 3-To toilet/BSC: Mod A (lift or lower assist);3-From toilet/BSC: Mod A (lift or lower assist)  FIM - Banker Devices: Orthosis Bed/Chair Transfer: 4: Chair or W/C > Bed: Min A (steadying Pt. > 75%);4: Bed > Chair or W/C: Min  A (steadying  Pt. > 75%)  FIM - Locomotion: Wheelchair Distance: 150 Locomotion: Wheelchair: 5: Travels 150 ft or more: maneuvers on rugs and over door sills with supervision, cueing or coaxing FIM - Locomotion: Ambulation Locomotion: Ambulation Assistive Devices: Other (comment) (railing in hallway) Ambulation/Gait Assistance: 2: Max assist Locomotion: Ambulation: 1: Travels less than 50 ft with moderate assistance (Pt: 50 - 74%)  Comprehension Comprehension Mode: Auditory Comprehension: 6-Follows complex conversation/direction: With extra time/assistive device  Expression Expression Mode: Verbal Expression: 5-Expresses complex 90% of the time/cues < 10% of the time  Social Interaction Social Interaction: 5-Interacts appropriately 90% of the time - Needs monitoring or encouragement for participation or interaction.  Problem Solving Problem Solving: 4-Solves basic 75 - 89% of the time/requires cueing 10 - 24% of the time  Memory Memory: 4-Recognizes or recalls 75 - 89% of the time/requires cueing 10 - 24% of the time  Medical Problem List and Plan:  1. DVT Prophylaxis/Anticoagulation: Mechanical: Sequential compression devices, below knee Bilateral lower extremities  2. Pain Management: N/A  3. Mood: appropriate. Will monitor for now.   - neuropsych input and treatment appreciated 4. Neuropsych: This patient is capable of making decisions on his/her own behalf.  5. PAF: resolved. Continue low dose BB with close monitoring of HR for bradycardia as with tendency towards SSS. Keep K+ levels around 4.0.---4.1 most recently 6. HTN: Monitor with bid checks. Norvasc resumed systolic still elevated with increase dose.  7. ABLA: hgb remains around 11 8. RLE spasm: low dose baclofen prn has been helpful  -added prafo to control ankle at night--tolerating well. 9. UTI- bactrim--complete today 10. Dry mouth: push fluids.Biotene  -bmet 12/20 looked normal 11. R 5th MTP pain and callus will order  duoderm, may be rubbing against AFO LOS (Days) 19 A FACE TO FACE EVALUATION WAS PERFORMED  KIRSTEINS,ANDREW E 11/08/2012 6:08 AM

## 2012-11-09 ENCOUNTER — Inpatient Hospital Stay (HOSPITAL_COMMUNITY): Payer: Medicare Other | Admitting: Physical Therapy

## 2012-11-09 ENCOUNTER — Inpatient Hospital Stay (HOSPITAL_COMMUNITY): Payer: Medicare Other | Admitting: Speech Pathology

## 2012-11-09 ENCOUNTER — Encounter (HOSPITAL_COMMUNITY): Payer: Medicare Other | Admitting: Occupational Therapy

## 2012-11-09 NOTE — Progress Notes (Signed)
Occupational Therapy Discharge Summary  Patient Details  Name: John Montgomery MRN: [id number] Date of Birth: 02/11/1921  Today's Date: 11/09/2012 Time: 0730-0815 Time Calculation (min): 45 min  1:1 GRAD DAY: self care retraining at sink level with attention to sit to stand, standing balance, clothing management in standing, focus on forward weight shifts and maintaining posture at midline, functional use if left UE with all tasks, LB dressing using adaptive strategies with use of left hand as stabilizer to gross assist  Patient has met 11 of 11 long term goals due to improved activity tolerance, improved balance , postural control, functional use of  RIGHT upper and RIGHT lower extremity, improved attention, improved awareness and improved coordination since time of evaluation.  Patient to discharge at overall min assist level with basic mobility and ADLs. Pt continues to have deficits in muscle weakness, impaired timing and sequencing, abnormal tone, unbalanced muscle activation, motor apraxia, decreased coordination and decreased motor planning, decreased midline orientation and decreased attention to right, decreased attention, decreased awareness, decreased problem solving, decreased safety awareness and decreased memory and decreased standing balance, decreased postural control, hemiplegia and decreased balance strategies and pusher syndrome requiring more assistance than his family can provide at this time. Pt would benefit from ongoing therapy before returning home. Reasons goals not met: n/a  Recommendation:  Patient will benefit from ongoing skilled OT services in skilled nursing facility setting to continue to advance functional skills in the area of BADL and Reduce care partner burden.  Equipment: No equipment provided  Reasons for discharge: treatment goals met and discharge from hospital  Patient/family agrees with progress made and goals achieved: Yes  OT  Discharge Precautions/Restrictions  Precautions Precautions: Fall    Vital Signs Therapy Vitals BP: 145/66 mmHg Pain Pain Assessment Pain Assessment: No/denies pain Pain Score: 0-No pain ADL ADL Eating: Supervision/safety Where Assessed-Eating: Wheelchair Grooming: Minimal assistance Where Assessed-Grooming: Sitting at sink Upper Body Bathing: Minimal assistance Where Assessed-Upper Body Bathing: Sitting at sink Lower Body Bathing: Moderate assistance Where Assessed-Lower Body Bathing: Sitting at sink;Standing at sink Upper Body Dressing: Moderate assistance Where Assessed-Upper Body Dressing: Sitting at sink Lower Body Dressing: Maximal assistance Where Assessed-Lower Body Dressing: Standing at sink;Sitting at sink Toileting: Dependent Where Assessed-Toileting: Bedside Commode Toilet Transfer: Maximal assistance Toilet Transfer Method: Squat pivot Toilet Transfer Equipment: Psychiatric nurse: Maximal assistance Film/video editor Method: Warden/ranger: Information systems manager with back Vision/Perception  Vision - History Baseline Vision: Wears glasses only for reading Visual History: Macular degeneration Patient Visual Report: No change from baseline Vision - Assessment Eye Alignment: Within Functional Limits Praxis Praxis: Impaired Praxis Impairment Details: Motor planning  Cognition Overall Cognitive Status: Impaired Arousal/Alertness: Awake/alert Orientation Level: Oriented X4 Attention: Selective Focused Attention: Appears intact Sustained Attention: Appears intact Selective Attention: Appears intact Alternating Attention: Appears intact Memory: Impaired Memory Impairment: Decreased recall of new information;Decreased short term memory Awareness: Impaired Awareness Impairment: Emergent impairment;Anticipatory impairment Problem Solving: Impaired Problem Solving Impairment: Functional complex Executive Function:  Self Monitoring Self Monitoring: Impaired Self Monitoring Impairment: Functional complex;Verbal complex Safety/Judgment: Impaired [place]ant Scales of Cognitive Functioning: Purposeful/appropriate Sensation Sensation Light Touch: Impaired Detail Light Touch Impaired Details: Impaired RUE Proprioception: Impaired Detail Proprioception Impaired Details: Impaired RUE;Impaired RLE Coordination Gross Motor Movements are Fluid and Coordinated: No Fine Motor Movements are Fluid and Coordinated: No Motor  Motor Motor: Hemiplegia;Abnormal postural alignment and control;Abnormal tone;Motor apraxia;Motor impersistence Mobility    see FIM Trunk/Postural Assessment  Cervical Assessment Cervical  Assessment: Within Functional Limits Thoracic Assessment Thoracic Assessment: Within Functional Limits Lumbar Assessment Lumbar Assessment: Exceptions to [facility] (posterior tilt) Postural Control Postural Control: Deficits on evaluation (delayed balance reactions)  Balance Balance Balance Assessed: Yes Static Sitting Balance Static Sitting - Level of Assistance: 5: Stand by assistance Dynamic Sitting Balance Dynamic Sitting - Level of Assistance: 5: Stand by assistance Static Standing Balance Static Standing - Level of Assistance: 4: Min assist Dynamic Standing Balance Dynamic Standing - Level of Assistance: 4: Min assist Extremity/Trunk Assessment RUE Assessment RUE Assessment: Exceptions to [facility] RUE Tone RUE Tone Comments: Brunstrom III-IV LUE Assessment LUE Assessment: Within Functional Limits  See FIM for current functional status  Roney Mans [facility] 11/09/2012, 12:04 PM

## 2012-11-09 NOTE — Progress Notes (Signed)
Physical Therapy Note  Patient Details  Name: John Montgomery MRN: [id number] Date of Birth: 1920/12/30 Today's Date: 11/09/2012  Time: 830-930 60 minutes  No c/o pain. Gait training at L railing in hallway with mod A 2 x 40' with assist to advance R LE, manual facilitation for wt shifts and posture, verbal cues for sequencing.  Stair training x 1 step multiple attempts with max A due to pt pushing hard with L LE/UE when fatigued.  Standing and seated NMR with focus on equalizing wt bearing through B LEs and decreasing extensor tone and pushing.  Pt able to improve with repetition but little carry over between sessions.  Pt remains motivated and is making slow, steady progress.  Individual therapy  December Hedtke 11/09/2012, 11:30 AM

## 2012-11-09 NOTE — Progress Notes (Signed)
Speech Language Pathology Discharge Summary  Patient Details  Name: John Montgomery MRN: [id number] Date of Birth: [birth year]  Today's Date: 11/09/2012 Time: 0930-1000 Time Calculation (min): 30 min  Patient has met 3 of 3 long term goals.  Patient to discharge at overall Supervision level.   Reasons goals not met: N/A   Clinical Impression/Discharge Summary: Pt has made functional gains and has met 3 of 3 LTG's this reporting period. Currently, pt is demonstrating behaviors consistent with a Rancho Level VIII and requires overall supervision verbal cues for functional problem solving, emergent awareness, working memory with utilization of compensatory strategies and divided attention. Pt is also requires supervision verbal cues for utilization of decreased speech rate the sentence level. Both the pt and family report pt is at cognitive and speech baseline, however, pt continues to demonstrate decreased problem solving with complex tasks and emergent awareness, especially with mobility.  Recommend continued skilled SLP intervention to maximize cognitive recovery with higher-level tasks.  Care Partner:  Caregiver Able to Provide Assistance: No  Type of Caregiver Assistance: Physical;Cognitive  Recommendation:  24 hour supervision/assistance      Equipment: N/A   Reasons for discharge: Discharged from hospital;Treatment goals met   Patient/Family Agrees with Progress Made and Goals Achieved: Yes   See FIM for current functional status  [name] 11/09/2012, 4:46 PM

## 2012-11-09 NOTE — Progress Notes (Signed)
Report given to Venetia Maxon, LPN  At [facility]

## 2012-11-09 NOTE — Progress Notes (Signed)
Patient discharge to SNF([facility]) via stretcher transported by the EMS. Patient was alert and oriented, not in any distress prior to discharge.

## 2012-11-09 NOTE — Progress Notes (Signed)
Physical Therapy Discharge Summary  Patient Details  Name: John Montgomery MRN: [id number] Date of Birth: 1921/03/14  Today's Date: 11/09/2012  Patient has met 7 of 9 long term goals due to improved activity tolerance, improved balance, improved postural control, increased strength, functional use of  right upper extremity and right lower extremity, improved attention, improved awareness and improved coordination.  Patient to discharge at a wheelchair level Supervision.    Reasons goals not met: pt unable to gait with AD at this time, requires mod A for gait at railing on R,  Requires max A for stairs due to motor apraxia, increased tone R LE  Recommendation:  Patient will benefit from ongoing skilled PT services in skilled nursing facility setting to continue to advance safe functional mobility, address ongoing impairments in strength, coordination, gait, awareness, balance, and minimize fall risk.  Equipment: No equipment provided  Reasons for discharge: treatment goals met and discharge from hospital  Patient/family agrees with progress made and goals achieved: Yes  PT Discharge   Cognition Overall Cognitive Status: Impaired Orientation Level: Oriented X4 Focused Attention: Appears intact Sustained Attention: Appears intact Selective Attention: Appears intact Awareness: Impaired Awareness Impairment: Emergent impairment;Anticipatory impairment Safety/Judgment: Impaired Rancho Mirant Scales of Cognitive Functioning: Purposeful/appropriate Sensation Sensation Light Touch: Appears Intact Proprioception: Impaired Detail Proprioception Impaired Details: Impaired RLE;Impaired RUE Coordination Gross Motor Movements are Fluid and Coordinated: No Fine Motor Movements are Fluid and Coordinated: No Motor  Motor Motor: Hemiplegia;Abnormal postural alignment and control;Abnormal tone;Motor apraxia;Motor impersistence   Trunk/Postural Assessment  Cervical Assessment Cervical  Assessment: Within Functional Limits Thoracic Assessment Thoracic Assessment: Within Functional Limits Lumbar Assessment Lumbar Assessment: Exceptions to [facility] (posterior tilt) Postural Control Postural Control:  (delayed balance reactions)  Balance Static Sitting Balance Static Sitting - Level of Assistance: 5: Stand by assistance Dynamic Sitting Balance Dynamic Sitting - Level of Assistance: 4: Min assist Static Standing Balance Static Standing - Level of Assistance: 4: Min assist Dynamic Standing Balance Dynamic Standing - Level of Assistance: 4: Min assist Extremity Assessment      RLE Assessment RLE Assessment:  (ankle 0/5, knee ext 2/5, hip 2-/5) LLE Assessment LLE Assessment: Within Functional Limits  See FIM for current functional status  Jayro Mcmath 11/09/2012, 11:27 AM

## 2012-11-09 NOTE — Progress Notes (Signed)
Patient ID: John Montgomery, male   DOB: 1921-05-20, 78 y.o.   MRN: [id number] Subjective/Complaints: Moving R arm better still. Right leg lagging behind. Wearing prafo A 12 point review of systems has been performed and if not noted above is otherwise negative.  Objective: Vital Signs: Blood pressure 156/63, pulse 76, temperature 97.7 F (36.5 C), temperature source Oral, resp. rate 17, height 5\' 7"  (1.702 m), weight 52.436 kg (115 lb 9.6 oz), SpO2 96.00%. No results found. No results found for this basename: WBC:2,HGB:2,HCT:2,PLT:2 in the last 72 hours No results found for this basename: NA:2,K:2,CL:2,CO:2,GLUCOSE:2,BUN:2,CREATININE:2,CALCIUM:2 in the last 72 hours CBG (last 3)  No results found for this basename: GLUCAP:3 in the last 72 hours  Wt Readings from Last 3 Encounters:  11/08/12 52.436 kg (115 lb 9.6 oz)  10/18/12 55.1 kg (121 lb 7.6 oz)    Physical Exam:   Eyes: Pupils are equal, round, and reactive to light.  Neck: Normal range of motion.  Cardiovascular: Normal rate and regular rhythm.  Pulmonary/Chest: Effort normal and breath sounds normal.  Abdominal: Soft. Bowel sounds are normal.  Musculoskeletal: He exhibits no edema.  Neurological: He is alert and oriented to person, place, and time.  . Pleasant and appropriate. Follows commands without difficulty. Soft speech without dysarthria.+ Word finding deficits Right sided weakness with intermittent flexor spasms of RLE. RUE is 2/5 deltoid, 2 biceps, 2- triceps, grip 3-4/5. RLE is 1 at RHF,2- RKE and trace to 0/5 at ankle.   Senses pain throughout right side.right central 7 and some tongue deviation, but other CN normal. DTR's are 2+ to 3+ right elbow, knee, 1/4 resting tone right LE. Cognitively he displays good insight and awareness. Id's simple objects without issue. Occasionally struggles with longer phrases and sentences.  Skin: Skin is warm and dry.R base of 5th toe callus no erythema or swelling Psychiatric: He has a  normal mood and affect. His speech is normal. Thought content normal. His mood appears not anxious. Cognition and memory are normal.    Assessment/Plan: 1. 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 Physiatrist is providing close team supervision and 24 hour management of active medical problems listed below. Physiatrist and rehab team continue to assess barriers to discharge/monitor patient progress toward functional and medical goals.   for SNF today  FIM: FIM - Bathing Bathing Steps Patient Completed: Chest;Right Arm;Abdomen;Front perineal area;Right upper leg;Left upper leg;Left lower leg (including foot) Bathing: 3: Mod-Patient completes 5-7 1f 10 parts or 50-74%  FIM - Upper Body Dressing/Undressing Upper body dressing/undressing steps patient completed: Thread/unthread left sleeve of pullover shirt/dress;Put head through opening of pull over shirt/dress;Pull shirt over trunk Upper body dressing/undressing: 4: Min-Patient completed 75 plus % of tasks FIM - Lower Body Dressing/Undressing Lower body dressing/undressing steps patient completed: Thread/unthread left pants leg;Don/Doff left sock;Don/Doff left shoe Lower body dressing/undressing: 2: Max-Patient completed 25-49% of tasks  FIM - Toileting Toileting steps completed by patient: Adjust clothing prior to toileting Toileting Assistive Devices: Grab bar or rail for support Toileting: 1: Total-Patient completed zero steps, helper did all 3  FIM - Diplomatic Services operational officer Devices: Grab bars Toilet Transfers: 3-To toilet/BSC: Mod A (lift or lower assist);3-From toilet/BSC: Mod A (lift or lower assist)  FIM - Banker Devices: Orthosis Bed/Chair Transfer: 3: Supine > Sit: Mod A (lifting assist/Pt. 50-74%/lift 2 legs;3: Sit > Supine: Mod A (  lifting  assist/Pt. 50-74%/lift 2 legs);2: Bed > Chair or W/C: Max A (lift and lower assist);2: Chair or W/C > Bed: Max A (lift and lower assist);1: Two helpers  FIM - Locomotion: Wheelchair Distance: 150 Locomotion: Wheelchair: 5: Travels 150 ft or more: maneuvers on rugs and over door sills with supervision, cueing or coaxing FIM - Locomotion: Ambulation Locomotion: Ambulation Assistive Devices: Other (comment) (railing in hallway) Ambulation/Gait Assistance: 2: Max assist Locomotion: Ambulation: 1: Travels less than 50 ft with moderate assistance (Pt: 50 - 74%)  Comprehension Comprehension Mode: Auditory Comprehension: 6-Follows complex conversation/direction: With extra time/assistive device  Expression Expression Mode: Verbal Expression: 5-Expresses complex 90% of the time/cues < 10% of the time  Social Interaction Social Interaction: 5-Interacts appropriately 90% of the time - Needs monitoring or encouragement for participation or interaction.  Problem Solving Problem Solving: 4-Solves basic 75 - 89% of the time/requires cueing 10 - 24% of the time  Memory Memory: 4-Recognizes or recalls 75 - 89% of the time/requires cueing 10 - 24% of the time  Medical Problem List and Plan:  1. DVT Prophylaxis/Anticoagulation: Mechanical: Sequential compression devices, below knee Bilateral lower extremities  2. Pain Management: N/A  3. Mood: appropriate. Will monitor for now.   - neuropsych input and treatment appreciated 4. Neuropsych: This patient is capable of making decisions on his/her own behalf.  5. PAF: resolved. Continue low dose BB with close monitoring of HR for bradycardia as with tendency towards SSS. Keep K+ levels around 4.0.---4.1 most recently 6. HTN: Monitor with bid checks. Norvasc resumed systolic still elevated with increase dose.  7. ABLA: hgb remains around 11 8. RLE spasm: low dose baclofen prn has been helpful  -added prafo to control ankle at night-- . 9. UTI-  bactrim--complete today 10. Dry mouth: push fluids.Biotene  -bmet 12/20 looked normal 11. R 5th MTP pain and callus due to AFO, protective dressing LOS (Days) 20 A FACE TO FACE EVALUATION WAS PERFORMED  John Montgomery T 11/09/2012 7:14 AM

## 2012-11-09 NOTE — Progress Notes (Signed)
Social Work Discharge Note Discharge Note  The overall goal for the admission was met for:   Discharge location: [facility]  Length of Stay: Yes-20 DAYS  Discharge activity level: Yes-MIN LEVEL  Home/community participation: Yes  Services provided included: MD, RD, PT, OT, SLP, RN, TR, Pharmacy and SW  Financial Services: Medicare  Follow-up services arranged: Other: [facility]  Comments (or additional information):  Patient/Family verbalized understanding of follow-up arrangements: Yes  Individual responsible for coordination of the follow-up plan: SELF & DAUGHTER  Confirmed correct DME delivered: Lucy Chris 11/09/2012    Lucy Chris

## 2012-12-05 ENCOUNTER — Encounter: Payer: Medicare Other | Attending: Physical Medicine & Rehabilitation | Admitting: Physical Medicine & Rehabilitation

## 2012-12-05 ENCOUNTER — Encounter: Payer: Self-pay | Admitting: Physical Medicine & Rehabilitation

## 2012-12-05 VITALS — BP 154/47 | HR 93 | Resp 14 | Wt 112.0 lb

## 2012-12-05 DIAGNOSIS — I6200 Nontraumatic subdural hemorrhage, unspecified: Secondary | ICD-10-CM

## 2012-12-05 DIAGNOSIS — I619 Nontraumatic intracerebral hemorrhage, unspecified: Secondary | ICD-10-CM | POA: Insufficient documentation

## 2012-12-05 DIAGNOSIS — S065X9A Traumatic subdural hemorrhage with loss of consciousness of unspecified duration, initial encounter: Secondary | ICD-10-CM

## 2012-12-05 DIAGNOSIS — I10 Essential (primary) hypertension: Secondary | ICD-10-CM

## 2012-12-05 DIAGNOSIS — I4891 Unspecified atrial fibrillation: Secondary | ICD-10-CM

## 2012-12-05 DIAGNOSIS — I480 Paroxysmal atrial fibrillation: Secondary | ICD-10-CM

## 2012-12-05 NOTE — Patient Instructions (Addendum)
1. Continue with strength, gait training, neuromuscular re-education with PT and OT  2. I would like Mr. Sanon to be fitted with a right AFO, potentially a Stage manager brace. Please consult [facility], [phone]) for measuring, fit.  3. Please give Mr. Gehres instructions on regular stretching for his right leg (quads, hamstring, gastrocs) to do at night before he goes to bed.   4. Follow up with me in about 6 weeks here in my office   CALL ME WITH ANY QUESTIONS  [phone]

## 2012-12-05 NOTE — Progress Notes (Signed)
Subjective:    Patient ID: John Montgomery, male    DOB: 1921/02/23, 79 y.o.   MRN: [id number]  HPI  John Montgomery is here in follow up of his left SDH. He has been over at [facility] He has been working on balance, strength, and mobility. For the most part he has been making progress. He fell while transferring out of bed and bumped his head against the bed rail.  He still notes weakness in his right leg while his right arm has continued to improve. He has just started to work more on standing and walking. His spasms for the most part are showing signs of improvement. He tends to have them more at night. Stretching seems to work for the spasms generally. Sometimes he will use a baclofen.   He is not using an ankle brace currently. We sent him to [facility] place with a PRAFO.  Pain Inventory Average Pain 4 Pain Right Now 0 My pain is spasm  In the last 24 hours, has pain interfered with the following? General activity 0 Relation with others 0 Enjoyment of life 0 What TIME of day is your pain at its worst? daytime Sleep (in general) Fair  Pain is worse with: sitting Pain improves with: rest Relief from Meds: 8  Mobility walk with assistance use a wheelchair needs help with transfers  Function disabled: date disabled  I need assistance with the following:  dressing, bathing, toileting, meal prep, household duties and shopping  Neuro/Psych spasms  Prior Studies Any changes since last visit?  no  Physicians involved in your care Any changes since last visit?  no   History reviewed. No pertinent family history. History   Social History  . Marital Status: Widowed    Spouse Name: N/A    Number of Children: N/A  . Years of Education: N/A   Social History Main Topics  . Smoking status: Never Smoker   . Smokeless tobacco: Never Used  . Alcohol Use: No  . Drug Use: No  . Sexually Active:    Other Topics Concern  . None   Social History Narrative  . None   Past  Surgical History  Procedure Date  . Angioplasty    Past Medical History  Diagnosis Date  . Hypertension   . Hyperlipidemia   . Myocardial infarction    BP 154/47  Pulse 93  Resp 14  Wt 112 lb (50.803 kg)  SpO2 98%   Review of Systems  Musculoskeletal:       Spasms   Hematological: Bruises/bleeds easily.  All other systems reviewed and are negative.       Objective:   Physical Exam  General: Alert and oriented x 3, No apparent distress HEENT: Head is normocephalic, atraumatic, PERRLA, EOMI, sclera anicteric, oral mucosa pink and moist, dentition intact, ext ear canals clear,  Neck: Supple without JVD or lymphadenopathy Heart: Reg rate and rhythm. No murmurs rubs or gallops Chest: CTA bilaterally without wheezes, rales, or rhonchi; no distress Abdomen: Soft, non-tender, non-distended, bowel sounds positive. Extremities: No clubbing, cyanosis, or edema. Pulses are 2+ Skin: Clean and intact without signs of breakdown Neuro: Pt has reasonable insight and awareness. Remains distractible but can be redirected. No resting tone. RUE is 3+/5 for deltoid, biceps/tricep 4-, grip is 4+, HF is 3+, KE 3+, KF 2, ADF and APF are absent. Sensation is 2/2. Motor planning issues still. No focal CN deficits.  Musculoskeletal: Full ROM, No pain with AROM or PROM in  the neck, trunk, or extremities. Posture appropriate Psych: Pt's affect is appropriate. Pt is cooperative         Assessment & Plan:   1. Right frontal lobe intracerebral hemorrhage.  2. Paroxysmal atrial fibrillation.  3. Hypertension.   Plan: 1. Continue with strength, gait training, neuromuscular re-education with PT and OT 2. I would like John Montgomery to be fitted with a right AFO, potentially a Stage manager brace. Please consult [facility], [phone] for measuring, fit. 3. Please give John Montgomery instructions on regular stretching for his right leg (quads, hamstring, gastrocs) to do at night before he goes to  bed.  4. Follow up with me in about 6 weeks here in my office

## 2012-12-19 ENCOUNTER — Other Ambulatory Visit: Payer: Self-pay | Admitting: Neurosurgery

## 2012-12-19 DIAGNOSIS — I609 Nontraumatic subarachnoid hemorrhage, unspecified: Secondary | ICD-10-CM

## 2012-12-25 ENCOUNTER — Ambulatory Visit
Admission: RE | Admit: 2012-12-25 | Discharge: 2012-12-25 | Disposition: A | Discharge status: Home / Self Care | Payer: Medicare Other | Source: Ambulatory Visit | Attending: Neurosurgery | Admitting: Neurosurgery

## 2012-12-25 DIAGNOSIS — I609 Nontraumatic subarachnoid hemorrhage, unspecified: Secondary | ICD-10-CM

## 2012-12-27 ENCOUNTER — Other Ambulatory Visit: Payer: Medicare Other

## 2013-01-19 ENCOUNTER — Encounter: Payer: Medicare Other | Admitting: Physical Medicine & Rehabilitation

## 2013-02-06 ENCOUNTER — Encounter: Payer: Self-pay | Admitting: Physical Medicine & Rehabilitation

## 2013-02-06 ENCOUNTER — Encounter: Payer: Medicare Other | Attending: Physical Medicine & Rehabilitation | Admitting: Physical Medicine & Rehabilitation

## 2013-02-06 VITALS — BP 139/58 | HR 65 | Resp 16 | Ht 67.0 in | Wt 125.0 lb

## 2013-02-06 DIAGNOSIS — S065X9A Traumatic subdural hemorrhage with loss of consciousness of unspecified duration, initial encounter: Secondary | ICD-10-CM

## 2013-02-06 DIAGNOSIS — I619 Nontraumatic intracerebral hemorrhage, unspecified: Secondary | ICD-10-CM | POA: Insufficient documentation

## 2013-02-06 DIAGNOSIS — I10 Essential (primary) hypertension: Secondary | ICD-10-CM | POA: Insufficient documentation

## 2013-02-06 DIAGNOSIS — I4891 Unspecified atrial fibrillation: Secondary | ICD-10-CM | POA: Insufficient documentation

## 2013-02-06 DIAGNOSIS — I6200 Nontraumatic subdural hemorrhage, unspecified: Secondary | ICD-10-CM

## 2013-02-06 NOTE — Patient Instructions (Signed)
1. PLEASE TRY WEIGHTED UTENCILS WITH MR. Apsey TO SEE IF THIS HELPS WITH TREMORS 2. PLEASE SET UP WITH HOME HEALTH THERAPY ONCE DISCHARGED. I WILL ARRANGE OUTPT THERAPIES WHEN HE'S READY.

## 2013-02-06 NOTE — Progress Notes (Signed)
Subjective:    Patient ID: John Montgomery, male    DOB: 1921/02/19, 79 y.o.   MRN: [id number]  HPI  Mr. Shellhammer is back regarding his left frontal ICH. He remains at the SNF. They are working on his gait and stability and strengthening. His gait length and distances have increased. They are using an off the shelf brace for his right ankle. He walks with a walker typically, but they are also walking with hand held assist.   He has had some tremor still in the right arm which he notices with self care and feeding.    He is in no pain.  Pain Inventory Average Pain 0 Pain Right Now 0 My pain is n/a  In the last 24 hours, has pain interfered with the following? General activity n/a Relation with others n/a Enjoyment of life n/a What TIME of day is your pain at its worst? n/a Sleep (in general) n/a  Pain is worse with: n/a Pain improves with: n/a Relief from Meds: n/a  Mobility walk with assistance use a walker ability to climb steps?  no do you drive?  no use a wheelchair needs help with transfers  Function retired I need assistance with the following:  dressing and bathing  Neuro/Psych tremor spasms  Prior Studies Any changes since last visit?  no  Physicians involved in your care Any changes since last visit?  no   History reviewed. No pertinent family history. History   Social History  . Marital Status: Widowed    Spouse Name: N/A    Number of Children: N/A  . Years of Education: N/A   Social History Main Topics  . Smoking status: Never Smoker   . Smokeless tobacco: Never Used  . Alcohol Use: No  . Drug Use: No  . Sexually Active:    Other Topics Concern  . None   Social History Narrative  . None   Past Surgical History  Procedure Laterality Date  . Angioplasty     Past Medical History  Diagnosis Date  . Hypertension   . Hyperlipidemia   . Myocardial infarction    BP 139/58  Pulse 65  Resp 16  Ht 5\' 7"  (1.702 m)  Wt 125 lb (56.7 kg)   BMI 19.57 kg/m2  SpO2 98%      Review of Systems  Neurological: Positive for tremors.       Spasms    All other systems reviewed and are negative.       Objective:   Physical Exam General: Alert and oriented x 3, No apparent distress  HEENT: Head is normocephalic, atraumatic, PERRLA, EOMI, sclera anicteric, oral mucosa pink and moist, dentition intact, ext ear canals clear,  Neck: Supple without JVD or lymphadenopathy  Heart: Reg rate and rhythm. No murmurs rubs or gallops  Chest: CTA bilaterally without wheezes, rales, or rhonchi; no distress  Abdomen: Soft, non-tender, non-distended, bowel sounds positive.  Extremities: No clubbing, cyanosis, or edema. Pulses are 2+  Skin: Clean and intact without signs of breakdown  Neuro: Pt has reasonable insight and awareness. Remains distractible but can be redirected. No resting tone. RUE is 4/5 for deltoid, biceps/tricep 4+, grip is 4+, HF is 4, KE 4, KF 3, ADF and APF are absent. Sensation is 2/2. Motor planning issues still. No focal CN deficits. Tremor in right upper ext with volitional movement Musculoskeletal: Full ROM, No pain with AROM or PROM in the neck, trunk, or extremities. Posture appropriate. Right heel cord a  little tight Psych: Pt's affect is appropriate. Pt is cooperative   Assessment & Plan:   1. left frontal lobe intracerebral hemorrhage.  2. Paroxysmal atrial fibrillation.  3. Hypertension.   Plan:  1. Continue with strength, gait training, neuromuscular re-education with PT and OT. Needs to stretch right heel cord . Discussed the use of weighted utencils to help with tremors 2. I provided an rx for a right AFO, potentially a Stage manager brace.  [facility], [phone] for measuring, fit once leaving SNF 3. Would recommend starting with home health therapies and we'll transition to outpt 4. Follow up with me in about  2months.  here in my office. 15 minutes of face to face patient care time were spent  during this visit. All questions were encouraged and answered.

## 2013-02-09 ENCOUNTER — Non-Acute Institutional Stay (SKILLED_NURSING_FACILITY): Payer: Medicare Other | Admitting: Internal Medicine

## 2013-02-09 DIAGNOSIS — I10 Essential (primary) hypertension: Secondary | ICD-10-CM

## 2013-02-09 DIAGNOSIS — I2510 Atherosclerotic heart disease of native coronary artery without angina pectoris: Secondary | ICD-10-CM

## 2013-02-09 DIAGNOSIS — E876 Hypokalemia: Secondary | ICD-10-CM

## 2013-02-09 DIAGNOSIS — E7800 Pure hypercholesterolemia, unspecified: Secondary | ICD-10-CM

## 2013-02-16 ENCOUNTER — Non-Acute Institutional Stay (SKILLED_NURSING_FACILITY): Payer: Medicare Other | Admitting: Adult Health

## 2013-02-16 DIAGNOSIS — M62838 Other muscle spasm: Secondary | ICD-10-CM

## 2013-02-16 DIAGNOSIS — K5900 Constipation, unspecified: Secondary | ICD-10-CM

## 2013-02-16 DIAGNOSIS — S065X9A Traumatic subdural hemorrhage with loss of consciousness of unspecified duration, initial encounter: Secondary | ICD-10-CM

## 2013-02-16 DIAGNOSIS — I6200 Nontraumatic subdural hemorrhage, unspecified: Secondary | ICD-10-CM

## 2013-02-16 DIAGNOSIS — I10 Essential (primary) hypertension: Secondary | ICD-10-CM

## 2013-02-16 DIAGNOSIS — K219 Gastro-esophageal reflux disease without esophagitis: Secondary | ICD-10-CM

## 2013-02-16 DIAGNOSIS — G4700 Insomnia, unspecified: Secondary | ICD-10-CM

## 2013-02-16 DIAGNOSIS — I4891 Unspecified atrial fibrillation: Secondary | ICD-10-CM

## 2013-02-16 DIAGNOSIS — I480 Paroxysmal atrial fibrillation: Secondary | ICD-10-CM

## 2013-02-16 DIAGNOSIS — I2510 Atherosclerotic heart disease of native coronary artery without angina pectoris: Secondary | ICD-10-CM

## 2013-02-16 DIAGNOSIS — E785 Hyperlipidemia, unspecified: Secondary | ICD-10-CM

## 2013-02-16 NOTE — Progress Notes (Signed)
Patient ID: John Montgomery, male   DOB: 09-10-1921, 78 y.o.   MRN: [id number]        PROGRESS NOTE  DATE:   02/09/2013   FACILITY:  [facility]   LEVEL OF CARE: SNF  Routine Visit  CHIEF COMPLAINT:  Manage coronary artery disease and hypertension.    HISTORY OF PRESENT ILLNESS:  REASSESSMENT OF ONGOING PROBLEM(S):  HTN: Pt 's HTN remains stable.  Denies CP, sob, DOE, pedal edema, headaches, dizziness or visual disturbances.  No complications from the medications currently being used.  Last BP :  139/59, 150/76.  CAD: The angina has been stable. The patient denies dyspnea on exertion, orthopnea, pedal edema, palpitations and paroxysmal nocturnal dyspnea. No complications noted from the medication presently being used.   PAST MEDICAL HISTORY : Reviewed.  No changes.  CURRENT MEDICATIONS: Reviewed per [facility]  REVIEW OF SYSTEMS:  GENERAL: no change in appetite, no fatigue, no weight changes, no fever, chills or weakness RESPIRATORY: no cough, SOB, DOE, wheezing, hemoptysis CARDIAC: no chest pain, edema or palpitations GI: no abdominal pain, diarrhea, constipation, heart burn, nausea or vomiting  PHYSICAL EXAMINATION  VS:  T 97.8      P 74     RR 18     BP 139/59    POX %  97 room air   WT (Lb)  GENERAL: no acute distress, normal body habitus RESPIRATORY: breathing is even & unlabored, BS CTAB CARDIAC: RRR, no murmur,no extra heart sounds, no edema GI: abdomen soft, normal BS, no masses, no tenderness, no hepatomegaly, no splenomegaly PSYCHIATRIC: the patient is alert & oriented to person, affect & behavior appropriate  LABS/RADIOLOGY: 12/2012:  Potassium 2.7.  10/2012:  Hemoglobin 11.9, MCV 91.4, otherwise CBC normal.    CMP normal.    Fasting lipid panel normal.    ASSESSMENT/PLAN:  Hypertension.  Well controlled.    Coronary artery disease.  Stable.   Hyperlipidemia.  Well controlled.    Hypokalemia.  Well controlled.    Constipation.  Well controlled.     GERD.  Well controlled.    Insomnia.  Denies any symptoms.    CPT CODE: 40981.

## 2013-02-19 DIAGNOSIS — R269 Unspecified abnormalities of gait and mobility: Secondary | ICD-10-CM

## 2013-02-19 DIAGNOSIS — N390 Urinary tract infection, site not specified: Secondary | ICD-10-CM

## 2013-02-19 DIAGNOSIS — M6281 Muscle weakness (generalized): Secondary | ICD-10-CM

## 2013-02-28 ENCOUNTER — Encounter (HOSPITAL_COMMUNITY): Payer: Self-pay | Admitting: *Deleted

## 2013-02-28 ENCOUNTER — Emergency Department (HOSPITAL_COMMUNITY): Payer: Medicare Other

## 2013-02-28 ENCOUNTER — Emergency Department (HOSPITAL_COMMUNITY)
Admission: EM | Admit: 2013-02-28 | Discharge: 2013-02-28 | Disposition: A | Discharge status: Home / Self Care | Payer: Medicare Other | Attending: Emergency Medicine | Admitting: Emergency Medicine

## 2013-02-28 ENCOUNTER — Encounter: Payer: Self-pay | Admitting: Adult Health

## 2013-02-28 DIAGNOSIS — Z880 Allergy status to penicillin: Secondary | ICD-10-CM | POA: Insufficient documentation

## 2013-02-28 DIAGNOSIS — Z23 Encounter for immunization: Secondary | ICD-10-CM | POA: Insufficient documentation

## 2013-02-28 DIAGNOSIS — S1093XA Contusion of unspecified part of neck, initial encounter: Secondary | ICD-10-CM | POA: Insufficient documentation

## 2013-02-28 DIAGNOSIS — S12500A Unspecified displaced fracture of sixth cervical vertebra, initial encounter for closed fracture: Secondary | ICD-10-CM | POA: Insufficient documentation

## 2013-02-28 DIAGNOSIS — W19XXXA Unspecified fall, initial encounter: Secondary | ICD-10-CM

## 2013-02-28 DIAGNOSIS — G8929 Other chronic pain: Secondary | ICD-10-CM | POA: Insufficient documentation

## 2013-02-28 DIAGNOSIS — S0990XA Unspecified injury of head, initial encounter: Secondary | ICD-10-CM | POA: Insufficient documentation

## 2013-02-28 DIAGNOSIS — Z8679 Personal history of other diseases of the circulatory system: Secondary | ICD-10-CM

## 2013-02-28 DIAGNOSIS — I252 Old myocardial infarction: Secondary | ICD-10-CM | POA: Insufficient documentation

## 2013-02-28 DIAGNOSIS — I10 Essential (primary) hypertension: Secondary | ICD-10-CM | POA: Insufficient documentation

## 2013-02-28 DIAGNOSIS — S0003XA Contusion of scalp, initial encounter: Secondary | ICD-10-CM | POA: Insufficient documentation

## 2013-02-28 DIAGNOSIS — Y9389 Activity, other specified: Secondary | ICD-10-CM | POA: Insufficient documentation

## 2013-02-28 DIAGNOSIS — S129XXA Fracture of neck, unspecified, initial encounter: Secondary | ICD-10-CM

## 2013-02-28 DIAGNOSIS — IMO0002 Reserved for concepts with insufficient information to code with codable children: Secondary | ICD-10-CM | POA: Insufficient documentation

## 2013-02-28 DIAGNOSIS — Y92009 Unspecified place in unspecified non-institutional (private) residence as the place of occurrence of the external cause: Secondary | ICD-10-CM | POA: Insufficient documentation

## 2013-02-28 DIAGNOSIS — Z79899 Other long term (current) drug therapy: Secondary | ICD-10-CM | POA: Insufficient documentation

## 2013-02-28 DIAGNOSIS — E785 Hyperlipidemia, unspecified: Secondary | ICD-10-CM | POA: Insufficient documentation

## 2013-02-28 DIAGNOSIS — M549 Dorsalgia, unspecified: Secondary | ICD-10-CM | POA: Insufficient documentation

## 2013-02-28 DIAGNOSIS — W06XXXA Fall from bed, initial encounter: Secondary | ICD-10-CM | POA: Insufficient documentation

## 2013-02-28 MED ORDER — TETANUS-DIPHTH-ACELL PERTUSSIS 5-2.5-18.5 LF-MCG/0.5 IM SUSP
0.5000 mL | Freq: Once | INTRAMUSCULAR | Status: AC
  Administered 2013-02-28: 0.5 mL via INTRAMUSCULAR
  Filled 2013-02-28: qty 0.5

## 2013-02-28 NOTE — ED Notes (Signed)
PA at bedside.

## 2013-02-28 NOTE — ED Notes (Signed)
Patient transported to CT 

## 2013-02-28 NOTE — ED Provider Notes (Signed)
Medical screening examination/treatment/procedure(s) were conducted as a shared visit with non-physician practitioner(s) and myself.  I personally evaluated the patient during the encounter  John Montgomery is a 79 y.o. male here s/p fall. CT head neg, CT cervical spine showed C6 unilateral facet fracture. I called Dr. Yetta Barre from neurosurgery, who reviewed the CT and recommend aspen collar and outpatient f/u.    Richardean Canal, MD 02/28/13 [phone]

## 2013-02-28 NOTE — Progress Notes (Signed)
  Subjective:    Patient ID: John Montgomery, male    DOB: Apr 02, 1921, 79 y.o.   MRN: [id number]  HPI This is an 79 year old male who is for discharge home with Home health PT, OT, Nursing and Nursing. DME: Rolling walker, wheelchair and 3-in-1 commode.DME form completed.  He has been admitted to [facility] on 11/09/13 from [facility]. He was knocked over by a neighbor's dog and was diagnosed with right frontal lobe intracerebral hemorrhage   Review of Systems  Constitutional: Negative.   HENT: Negative.   Eyes: Negative.   Respiratory: Positive for cough. Negative for chest tightness.   Cardiovascular: Negative for leg swelling.  Gastrointestinal: Negative.   Genitourinary: Negative.   Neurological: Negative for dizziness and headaches.  Hematological: Negative for adenopathy. Does not bruise/bleed easily.  Psychiatric/Behavioral: Negative.        Objective:   Physical Exam  Constitutional: He is oriented to person, place, and time. He appears well-developed and well-nourished.  HENT:  Head: Normocephalic.  Right Ear: External ear normal.  Left Ear: External ear normal.  Eyes: Conjunctivae are normal. Pupils are equal, round, and reactive to light.  Neck: Normal range of motion. Neck supple.  Cardiovascular: Normal rate, regular rhythm and normal heart sounds.   Pulmonary/Chest: Effort normal and breath sounds normal.  Abdominal: Soft.  Musculoskeletal: He exhibits no edema and no tenderness.  Neurological: He is alert and oriented to person, place, and time.  Skin: Skin is warm and dry.  Psychiatric: He has a normal mood and affect. His behavior is normal. Judgment and thought content normal.   LABS: 2/14  K 3.7   1/14  Bmp nl except K 3.3 12/13  Wbc 6.7  hgb 11.9  hct 35.0  Lipid profile nl       Assessment & Plan:  PAF (paroxysmal atrial fibrillation) - stable  SDH (subdural hematoma) - stable  HTN (hypertension) - well-controlled  Dyslipidemia -  stable  Hypokalemia - stable  Constipation - no complaints  Insomnia - no complaints  Muscle Spasm - stable

## 2013-02-28 NOTE — ED Notes (Signed)
Pt states he recently got out of rehab d/t a brain bleed and numbness in R leg d/t that, this morning cell phone fell in floor bent down out of bed to get cell phone and hit his head on the floor or walker, pt states he's unsure, Pt has bandage to R side of head. Denies headache, dizziness or blurred vision, states area to forehead is sore. Pt's family states he is no longer on blood thinners but was before his previous brain injury.

## 2013-02-28 NOTE — ED Notes (Signed)
Patient states that he was trying to pick his cell phone off the floor and hit his head. Denies LOC. Patient has a golf-ball size hematoma to his right temporal area; bleeding controlled. Patient states that he is NOT currently on blood thinners.

## 2013-02-28 NOTE — ED Notes (Signed)
Pt escorted to discharge window. Verbalized understanding discharge instructions. In no acute distress.   

## 2013-02-28 NOTE — ED Provider Notes (Signed)
History     CSN: [id number]  Arrival date & time 02/28/13  4098   First MD Initiated Contact with Patient 02/28/13 [phone]      Chief Complaint  Patient presents with  . Fall  . Head Injury    (Consider location/radiation/quality/duration/timing/severity/associated sxs/prior treatment) HPI Comments: Patient is an 79 y/o M with PMHx of HTN, Hyperlipidemia, MI presenting to the ED after a fall this morning. Patient stated that his cellphone fell on the floor while he was in bed, patient tried to pick the phone up from the floor and ended up falling out of bed, hitting his head on the wheelchair that was next to his bed and the floor. Patient stated that he has a "bruise" on his head that is sore when he touches it and he explained that he landed on his right side which is mildly sore as well. Patient denied being on blood thinners. Denied dizziness, headaches, blurry vision, visual distortions, LOC, loss of vision, amnesia, neck pain, syncope, gi symptoms, urinary symptoms.   Reviewed patient's chart and as per patient: Patient had a fall in December 2013, reported that his dog knocked him on the pavement. Chart reported that patient had a posterior left frontal lobe parenchymal hemorrhage. Patient reported that the injury left him with right sided weakness. Patient reported that he was admitted to rehab in December 2013 and was released from rehab In early April 2014 - reported that right sided weakness has improved. Patient reported that he still has right leg spasms. Prior to injury patient was on anticoagulation therapy.    The history is provided by the patient. No language interpreter was used.    Past Medical History  Diagnosis Date  . Hypertension   . Hyperlipidemia   . Myocardial infarction     Past Surgical History  Procedure Laterality Date  . Angioplasty      No family history on file.  History  Substance Use Topics  . Smoking status: Never Smoker   . Smokeless tobacco:  Never Used  . Alcohol Use: No      Review of Systems  Constitutional: Negative for fever, chills and fatigue.  HENT: Negative for ear pain, sore throat, trouble swallowing, neck pain and neck stiffness.   Eyes: Negative for pain and visual disturbance.  Respiratory: Negative for chest tightness and shortness of breath.   Cardiovascular: Negative for chest pain and leg swelling.  Gastrointestinal: Negative for nausea, vomiting, abdominal pain, diarrhea and constipation.  Genitourinary: Negative for decreased urine volume and difficulty urinating.  Musculoskeletal: Positive for back pain.       Chronic back pain  Skin: Positive for wound. Negative for rash.       Hematoma to forehead Abrasions to right mandible   Neurological: Negative for dizziness, syncope, weakness, light-headedness, numbness and headaches.       Fall  All other systems reviewed and are negative.    Allergies  Penicillins  Home Medications   Current Outpatient Rx  Name  Route  Sig  Dispense  Refill  . acetaminophen (TYLENOL) 325 MG tablet   Oral   Take 325 mg by mouth every 4 (four) hours as needed.         Marland Kitchen amLODipine (NORVASC) 5 MG tablet   Oral   Take 5 mg by mouth daily.         Marland Kitchen atorvastatin (LIPITOR) 20 MG tablet   Oral   Take 20 mg by mouth daily.         Marland Kitchen  baclofen (LIORESAL) 10 MG tablet   Oral   Take 5 mg by mouth at bedtime.          . metoprolol tartrate (LOPRESSOR) 25 MG tablet   Oral   Take 25 mg by mouth daily.         . metoprolol tartrate (LOPRESSOR) 25 MG tablet   Oral   Take 25 mg by mouth 2 (two) times daily.         . pantoprazole (PROTONIX) 40 MG tablet   Oral   Take 40 mg by mouth daily.         . potassium chloride SA (K-DUR,KLOR-CON) 20 MEQ tablet   Oral   Take 20 mEq by mouth daily.         Bernadette Hoit Sodium (SENOKOT S PO)   Oral   Take 1 tablet by mouth 2 (two) times daily.         . traZODone (DESYREL) 50 MG tablet    Oral   Take 50 mg by mouth at bedtime.           BP 162/63  Pulse 91  Temp(Src) 97.4 F (36.3 C) (Oral)  Resp 12  SpO2 100%  Physical Exam  Nursing note and vitals reviewed. Constitutional: He is oriented to person, place, and time. He appears well-developed and well-nourished. No distress.  HENT:  Head: Normocephalic.  Mouth/Throat: Oropharynx is clear and moist. No oropharyngeal exudate.  Mild soreness upon palpation to abrasion.   Eyes: Conjunctivae and EOM are normal. Pupils are equal, round, and reactive to light. Right eye exhibits no discharge. Left eye exhibits no discharge.  Neck: Normal range of motion. Neck supple. No tracheal deviation present. No thyromegaly present.  Negative lymphadenopathy  Cardiovascular: Normal rate, regular rhythm, normal heart sounds and intact distal pulses.  Exam reveals no friction rub.   No murmur heard. Negative leg swelling Negative pitting edema Radial pulses 2+ bilaterally Pedal pulses 2+ bilaterally  Pulmonary/Chest: Effort normal and breath sounds normal. No respiratory distress. He has no wheezes. He has no rales.  Musculoskeletal: Normal range of motion. He exhibits no edema and no tenderness.  Full ROM upper and lower extremities bilaterally Strength 5+/5+ to upper extremities bilaterally Strength 5+/5+ left lower extremity Strength 4+/5+ right lower extremity - has been mild weakness since hemorrhage in December 2013.  Lymphadenopathy:    He has no cervical adenopathy.  Neurological: He is alert and oriented to person, place, and time. No cranial nerve deficit. He exhibits normal muscle tone. Coordination normal.  Skin: Skin is warm and dry. No rash noted. He is not diaphoretic. No erythema.  Quarter sized hematoma to right aspect of forehead, near hairline.  Small abrasions located on right mandible.   Psychiatric: He has a normal mood and affect. His behavior is normal. Thought content normal.    ED Course  Procedures  (including critical care time)  Labs Reviewed - No data to display Ct Head Wo Contrast  02/28/2013  *RADIOLOGY REPORT*  Clinical Data:  Fall.  Right frontal abrasion.  History of intracranial hemorrhage with right-sided weakness.  CT HEAD WITHOUT CONTRAST CT CERVICAL SPINE WITHOUT CONTRAST  Technique:  Multidetector CT imaging of the head and cervical spine was performed following the standard protocol without intravenous contrast.  Multiplanar CT image reconstructions of the cervical spine were also generated.  Comparison:  Head CT 12/25/2012.  CT of the head and cervical spine 10/16/2012.  CT HEAD  Findings: There is stable asymmetric  encephalomalacia in the left periventricular white matter related to the previously demonstrated intraparenchymal hematoma.  There is no evidence of acute intracranial hemorrhage, mass lesion, brain edema or extra-axial fluid collection.  Mild atrophy and mild chronic small vessel ischemic changes are stable.  There is no hydrocephalus.  The visualized paranasal sinuses, mastoid air cells and middle ears are clear.  There is no evidence of acute calvarial fracture.  IMPRESSION: Stable chronic small vessel ischemic changes and asymmetric periventricular encephalomalacia on the left.  No acute intracranial findings.  CT CERVICAL SPINE  Findings: There is suspicion of a nondisplaced fracture involving the right superior articulating facet at C6.  This is best seen on sagittal images 10 and 11 and appears new.  No other fractures are identified.  The overall alignment is stable.  At both C4-C5 and C5- C6, there is a mild degenerative anterolisthesis.  The advanced disc space loss at C6-C7 appear stable.  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  No paraspinal or significant epidural hematoma is identified. There is extensive atherosclerosis of the carotid arteries bilaterally.  Chronic scarring in both lung apices appears grossly unchanged.  IMPRESSION:  1.  Nondisplaced  fracture of the right C6 articulating facet adjacent to the right C5-C6 facet joint. 2.  No other fractures identified.  The overall spinal alignment appears stable.  Critical Value/emergent results were called by telephone at the time of interpretation on 02/28/2013 at 0812 hours to Dr. Silverio Lay, who verbally acknowledged these results.   Original Report Authenticated By: Carey Bullocks, M.D.    Ct Cervical Spine Wo Contrast  02/28/2013  *RADIOLOGY REPORT*  Clinical Data:  Fall.  Right frontal abrasion.  History of intracranial hemorrhage with right-sided weakness.  CT HEAD WITHOUT CONTRAST CT CERVICAL SPINE WITHOUT CONTRAST  Technique:  Multidetector CT imaging of the head and cervical spine was performed following the standard protocol without intravenous contrast.  Multiplanar CT image reconstructions of the cervical spine were also generated.  Comparison:  Head CT 12/25/2012.  CT of the head and cervical spine 10/16/2012.  CT HEAD  Findings: There is stable asymmetric encephalomalacia in the left periventricular white matter related to the previously demonstrated intraparenchymal hematoma.  There is no evidence of acute intracranial hemorrhage, mass lesion, brain edema or extra-axial fluid collection.  Mild atrophy and mild chronic small vessel ischemic changes are stable.  There is no hydrocephalus.  The visualized paranasal sinuses, mastoid air cells and middle ears are clear.  There is no evidence of acute calvarial fracture.  IMPRESSION: Stable chronic small vessel ischemic changes and asymmetric periventricular encephalomalacia on the left.  No acute intracranial findings.  CT CERVICAL SPINE  Findings: There is suspicion of a nondisplaced fracture involving the right superior articulating facet at C6.  This is best seen on sagittal images 10 and 11 and appears new.  No other fractures are identified.  The overall alignment is stable.  At both C4-C5 and C5- C6, there is a mild degenerative anterolisthesis.   The advanced disc space loss at C6-C7 appear stable.  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  No paraspinal or significant epidural hematoma is identified. There is extensive atherosclerosis of the carotid arteries bilaterally.  Chronic scarring in both lung apices appears grossly unchanged.  IMPRESSION:  1.  Nondisplaced fracture of the right C6 articulating facet adjacent to the right C5-C6 facet joint. 2.  No other fractures identified.  The overall spinal alignment appears stable.  Critical Value/emergent results were called  by telephone at the time of interpretation on 02/28/2013 at 0812 hours to Dr. Silverio Lay, who verbally acknowledged these results.   Original Report Authenticated By: Carey Bullocks, M.D.      1. Cervical spine fracture, initial encounter   2. Fall, initial encounter   3. Head injury, initial encounter   4. History of cerebral parenchymal hemorrhage       MDM  Patient afebrile, normotensive, non-tachycardic, alert and oriented. I personally examined and evaluated the patient. Patient is complete aware with what happened, knows where is he is, knows what is going on. Patient alert. Denied LOC, syncope, dizziness, amnesia. Nurse cleaned wound site. CT head negative findings, no acute intracranial injuries noted - ischemic changes noted from pervious intracranial bleed from December 2013. CT of cervical spine: non-displaced fracture of right C6 noted - cervical hard collar placed on patient. Wound on forehead cleaned by nurse, bacitracin and bandage applied. Discussed case with Dr. Silverio Lay. Dr Silverio Lay spoke with neurosurgeon, Dr. Yetta Barre - recommended patient to stay in collar brace, to be discharged, and to follow-up with neurosurgery as outpatient - Dr. Franky Macho. Patient aseptic, non-toxic appearing, no acute distress. No acute intracranial injury noted. No neurovascular damage noted. Discharged patient. Discussed with patient to please follow-up with Dr. Franky Macho, will get a repeat  of imaging needed. Discussed with patient to follow-up with PCP regarding visit to ED and event. Discussed with to continue to wear neck brace. Instructed patient on how to care for wound site to forehead. Discussed with patient and family to monitor symptoms and if symptoms are to worsen or change to please report back to the ED.  Patient agreed to plan of care, understood, all questions answered.     Raymon Mutton, PA-C 02/28/13 1335

## 2013-02-28 NOTE — ED Notes (Signed)
Soft collar placed at bedside.  PA notified and will apply.

## 2013-03-01 ENCOUNTER — Other Ambulatory Visit (HOSPITAL_COMMUNITY): Payer: Self-pay | Admitting: Cardiovascular Disease

## 2013-03-01 DIAGNOSIS — I739 Peripheral vascular disease, unspecified: Secondary | ICD-10-CM

## 2013-03-14 ENCOUNTER — Ambulatory Visit (HOSPITAL_COMMUNITY)
Admission: RE | Admit: 2013-03-14 | Discharge: 2013-03-14 | Disposition: A | Discharge status: Home / Self Care | Payer: Medicare Other | Source: Ambulatory Visit | Attending: Cardiovascular Disease | Admitting: Cardiovascular Disease

## 2013-03-14 DIAGNOSIS — I739 Peripheral vascular disease, unspecified: Secondary | ICD-10-CM

## 2013-03-14 NOTE — Progress Notes (Signed)
Carotid duplex completed.   03/14/2013  Gertie Fey, RDMS, RDCS

## 2013-04-03 ENCOUNTER — Telehealth: Payer: Self-pay | Admitting: *Deleted

## 2013-04-03 DIAGNOSIS — I6523 Occlusion and stenosis of bilateral carotid arteries: Secondary | ICD-10-CM

## 2013-04-03 NOTE — Telephone Encounter (Signed)
Message copied by Marella Bile on Tue Apr 03, 2013  2:15 PM ------      Message from: Runell Gess      Created: Thu Mar 29, 2013  4:52 PM       Mild-mod BICA stenosis. No signif change. Repeat study in 6 months ------

## 2013-04-10 ENCOUNTER — Encounter: Payer: Self-pay | Admitting: Physical Medicine & Rehabilitation

## 2013-04-10 ENCOUNTER — Encounter: Payer: Medicare Other | Attending: Physical Medicine & Rehabilitation | Admitting: Physical Medicine & Rehabilitation

## 2013-04-10 VITALS — BP 133/60 | HR 62 | Resp 14 | Wt 123.0 lb

## 2013-04-10 DIAGNOSIS — S129XXD Fracture of neck, unspecified, subsequent encounter: Secondary | ICD-10-CM

## 2013-04-10 DIAGNOSIS — S129XXA Fracture of neck, unspecified, initial encounter: Secondary | ICD-10-CM | POA: Insufficient documentation

## 2013-04-10 DIAGNOSIS — I10 Essential (primary) hypertension: Secondary | ICD-10-CM | POA: Insufficient documentation

## 2013-04-10 DIAGNOSIS — IMO0002 Reserved for concepts with insufficient information to code with codable children: Secondary | ICD-10-CM

## 2013-04-10 DIAGNOSIS — I6200 Nontraumatic subdural hemorrhage, unspecified: Secondary | ICD-10-CM

## 2013-04-10 DIAGNOSIS — I4891 Unspecified atrial fibrillation: Secondary | ICD-10-CM | POA: Insufficient documentation

## 2013-04-10 DIAGNOSIS — I619 Nontraumatic intracerebral hemorrhage, unspecified: Secondary | ICD-10-CM | POA: Insufficient documentation

## 2013-04-10 DIAGNOSIS — S065X9A Traumatic subdural hemorrhage with loss of consciousness of unspecified duration, initial encounter: Secondary | ICD-10-CM

## 2013-04-10 NOTE — Progress Notes (Signed)
Subjective:    Patient ID: John Montgomery, male    DOB: 1921-06-16, 79 y.o.   MRN: [id number]  HPI   John Montgomery is back regarding his left frontal ICH. He is at home with daughter.   He is complaining of right ankle/leg spasms. He feels that it's not getting any better.   In April, he had a fall on 4/16 and CT of the neck showed:  1. Nondisplaced fracture of the right C6 articulating facet  adjacent to the right C5-C6 facet joint.  He is still in a cervical collar. Another CT will be checked in 3 weeks or so.     Pain Inventory Average Pain 2 Pain Right Now 0 My pain is intermittent  In the last 24 hours, has pain interfered with the following? General activity 0 Relation with others 0 Enjoyment of life 0 What TIME of day is your pain at its worst? morning Sleep (in general) Poor  Pain is worse with: standing Pain improves with: n/a Relief from Meds: n/a  Mobility use a walker ability to climb steps?  no do you drive?  no  Function retired  Neuro/Psych bladder control problems weakness tremor  Prior Studies Any changes since last visit?  no  Physicians involved in your care Any changes since last visit?  no   No family history on file. History   Social History  . Marital Status: Widowed    Spouse Name: N/A    Number of Children: N/A  . Years of Education: N/A   Social History Main Topics  . Smoking status: Never Smoker   . Smokeless tobacco: Never Used  . Alcohol Use: No  . Drug Use: No  . Sexually Active:    Other Topics Concern  . None   Social History Narrative  . None   Past Surgical History  Procedure Laterality Date  . Angioplasty     Past Medical History  Diagnosis Date  . Hypertension   . Hyperlipidemia   . Myocardial infarction    BP 133/60  Pulse 62  Resp 14  Wt 123 lb (55.792 kg)  SpO2 98%     Review of Systems  Genitourinary: Positive for difficulty urinating.  Neurological: Positive for tremors and  weakness.  Hematological: Bruises/bleeds easily.  All other systems reviewed and are negative.       Objective:   Physical Exam General: Alert and oriented x 3, No apparent distress  HEENT: Head is normocephalic, atraumatic, PERRLA, EOMI, sclera anicteric, oral mucosa pink and moist, dentition intact, ext ear canals clear,  Neck: Supple without JVD or lymphadenopathy  Heart: Reg rate and rhythm. No murmurs rubs or gallops  Chest: CTA bilaterally without wheezes, rales, or rhonchi; no distress  Abdomen: Soft, non-tender, non-distended, bowel sounds positive.  Extremities: No clubbing, cyanosis, or edema. Pulses are 2+  Skin: Clean and intact without signs of breakdown  Neuro: Pt has reasonable insight and awareness. Remains distractible but can be redirected. mild resting tone in right calf 1+.  RUE is 4+/5 for deltoid, biceps/tricep 4+, grip is 4+, HF is 4, KE 4, KF 3-4, ADF and APF are  2/5. Sensation is 2/2. Motor planning issues still. No focal CN deficits. Tremor in right upper ext with volitional movement  Musculoskeletal: Full ROM, No pain with AROM or PROM in the neck, trunk, or extremities. Posture appropriate. Right hip ER's are tight.  Psych: Pt's affect is appropriate. Pt is cooperative   Assessment & Plan:  1. left frontal lobe intracerebral hemorrhage with improving spastic hemiparesis. 2. Paroxysmal atrial fibrillation.  3. Hypertension.  4. Cervical facet fx'es after fall   Plan:  1. Continue with strength, gait training, neuromuscular re-education with PT and OT. Needs to stretch right heel cord . Discussed the use of weighted utencils to help with tremors  2. Reviewed right calf stretching. PT is already working on stretching his right hip ER's. Could considered 3. Will hold off on a custom AFO at this point, given that he's had some improvement in his strength. He remains a little tight in gastrocs however.  4. Follow up with me in about 2months. here in my office.  15 minutes of face to face patient care time were spent during this visit. All questions were encouraged and answered.  5. Cervical collar per NS

## 2013-04-10 NOTE — Patient Instructions (Addendum)
WORK ON STRETCHING OF YOUR CALF AND HIP PER PT INSTRUCTIONS.   USE HEAT AND/OR ICE AT HOME WITH HOME STRETCHING PROGRAM.  HAVE KELLY CALL ME ABOUT POTENTIAL BRACING. I WILL HOLD OFF FOR NOW.  CALL ME WITH ANY PROBLEMS OR QUESTIONS (#[phone]).  HAVE A GOOD DAY  UTILIZE BACLOFEN AT NIGHT FOR CRAMPING/SPASMS  DRINK PLENTY OF FLUIDS

## 2013-04-12 ENCOUNTER — Other Ambulatory Visit: Payer: Self-pay | Admitting: Neurosurgery

## 2013-04-12 DIAGNOSIS — S129XXA Fracture of neck, unspecified, initial encounter: Secondary | ICD-10-CM

## 2013-04-20 ENCOUNTER — Telehealth: Payer: Self-pay

## 2013-04-20 NOTE — Telephone Encounter (Signed)
Suzan an occupational therapist is requesting orders to continue home health occupational therapy.  Left message approving orders.

## 2013-04-26 ENCOUNTER — Ambulatory Visit
Admission: RE | Admit: 2013-04-26 | Discharge: 2013-04-26 | Disposition: A | Discharge status: Home / Self Care | Payer: Medicare Other | Source: Ambulatory Visit | Attending: Neurosurgery | Admitting: Neurosurgery

## 2013-04-26 DIAGNOSIS — S129XXA Fracture of neck, unspecified, initial encounter: Secondary | ICD-10-CM

## 2013-06-22 ENCOUNTER — Ambulatory Visit: Payer: Medicare Other | Attending: Endocrinology

## 2013-06-22 DIAGNOSIS — IMO0001 Reserved for inherently not codable concepts without codable children: Secondary | ICD-10-CM | POA: Insufficient documentation

## 2013-06-22 DIAGNOSIS — R279 Unspecified lack of coordination: Secondary | ICD-10-CM | POA: Insufficient documentation

## 2013-06-22 DIAGNOSIS — R262 Difficulty in walking, not elsewhere classified: Secondary | ICD-10-CM | POA: Insufficient documentation

## 2013-06-26 ENCOUNTER — Ambulatory Visit: Payer: Medicare Other

## 2013-06-28 ENCOUNTER — Ambulatory Visit: Payer: Medicare Other

## 2013-07-02 ENCOUNTER — Ambulatory Visit: Payer: Medicare Other

## 2013-07-04 ENCOUNTER — Ambulatory Visit: Payer: Medicare Other

## 2013-07-09 ENCOUNTER — Ambulatory Visit: Payer: Medicare Other

## 2013-07-11 ENCOUNTER — Encounter: Payer: Medicare Other | Attending: Physical Medicine & Rehabilitation | Admitting: Physical Medicine & Rehabilitation

## 2013-07-11 ENCOUNTER — Ambulatory Visit: Payer: Medicare Other

## 2013-07-11 ENCOUNTER — Encounter: Payer: Self-pay | Admitting: Physical Medicine & Rehabilitation

## 2013-07-11 VITALS — BP 146/60 | HR 71 | Resp 16 | Ht 67.0 in | Wt 124.0 lb

## 2013-07-11 DIAGNOSIS — I10 Essential (primary) hypertension: Secondary | ICD-10-CM | POA: Insufficient documentation

## 2013-07-11 DIAGNOSIS — I480 Paroxysmal atrial fibrillation: Secondary | ICD-10-CM

## 2013-07-11 DIAGNOSIS — R269 Unspecified abnormalities of gait and mobility: Secondary | ICD-10-CM | POA: Insufficient documentation

## 2013-07-11 DIAGNOSIS — S065X9A Traumatic subdural hemorrhage with loss of consciousness of unspecified duration, initial encounter: Secondary | ICD-10-CM

## 2013-07-11 DIAGNOSIS — I6200 Nontraumatic subdural hemorrhage, unspecified: Secondary | ICD-10-CM

## 2013-07-11 DIAGNOSIS — I69959 Hemiplegia and hemiparesis following unspecified cerebrovascular disease affecting unspecified side: Secondary | ICD-10-CM | POA: Insufficient documentation

## 2013-07-11 DIAGNOSIS — I4891 Unspecified atrial fibrillation: Secondary | ICD-10-CM

## 2013-07-11 NOTE — Progress Notes (Signed)
Subjective:    Patient ID: John Montgomery, male    DOB: [birth year], 79 y.o.   MRN: [id number]  HPI  John Montgomery is back regarding his left frontal ICH. He is now out of the cervical collar. He follows up with NS next month.   He is working fairly aggressively on his HEP. He also is at neurorehab working on [employer] as well.  He does have occasional low back pain. Sometimes his right calf is tight.   He is using his walker more and more around the house. He still does use the wheelchair on occasion.     Pain Inventory Average Pain 5 Pain Right Now 5 My pain is intermittent and sharp  In the last 24 hours, has pain interfered with the following? General activity 5 Relation with others 5 Enjoyment of life 5 What TIME of day is your pain at its worst? varies Sleep (in general) Poor  Pain is worse with: bending Pain improves with: pacing activities Relief from Meds: n/a  Mobility use a walker ability to climb steps?  yes do you drive?  no use a wheelchair transfers alone  Function retired I need assistance with the following:  meal prep, household duties and shopping  Neuro/Psych trouble walking spasms  Prior Studies Any changes since last visit?  no  Physicians involved in your care Dr Altheimer, Dr Franky Macho   History reviewed. No pertinent family history. History   Social History  . Marital Status: Widowed    Spouse Name: N/A    Number of Children: N/A  . Years of Education: N/A   Social History Main Topics  . Smoking status: Never Smoker   . Smokeless tobacco: Never Used  . Alcohol Use: No  . Drug Use: No  . Sexual Activity:    Other Topics Concern  . None   Social History Narrative  . None   Past Surgical History  Procedure Laterality Date  . Angioplasty     Past Medical History  Diagnosis Date  . Hypertension   . Hyperlipidemia   . Myocardial infarction    BP 146/60  Pulse 71  Resp 16  Ht 5\' 7"  (1.702 m)  Wt 124 lb (56.246 kg)   BMI 19.42 kg/m2  SpO2 99%     Review of Systems  Musculoskeletal: Positive for gait problem.  Neurological:       Spasms  All other systems reviewed and are negative.       Objective:   Physical Exam  General: Alert and oriented x 3, No apparent distress  HEENT: Head is normocephalic, atraumatic, PERRLA, EOMI, sclera anicteric, oral mucosa pink and moist, dentition intact, ext ear canals clear,  Neck: Supple without JVD or lymphadenopathy  Heart: Reg rate and rhythm. No murmurs rubs or gallops  Chest: CTA bilaterally without wheezes, rales, or rhonchi; no distress  Abdomen: Soft, non-tender, non-distended, bowel sounds positive.  Extremities: No clubbing, cyanosis, or edema. Pulses are 2+  Skin: Clean and intact without signs of breakdown  Neuro: Pt has reasonable insight and awareness. Less distracted. minimal resting tone in right calf trace. RUE is 4+/5 for deltoid, biceps/tricep 4+, grip is 4+, HF is 4, KE 4, KF 3-4, ADF and APF are 2/5. Sensation is 2/2.   No focal CN deficits. Tremor in right upper ext with volitional movement but improved. He walks with a circumducted gait pattern. The left left knee goes into recurvatum in stance, he stubs the foot frequently on the ground.  Musculoskeletal: Full ROM, No pain with AROM or PROM in the neck, trunk, or extremities. Posture appropriate. Right hip ER's are tight.  Psych: Pt's affect is appropriate. Pt is cooperative   Assessment & Plan:   1. left frontal lobe intracerebral hemorrhage with improving spastic hemiparesis.  2. Paroxysmal atrial fibrillation.  3. Hypertension.  4. Cervical facet fx'es after fall--now out of collars     Plan:  1. Continue with strength, gait training, neuromuscular re-education with PT and OT at the neurorehab center.  2. Custom AFO per [facility]. This should help with his gait mechanics.  3. Afib per cardiology 4. Follow up with me in about  4 months. here in my office. 15 minutes of  face to face patient care time were spent during this visit. All questions were encouraged and answered.  5. Cervical collar per NS

## 2013-07-11 NOTE — Patient Instructions (Signed)
WORK ON GOOD FORM AND POSTURE WITH YOUR SITTING, STANDING, AND WALKING!!!!

## 2013-07-13 ENCOUNTER — Ambulatory Visit: Payer: Medicare Other

## 2013-07-17 ENCOUNTER — Ambulatory Visit: Payer: Medicare Other | Attending: Endocrinology

## 2013-07-17 DIAGNOSIS — IMO0001 Reserved for inherently not codable concepts without codable children: Secondary | ICD-10-CM | POA: Insufficient documentation

## 2013-07-17 DIAGNOSIS — R262 Difficulty in walking, not elsewhere classified: Secondary | ICD-10-CM | POA: Insufficient documentation

## 2013-07-17 DIAGNOSIS — R279 Unspecified lack of coordination: Secondary | ICD-10-CM | POA: Insufficient documentation

## 2013-07-19 ENCOUNTER — Ambulatory Visit: Payer: Medicare Other

## 2013-07-23 ENCOUNTER — Ambulatory Visit: Payer: Medicare Other

## 2013-07-26 ENCOUNTER — Ambulatory Visit: Payer: Medicare Other

## 2013-07-31 ENCOUNTER — Ambulatory Visit: Payer: Medicare Other

## 2013-08-02 ENCOUNTER — Ambulatory Visit: Payer: Medicare Other

## 2013-09-11 ENCOUNTER — Telehealth (HOSPITAL_COMMUNITY): Payer: Self-pay | Admitting: *Deleted

## 2013-09-14 ENCOUNTER — Ambulatory Visit: Payer: Medicare Other | Admitting: Cardiovascular Disease

## 2013-09-19 ENCOUNTER — Encounter (HOSPITAL_COMMUNITY): Payer: Medicare Other

## 2013-09-25 ENCOUNTER — Ambulatory Visit (HOSPITAL_COMMUNITY)
Admission: RE | Admit: 2013-09-25 | Discharge: 2013-09-25 | Disposition: A | Discharge status: Home / Self Care | Payer: Medicare Other | Source: Ambulatory Visit | Attending: Cardiovascular Disease | Admitting: Cardiovascular Disease

## 2013-09-25 DIAGNOSIS — I6529 Occlusion and stenosis of unspecified carotid artery: Secondary | ICD-10-CM | POA: Insufficient documentation

## 2013-09-25 DIAGNOSIS — I6523 Occlusion and stenosis of bilateral carotid arteries: Secondary | ICD-10-CM

## 2013-09-25 DIAGNOSIS — I658 Occlusion and stenosis of other precerebral arteries: Secondary | ICD-10-CM | POA: Insufficient documentation

## 2013-09-25 NOTE — Progress Notes (Signed)
Carotid Duplex Completed. °Brianna L Mazza,RVT °

## 2013-10-01 ENCOUNTER — Telehealth: Payer: Self-pay | Admitting: *Deleted

## 2013-10-01 ENCOUNTER — Encounter: Payer: Self-pay | Admitting: *Deleted

## 2013-10-01 DIAGNOSIS — I6529 Occlusion and stenosis of unspecified carotid artery: Secondary | ICD-10-CM

## 2013-10-01 NOTE — Telephone Encounter (Signed)
Message copied by Marella Bile on Mon Oct 01, 2013  9:22 PM ------      Message from: Runell Gess      Created: Sun Sep 30, 2013 10:28 AM       No change from prior study. Repeat in 6 months ------

## 2013-10-01 NOTE — Telephone Encounter (Signed)
Order placed for repeat carotid doppler in 6 months 

## 2013-10-31 ENCOUNTER — Encounter: Payer: Medicare Other | Attending: Physical Medicine & Rehabilitation | Admitting: Physical Medicine & Rehabilitation

## 2013-10-31 ENCOUNTER — Encounter: Payer: Self-pay | Admitting: Physical Medicine & Rehabilitation

## 2013-10-31 VITALS — BP 130/54 | HR 65 | Resp 14 | Ht 67.0 in | Wt 129.6 lb

## 2013-10-31 DIAGNOSIS — I6200 Nontraumatic subdural hemorrhage, unspecified: Secondary | ICD-10-CM

## 2013-10-31 DIAGNOSIS — I10 Essential (primary) hypertension: Secondary | ICD-10-CM | POA: Insufficient documentation

## 2013-10-31 DIAGNOSIS — I4891 Unspecified atrial fibrillation: Secondary | ICD-10-CM | POA: Insufficient documentation

## 2013-10-31 DIAGNOSIS — G8110 Spastic hemiplegia affecting unspecified side: Secondary | ICD-10-CM | POA: Insufficient documentation

## 2013-10-31 DIAGNOSIS — M40299 Other kyphosis, site unspecified: Secondary | ICD-10-CM | POA: Insufficient documentation

## 2013-10-31 DIAGNOSIS — G8100 Flaccid hemiplegia affecting unspecified side: Secondary | ICD-10-CM

## 2013-10-31 DIAGNOSIS — X58XXXA Exposure to other specified factors, initial encounter: Secondary | ICD-10-CM | POA: Insufficient documentation

## 2013-10-31 DIAGNOSIS — I619 Nontraumatic intracerebral hemorrhage, unspecified: Secondary | ICD-10-CM | POA: Insufficient documentation

## 2013-10-31 DIAGNOSIS — I480 Paroxysmal atrial fibrillation: Secondary | ICD-10-CM

## 2013-10-31 DIAGNOSIS — S065X9A Traumatic subdural hemorrhage with loss of consciousness of unspecified duration, initial encounter: Secondary | ICD-10-CM

## 2013-10-31 DIAGNOSIS — G8101 Flaccid hemiplegia affecting right dominant side: Secondary | ICD-10-CM

## 2013-10-31 DIAGNOSIS — M259 Joint disorder, unspecified: Secondary | ICD-10-CM | POA: Insufficient documentation

## 2013-10-31 DIAGNOSIS — S129XXA Fracture of neck, unspecified, initial encounter: Secondary | ICD-10-CM | POA: Insufficient documentation

## 2013-10-31 NOTE — Patient Instructions (Signed)
CALL ME WITH ANY PROBLEMS OR QUESTIONS (#[phone]).    HAPPY HOLIDAYS!!!!!   

## 2013-10-31 NOTE — Progress Notes (Signed)
Subjective:    Patient ID: John Montgomery, male    DOB: 1921/08/27, 79 y.o.   MRN: [id number]  HPI  John Montgomery is back regarding his ICH. He is walking without his AFO because he didn't like that the AFO turned his foot outward. He uses a walker for balance. He has had two or three falls at home when he got "off balance." he might have "leaned over" too much.       Pain Inventory Average Pain 6 Pain Right Now 0 My pain is sharp  In the last 24 hours, has pain interfered with the following? General activity 10 Relation with others 5 Enjoyment of life 5 What TIME of day is your pain at its worst? varies Sleep (in general) Poor  Pain is worse with: walking, bending, standing and some activites Pain improves with: rest Relief from Meds: 2  Mobility walk with assistance use a cane do you drive?  no use a wheelchair  Function retired  Neuro/Psych weakness numbness tremor tingling trouble walking spasms  Prior Studies Any changes since last visit?  no  Physicians involved in your care Any changes since last visit?  no   History reviewed. No pertinent family history. History   Social History  . Marital Status: Widowed    Spouse Name: N/A    Number of Children: N/A  . Years of Education: N/A   Social History Main Topics  . Smoking status: Never Smoker   . Smokeless tobacco: Never Used  . Alcohol Use: No  . Drug Use: No  . Sexual Activity:    Other Topics Concern  . None   Social History Narrative  . None   Past Surgical History  Procedure Laterality Date  . Angioplasty     Past Medical History  Diagnosis Date  . Hypertension   . Hyperlipidemia   . Myocardial infarction    BP 130/54  Pulse 65  Resp 14  Ht 5\' 7"  (1.702 m)  Wt 129 lb 9.6 oz (58.786 kg)  BMI 20.29 kg/m2  SpO2 97%     Review of Systems  Musculoskeletal: Positive for gait problem.  Neurological: Positive for tremors, weakness and numbness.       Tingling, sapsms  All  other systems reviewed and are negative.       Objective:   Physical Exam General: Alert and oriented x 3, No apparent distress  HEENT: Head is normocephalic, atraumatic, PERRLA, EOMI, sclera anicteric, oral mucosa pink and moist, dentition intact, ext ear canals clear,  Neck: Supple without JVD or lymphadenopathy  Heart: Reg rate and rhythm. No murmurs rubs or gallops  Chest: CTA bilaterally without wheezes, rales, or rhonchi; no distress  Abdomen: Soft, non-tender, non-distended, bowel sounds positive.  Extremities: No clubbing, cyanosis, or edema. Pulses are 2+  Skin: Clean and intact without signs of breakdown  Neuro: Pt has reasonable insight and awareness. Less distracted. minimal resting tone in right calf trace. RUE is 4+/5 for deltoid, biceps/tricep 4+, grip is 4+, HF is 4, KE 4, KF 3-4, ADF and APF are 2/5. Sensation is 2/2. No focal CN deficits. Tremor in right upper ext with volitional movement but improved. He walks with a circumducted gait pattern and sometimes steppage gait pattern.. The left left knee goes into recurvatum in stance, and he often places the foot far in front of the body to help with clearing and knee stability.  Musculoskeletal: Full ROM, No pain with AROM or PROM in the neck, trunk,  or extremities. Posture appropriate. Right hip ER's are tight.   Psych: Pt's affect is appropriate. Pt is cooperative    Assessment & Plan:   1. left frontal lobe intracerebral hemorrhage with improving spastic hemiparesis.  2. Paroxysmal atrial fibrillation.  3. Hypertension.  4. Cervical facet fx'es after fall--now out of collars    Plan:  1. He is at substantial risk for falling given his gait pattern. Either he needs to look at using his wheelchair at home or we need to consider a KAFO. He has issues with substantial weakness at the ankle, recurvatum at the right knee, and thoracic hyphosis. I have made a referral to [facility] for an assessment. I had a frank  conversation with the patient and his daughter as well.  2. Follow up with me in 2 months. 30 minutes of face to face patient care time were spent during this visit. All questions were encouraged and answered.

## 2013-11-28 ENCOUNTER — Ambulatory Visit: Payer: Medicare Other | Admitting: Podiatry

## 2013-12-03 ENCOUNTER — Encounter: Payer: Self-pay | Admitting: Podiatry

## 2013-12-03 ENCOUNTER — Ambulatory Visit (INDEPENDENT_AMBULATORY_CARE_PROVIDER_SITE_OTHER): Payer: Medicare Other | Admitting: Podiatry

## 2013-12-03 VITALS — BP 151/67 | HR 70 | Resp 18

## 2013-12-03 DIAGNOSIS — M79609 Pain in unspecified limb: Secondary | ICD-10-CM

## 2013-12-03 DIAGNOSIS — B351 Tinea unguium: Secondary | ICD-10-CM

## 2013-12-03 NOTE — Progress Notes (Addendum)
   Subjective:    Patient ID: John Montgomery, male    DOB: 09-10-1921, 80 y.o.   MRN: [id number]  HPI I am here to get my toenails cut. He is complaining of uncomfortable toenails  Please note the initial dictation on this patient for the encounter of 12/04/2013 was iincorrect, and was confused with another patient. I redictated this encounter as described below.   Review of Systems     Objective:   Physical Exam  Orientated x43 80 year old white male  Dermatological: Hypertrophic, brittle, elongated toenails x10.        Assessment & Plan:   Assessment: Symptomatic onychomycoses x10  Plan: All 10 toenails debrided without any bleeding. Reappoint at three-month intervals.

## 2013-12-31 ENCOUNTER — Ambulatory Visit: Payer: Medicare Other | Admitting: Physical Medicine & Rehabilitation

## 2014-01-23 ENCOUNTER — Encounter: Payer: Medicare Other | Attending: Physical Medicine & Rehabilitation | Admitting: Physical Medicine & Rehabilitation

## 2014-01-23 ENCOUNTER — Encounter: Payer: Self-pay | Admitting: Physical Medicine & Rehabilitation

## 2014-01-23 VITALS — BP 137/53 | HR 71 | Resp 16 | Ht 67.0 in | Wt 130.0 lb

## 2014-01-23 DIAGNOSIS — M40299 Other kyphosis, site unspecified: Secondary | ICD-10-CM | POA: Insufficient documentation

## 2014-01-23 DIAGNOSIS — I10 Essential (primary) hypertension: Secondary | ICD-10-CM | POA: Insufficient documentation

## 2014-01-23 DIAGNOSIS — I4891 Unspecified atrial fibrillation: Secondary | ICD-10-CM

## 2014-01-23 DIAGNOSIS — S065X9A Traumatic subdural hemorrhage with loss of consciousness of unspecified duration, initial encounter: Secondary | ICD-10-CM

## 2014-01-23 DIAGNOSIS — G8101 Flaccid hemiplegia affecting right dominant side: Secondary | ICD-10-CM

## 2014-01-23 DIAGNOSIS — G8110 Spastic hemiplegia affecting unspecified side: Secondary | ICD-10-CM | POA: Insufficient documentation

## 2014-01-23 DIAGNOSIS — G8100 Flaccid hemiplegia affecting unspecified side: Secondary | ICD-10-CM

## 2014-01-23 DIAGNOSIS — X58XXXA Exposure to other specified factors, initial encounter: Secondary | ICD-10-CM | POA: Insufficient documentation

## 2014-01-23 DIAGNOSIS — S065XAA Traumatic subdural hemorrhage with loss of consciousness status unknown, initial encounter: Secondary | ICD-10-CM

## 2014-01-23 DIAGNOSIS — S129XXA Fracture of neck, unspecified, initial encounter: Secondary | ICD-10-CM | POA: Insufficient documentation

## 2014-01-23 DIAGNOSIS — I619 Nontraumatic intracerebral hemorrhage, unspecified: Secondary | ICD-10-CM | POA: Insufficient documentation

## 2014-01-23 DIAGNOSIS — I480 Paroxysmal atrial fibrillation: Secondary | ICD-10-CM

## 2014-01-23 DIAGNOSIS — M259 Joint disorder, unspecified: Secondary | ICD-10-CM | POA: Insufficient documentation

## 2014-01-23 DIAGNOSIS — I6200 Nontraumatic subdural hemorrhage, unspecified: Secondary | ICD-10-CM

## 2014-01-23 NOTE — Patient Instructions (Signed)
PLEASE CALL ME WITH ANY PROBLEMS OR QUESTIONS (#[phone]).      

## 2014-01-23 NOTE — Progress Notes (Signed)
Subjective:    Patient ID: John Montgomery, male    DOB: 1921/10/07, 80 y.o.   MRN: [id number]  HPI  John Montgomery is back regarding his ICH. He saw the prosthetist who made some adjustments. He feels he walks "straighter" with the foot out of the brace. He had one fall when he slipped from the edge of the bath tub while he was cleaning up.   He is walking and exercising every day at home.   Pain Inventory Average Pain 6 Pain Right Now 5 My pain is sharp  In the last 24 hours, has pain interfered with the following? General activity 7 Relation with others 7 Enjoyment of life 7 What TIME of day is your pain at its worst? day and night Sleep (in general) Fair  Pain is worse with: bending Pain improves with: rest Relief from Meds: n/a  Mobility use a walker do you drive?  no transfers alone  Function retired  Neuro/Psych bladder control problems weakness numbness tremor tingling trouble walking spasms  Prior Studies Any changes since last visit?  no  Physicians involved in your care Any changes since last visit?  no   History reviewed. No pertinent family history. History   Social History  . Marital Status: Widowed    Spouse Name: N/A    Number of Children: N/A  . Years of Education: N/A   Social History Main Topics  . Smoking status: Never Smoker   . Smokeless tobacco: Never Used  . Alcohol Use: No  . Drug Use: No  . Sexual Activity:    Other Topics Concern  . None   Social History Narrative  . None   Past Surgical History  Procedure Laterality Date  . Angioplasty     Past Medical History  Diagnosis Date  . Hypertension   . Hyperlipidemia   . Myocardial infarction    BP 137/53  Pulse 71  Resp 16  Ht 5\' 7"  (1.702 m)  Wt 130 lb (58.968 kg)  BMI 20.36 kg/m2  SpO2 96%  Opioid Risk Score:   Fall Risk Score: High Fall Risk (>13 points) (patient educated handout given)   Review of Systems  Genitourinary: Positive for frequency.    Musculoskeletal: Positive for gait problem.  Neurological: Positive for tremors, weakness and numbness.  All other systems reviewed and are negative.       Objective:   Physical Exam  General: Alert and oriented x 3, No apparent distress  HEENT: Head is normocephalic, atraumatic, PERRLA, EOMI, sclera anicteric, oral mucosa pink and moist, dentition intact, ext ear canals clear,  Neck: Supple without JVD or lymphadenopathy  Heart: Reg rate and rhythm. No murmurs rubs or gallops  Chest: CTA bilaterally without wheezes, rales, or rhonchi; no distress  Abdomen: Soft, non-tender, non-distended, bowel sounds positive.  Extremities: No clubbing, cyanosis, or edema. Pulses are 2+  Skin: Clean and intact without signs of breakdown  Neuro: Pt has reasonable insight and awareness. Less distracted. minimal resting tone in right calf trace. RUE is 4+/5 for deltoid, biceps/tricep 4+, grip is 4+, HF is 4, KE 4, KF 3-4, ADF and APF are 2/5. Sensation is 2/2. No focal CN deficits. Tremor in right upper ext with volitional movement but improved. He walks with an externally rotated leg pattern on the right. His knee display less recurvatum in stance today. Marland Kitchen  He still places the right leg out in front at times to help with clearance. He is much steadier overall today  Musculoskeletal: Full ROM, No pain with AROM or PROM in the neck, trunk, or extremities. Posture appropriate. Right hip ER's are tight.  Psych: Pt's affect is appropriate. Pt is cooperative    Assessment & Plan:   1. left frontal lobe intracerebral hemorrhage with improving spastic right hemiparesis.  2. Paroxysmal atrial fibrillation.  3. Hypertension.  4. Cervical facet fx'es after fall--healed   Plan:  1. He is at least wearing the brace. I encouraged him to work on technique as much as possible. His gait is not perfect with the AFO, but it is MUCH more stable. I would be willing to make a referral to outpt therapies to improve his gait  quality if he should so choose 2. Follow up with me prn. 30 minutes of face to face patient care time were spent during this visit. All questions were encouraged and answered.

## 2014-03-11 ENCOUNTER — Telehealth: Payer: Self-pay | Admitting: Cardiovascular Disease

## 2014-03-11 NOTE — Telephone Encounter (Signed)
Deedie - Pt has an order for carotid doppler.  [place] forward to [place] to discuss if appt needed or not.  Message forwarded to Samara Deist[place]athryn, [place].

## 2014-03-11 NOTE — Telephone Encounter (Signed)
Yes, he needs a carotid doppler.  There is currently an order in Epic for the carotid that was placed at the time of his last carotid in Nov 2014

## 2014-03-11 NOTE — Telephone Encounter (Signed)
Daughter called to see if it's time to schedule John Montgomery's carotid doppler.  Last one was in April 2014.  Looks like patient has not seen Dr. Allyson SabalBerry since 2013.  Does he need to see JB before he has carotid. Will need order in epic for carotid.Marland Kitchen..Marland Kitchen

## 2014-03-11 NOTE — Telephone Encounter (Signed)
Please make ROV with Dr Allyson SabalBerry after doppler

## 2014-03-11 NOTE — Telephone Encounter (Signed)
Please see previous message

## 2014-03-25 ENCOUNTER — Ambulatory Visit (HOSPITAL_COMMUNITY)
Admission: RE | Admit: 2014-03-25 | Discharge: 2014-03-25 | Disposition: A | Discharge status: Home / Self Care | Payer: Medicare Other | Source: Ambulatory Visit | Attending: Cardiovascular Disease | Admitting: Cardiovascular Disease

## 2014-03-25 DIAGNOSIS — I6529 Occlusion and stenosis of unspecified carotid artery: Secondary | ICD-10-CM | POA: Insufficient documentation

## 2014-03-25 NOTE — Progress Notes (Signed)
Carotid Duplex Completed. Celeste Candelas, BS, RDMS, RVT  

## 2014-03-29 ENCOUNTER — Encounter: Payer: Self-pay | Admitting: *Deleted

## 2014-03-29 ENCOUNTER — Telehealth: Payer: Self-pay | Admitting: *Deleted

## 2014-03-29 DIAGNOSIS — I6529 Occlusion and stenosis of unspecified carotid artery: Secondary | ICD-10-CM

## 2014-03-29 NOTE — Telephone Encounter (Signed)
Order placed for repeat carotid dopplers in 6 months  

## 2014-03-29 NOTE — Telephone Encounter (Signed)
Message copied by Marella BileVOGEL, Connelly [place] W. on Fri Mar 29, 2014 12:30 PM ------      Message from: Runell GessBERRY, JONATHAN J      Created: Thu Mar 28, 2014  5:39 PM       No change from prior study. Repeat in 6 months ------

## 2014-04-09 ENCOUNTER — Ambulatory Visit (INDEPENDENT_AMBULATORY_CARE_PROVIDER_SITE_OTHER): Payer: Medicare Other

## 2014-04-09 VITALS — BP 151/67 | HR 99 | Resp 12

## 2014-04-09 DIAGNOSIS — R269 Unspecified abnormalities of gait and mobility: Secondary | ICD-10-CM

## 2014-04-09 DIAGNOSIS — M79609 Pain in unspecified limb: Secondary | ICD-10-CM

## 2014-04-09 DIAGNOSIS — Q828 Other specified congenital malformations of skin: Secondary | ICD-10-CM

## 2014-04-09 DIAGNOSIS — B351 Tinea unguium: Secondary | ICD-10-CM

## 2014-04-09 DIAGNOSIS — I739 Peripheral vascular disease, unspecified: Secondary | ICD-10-CM

## 2014-04-09 NOTE — Progress Notes (Signed)
   Subjective:    Patient ID: John Montgomery, male    DOB: 05-20-1921, 80 y.o.   MRN: [id number]  HPI TOENAILS TRIM AND CALLUS ON THE RT FOOT NEED TO BE TRIM.   Review of Systems no new systemic changes or findings noted     Objective:   Physical Exam Lower extremity objective findings as follows vascular status is intact with pedal pulses palpable with thready DP plus one over 4 PT plus one over 4 bilateral capillary refill time 3 seconds all digits there is atrophy of the plantar fat pad patient does have gait abnormality with the spastic contracture of the right foot secondary to history of stroke with an AFO up rate brace. Nails thick brittle crumbly friable discolored 1 through 5 bilateral painful and tender both and debridement include shoe and ambulation also has a degree keratotic lesion sub-fifth MTP area right with some keratosis there is no maceration there is definite atrophy of fat pad patient's nails thick brittle crumbly friable criptotic are debrided also single keratotic lesion is debrided although I do not think is the comfort for his pain patient is atrophy of the fat pad and arthrosis and digital contractures contributing to his pain and abnormal gait symptomology.       Assessment & Plan:  Assessment this time dystrophy of PAD or peripheral vascular disease and cover minus gait abnormality dystrophic there is keratoses which is debrided as well as multiple dystrophic friable gratified thick nails painful tender symptomatically nails 1 through 5 bilateral debridement at this time return 3-4 months for continued palliative care in the future as needed next  Alvan Dame DPM

## 2014-04-09 NOTE — Patient Instructions (Signed)

## 2014-04-16 ENCOUNTER — Ambulatory Visit: Payer: Medicare Other | Admitting: Cardiovascular Disease

## 2014-08-13 ENCOUNTER — Ambulatory Visit: Payer: Medicare Other

## 2014-09-13 ENCOUNTER — Encounter (HOSPITAL_COMMUNITY): Payer: Self-pay | Admitting: *Deleted

## 2014-09-21 ENCOUNTER — Encounter (HOSPITAL_COMMUNITY): Payer: Self-pay | Admitting: Emergency Medicine

## 2014-09-21 ENCOUNTER — Emergency Department (HOSPITAL_COMMUNITY)
Admission: EM | Admit: 2014-09-21 | Discharge: 2014-09-22 | Disposition: A | Discharge status: Home / Self Care | Payer: Medicare Other | Attending: Emergency Medicine | Admitting: Emergency Medicine

## 2014-09-21 ENCOUNTER — Emergency Department (HOSPITAL_COMMUNITY): Payer: Medicare Other

## 2014-09-21 DIAGNOSIS — Y9389 Activity, other specified: Secondary | ICD-10-CM | POA: Diagnosis not present

## 2014-09-21 DIAGNOSIS — Y92091 Bathroom in other non-institutional residence as the place of occurrence of the external cause: Secondary | ICD-10-CM | POA: Diagnosis not present

## 2014-09-21 DIAGNOSIS — W01198A Fall on same level from slipping, tripping and stumbling with subsequent striking against other object, initial encounter: Secondary | ICD-10-CM | POA: Insufficient documentation

## 2014-09-21 DIAGNOSIS — Z8673 Personal history of transient ischemic attack (TIA), and cerebral infarction without residual deficits: Secondary | ICD-10-CM | POA: Diagnosis not present

## 2014-09-21 DIAGNOSIS — Z880 Allergy status to penicillin: Secondary | ICD-10-CM | POA: Insufficient documentation

## 2014-09-21 DIAGNOSIS — Z79899 Other long term (current) drug therapy: Secondary | ICD-10-CM | POA: Diagnosis not present

## 2014-09-21 DIAGNOSIS — W19XXXA Unspecified fall, initial encounter: Secondary | ICD-10-CM

## 2014-09-21 DIAGNOSIS — S2241XA Multiple fractures of ribs, right side, initial encounter for closed fracture: Secondary | ICD-10-CM | POA: Diagnosis not present

## 2014-09-21 DIAGNOSIS — I252 Old myocardial infarction: Secondary | ICD-10-CM | POA: Diagnosis not present

## 2014-09-21 DIAGNOSIS — I10 Essential (primary) hypertension: Secondary | ICD-10-CM | POA: Diagnosis not present

## 2014-09-21 DIAGNOSIS — S299XXA Unspecified injury of thorax, initial encounter: Secondary | ICD-10-CM | POA: Diagnosis present

## 2014-09-21 DIAGNOSIS — Y998 Other external cause status: Secondary | ICD-10-CM | POA: Insufficient documentation

## 2014-09-21 DIAGNOSIS — E785 Hyperlipidemia, unspecified: Secondary | ICD-10-CM | POA: Diagnosis not present

## 2014-09-21 DIAGNOSIS — S2231XA Fracture of one rib, right side, initial encounter for closed fracture: Secondary | ICD-10-CM

## 2014-09-21 DIAGNOSIS — M6281 Muscle weakness (generalized): Secondary | ICD-10-CM | POA: Insufficient documentation

## 2014-09-21 HISTORY — DX: Cerebral infarction, unspecified: I63.9

## 2014-09-21 MED ORDER — TRAMADOL HCL 50 MG PO TABS
50.0000 mg | ORAL_TABLET | Freq: Four times a day (QID) | ORAL | Status: DC | PRN
Start: 2014-09-21 — End: 2015-11-26

## 2014-09-21 MED ORDER — HYDROCODONE-ACETAMINOPHEN 5-325 MG PO TABS
1.0000 | ORAL_TABLET | ORAL | Status: AC
  Administered 2014-09-21: 1 via ORAL
  Filled 2014-09-21: qty 1

## 2014-09-21 NOTE — ED Notes (Signed)
Bed: WA08 Expected date:  Expected time:  Means of arrival:  Comments: EMS 80yo M Fall

## 2014-09-21 NOTE — Discharge Instructions (Signed)

## 2014-09-21 NOTE — ED Provider Notes (Signed)
CSN: [id number]     Arrival date & time 09/21/14  2047 History   First MD Initiated Contact with Patient 09/21/14 2104     Chief Complaint  Patient presents with  . Fall    HPI Pt was using his walker while he was in the bathroom this evening.  He was using his walker and tried to sit down on the commode but missed it.  He fell backwards striking his back. Pt is having some pain now in his back and side.  The pain is sharp.  On the right side.  It increases with breathing.  No shortness of breath.  No nausea or vomiting.  No loc.  No new weakness. Past Medical History  Diagnosis Date  . Hypertension   . Hyperlipidemia   . Myocardial infarction   . Stroke 2013   Past Surgical History  Procedure Laterality Date  . Angioplasty     No family history on file. History  Substance Use Topics  . Smoking status: Never Smoker   . Smokeless tobacco: Never Used  . Alcohol Use: No    Review of Systems  All other systems reviewed and are negative.     Allergies  Penicillins  Home Medications   Prior to Admission medications   Medication Sig Start Date End Date Taking? Authorizing Provider  amLODipine (NORVASC) 5 MG tablet Take 5 mg by mouth daily.   Yes Historical Provider, MD  atorvastatin (LIPITOR) 20 MG tablet Take 20 mg by mouth daily.   Yes Historical Provider, MD  desonide (DESOWEN) 0.05 % cream Apply 1 application topically 2 (two) times daily as needed (dry skin).  03/21/14  Yes Historical Provider, MD  fluticasone (CUTIVATE) 0.05 % cream Apply 1 application topically 2 (two) times daily as needed (dry skin).  03/21/14  Yes Historical Provider, MD  metoprolol tartrate (LOPRESSOR) 25 MG tablet Take 25 mg by mouth 2 (two) times daily.   Yes Historical Provider, MD  pantoprazole (PROTONIX) 40 MG tablet Take 40 mg by mouth daily.   Yes Historical Provider, MD  baclofen (LIORESAL) 10 MG tablet Take 5 mg by mouth at bedtime as needed for muscle spasms.     Historical Provider, MD   traMADol (ULTRAM) 50 MG tablet Take 1 tablet (50 mg total) by mouth every 6 (six) hours as needed. 09/21/14   Linwood DibblesJon Adriell Polansky, MD   BP 147/53 mmHg  Pulse 66  Temp(Src) 97.8 F (36.6 C) (Oral)  Resp 16  SpO2 96% Physical Exam  Constitutional: No distress.  Elderly   HENT:  Head: Normocephalic and atraumatic.  Right Ear: External ear normal.  Left Ear: External ear normal.  Eyes: Conjunctivae are normal. Right eye exhibits no discharge. Left eye exhibits no discharge. No scleral icterus.  Neck: Neck supple. No tracheal deviation present.  Cardiovascular: Normal rate, regular rhythm and intact distal pulses.   Pulmonary/Chest: Effort normal and breath sounds normal. No stridor. No respiratory distress. He has no wheezes. He has no rales. He exhibits tenderness (right lateral posterior inferior ribs).  Abdominal: Soft. Bowel sounds are normal. He exhibits no distension. There is no tenderness. There is no rebound and no guarding.  Musculoskeletal: He exhibits no edema or tenderness.  Right leg in a foot drop brace  Neurological: He is alert. He has normal strength. No cranial nerve deficit (no facial droop, extraocular movements intact, no slurred speech) or sensory deficit. He exhibits normal muscle tone. He displays no seizure activity. Coordination normal.  Weakness rle  Skin: Skin is warm and dry. No rash noted.  Psychiatric: He has a normal mood and affect.  Nursing note and vitals reviewed.   ED Course  Procedures (including critical care time) Labs Review Labs Reviewed - No data to display  Imaging Review Dg Ribs Unilateral W/chest Right  09/21/2014   CLINICAL DATA:  Patient fell and struck back on edge of bathtub this evening. Right posterior rib and back pain.  EXAM: RIGHT RIBS AND CHEST - 3+ VIEW  COMPARISON:  Chest 10/16/2012  FINDINGS: Shallow inspiration. Normal heart size and pulmonary vascularity. No focal airspace disease or consolidation in the lungs. No blunting of  costophrenic angles. No pneumothorax. Mediastinal contours appear intact. With mildly displaced fractures of the lateral right eighth and ninth ribs. No destructive rib lesions appreciated.  IMPRESSION: No evidence of active pulmonary disease. Mildly displaced acute fractures of the right lateral eighth and ninth ribs.   Electronically Signed   By: Burman NievesWilliam  Stevens M.D.   On: 09/21/2014 22:04   Medications  HYDROcodone-acetaminophen (NORCO/VICODIN) 5-325 MG per tablet 1 tablet (not administered)     MDM   Final diagnoses:  Fall  Right rib fracture, closed, initial encounter    Discussed findings with family.  Will dc home with pain medications.  Incentive spirometer.   Encouraged deep breathing.  Follow up with PCP    Linwood DibblesJon Mattias Walmsley, MD 09/21/14 [phone]

## 2014-09-21 NOTE — ED Notes (Addendum)
[facility]. EMS reports patient was found by staff after fall. Patient hit back on edge of bathtub. Denies neck pain, increased pain with deep breathing. Pt A&O x4.  Vs 152/62BP,  70HR, 16Resp.

## 2014-09-21 NOTE — ED Notes (Signed)
Patient c/o 6/10 pain to posterior mid right back, increasing pain with deep breaths. Patient isn't on anticoagulation therapy. A&O x4. Family at bedside.

## 2014-09-22 DIAGNOSIS — S2241XA Multiple fractures of ribs, right side, initial encounter for closed fracture: Secondary | ICD-10-CM | POA: Diagnosis not present

## 2014-10-02 ENCOUNTER — Encounter: Payer: Self-pay | Admitting: *Deleted

## 2014-10-03 ENCOUNTER — Encounter (HOSPITAL_COMMUNITY): Payer: Self-pay | Admitting: Emergency Medicine

## 2014-10-03 ENCOUNTER — Emergency Department (HOSPITAL_COMMUNITY)
Admission: EM | Admit: 2014-10-03 | Discharge: 2014-10-04 | Disposition: A | Discharge status: Home / Self Care | Payer: Medicare Other | Attending: Emergency Medicine | Admitting: Emergency Medicine

## 2014-10-03 DIAGNOSIS — Z880 Allergy status to penicillin: Secondary | ICD-10-CM | POA: Diagnosis not present

## 2014-10-03 DIAGNOSIS — Z79899 Other long term (current) drug therapy: Secondary | ICD-10-CM | POA: Diagnosis not present

## 2014-10-03 DIAGNOSIS — I2510 Atherosclerotic heart disease of native coronary artery without angina pectoris: Secondary | ICD-10-CM | POA: Insufficient documentation

## 2014-10-03 DIAGNOSIS — K5900 Constipation, unspecified: Secondary | ICD-10-CM | POA: Diagnosis present

## 2014-10-03 DIAGNOSIS — I10 Essential (primary) hypertension: Secondary | ICD-10-CM | POA: Diagnosis not present

## 2014-10-03 DIAGNOSIS — E785 Hyperlipidemia, unspecified: Secondary | ICD-10-CM | POA: Diagnosis not present

## 2014-10-03 DIAGNOSIS — Z8673 Personal history of transient ischemic attack (TIA), and cerebral infarction without residual deficits: Secondary | ICD-10-CM | POA: Diagnosis not present

## 2014-10-03 DIAGNOSIS — K644 Residual hemorrhoidal skin tags: Secondary | ICD-10-CM | POA: Diagnosis not present

## 2014-10-03 DIAGNOSIS — I252 Old myocardial infarction: Secondary | ICD-10-CM | POA: Diagnosis not present

## 2014-10-03 DIAGNOSIS — K5909 Other constipation: Secondary | ICD-10-CM | POA: Diagnosis not present

## 2014-10-03 DIAGNOSIS — Z8781 Personal history of (healed) traumatic fracture: Secondary | ICD-10-CM | POA: Diagnosis not present

## 2014-10-03 LAB — I-STAT CHEM 8, ED
BUN: 21 mg/dL (ref 6–23)
Calcium, Ion: 1.09 mmol/L — ABNORMAL LOW (ref 1.13–1.30)
Chloride: 96 mEq/L (ref 96–112)
Creatinine, Ser: 1 mg/dL (ref 0.50–1.35)
GLUCOSE: 110 mg/dL — AB (ref 70–99)
HEMATOCRIT: 38 % — AB (ref 39.0–52.0)
HEMOGLOBIN: 12.9 g/dL — AB (ref 13.0–17.0)
Potassium: 4.2 mEq/L (ref 3.7–5.3)
SODIUM: 131 meq/L — AB (ref 137–147)
TCO2: 24 mmol/L (ref 0–100)

## 2014-10-03 LAB — CBC WITH DIFFERENTIAL/PLATELET
BASOS PCT: 0 % (ref 0–1)
Basophils Absolute: 0 10*3/uL (ref 0.0–0.1)
EOS ABS: 0 10*3/uL (ref 0.0–0.7)
EOS PCT: 0 % (ref 0–5)
HCT: 35.4 % — ABNORMAL LOW (ref 39.0–52.0)
Hemoglobin: 11.9 g/dL — ABNORMAL LOW (ref 13.0–17.0)
Lymphocytes Relative: 5 % — ABNORMAL LOW (ref 12–46)
Lymphs Abs: 0.8 10*3/uL (ref 0.7–4.0)
MCH: 30.5 pg (ref 26.0–34.0)
MCHC: 33.6 g/dL (ref 30.0–36.0)
MCV: 90.8 fL (ref 78.0–100.0)
Monocytes Absolute: 1 10*3/uL (ref 0.1–1.0)
Monocytes Relative: 6 % (ref 3–12)
Neutro Abs: 16.1 10*3/uL — ABNORMAL HIGH (ref 1.7–7.7)
Neutrophils Relative %: 89 % — ABNORMAL HIGH (ref 43–77)
PLATELETS: 230 10*3/uL (ref 150–400)
RBC: 3.9 MIL/uL — ABNORMAL LOW (ref 4.22–5.81)
RDW: 12.5 % (ref 11.5–15.5)
WBC: 18 10*3/uL — ABNORMAL HIGH (ref 4.0–10.5)

## 2014-10-03 LAB — URINALYSIS, ROUTINE W REFLEX MICROSCOPIC
BILIRUBIN URINE: NEGATIVE
Glucose, UA: NEGATIVE mg/dL
Hgb urine dipstick: NEGATIVE
Ketones, ur: 15 mg/dL — AB
Leukocytes, UA: NEGATIVE
NITRITE: NEGATIVE
PROTEIN: NEGATIVE mg/dL
SPECIFIC GRAVITY, URINE: 1.017 (ref 1.005–1.030)
UROBILINOGEN UA: 1 mg/dL (ref 0.0–1.0)
pH: 7 (ref 5.0–8.0)

## 2014-10-03 MED ORDER — HYDROCORTISONE 2.5 % RE CREA: TOPICAL_CREAM | RECTAL | Status: AC

## 2014-10-03 MED ORDER — POLYETHYLENE GLYCOL 3350 17 G PO PACK: 17.0000 g | PACK | Freq: Every day | ORAL | Status: AC

## 2014-10-03 NOTE — Discharge Instructions (Signed)

## 2014-10-03 NOTE — ED Notes (Signed)
Notified Harris,MD., pt. Post residual bladder scan 131 mL.

## 2014-10-03 NOTE — ED Notes (Signed)
[facility].  Per facility staff, on call MD couldn't order any meds for constipation/hemorrhoid pain and pt's family was requesting pt be sent to ED for evaluation.  Per EMS pt had large BM when they arrived on scene because they had to help clean pt up.

## 2014-10-03 NOTE — ED Provider Notes (Signed)
CSN: [id number]     Arrival date & time 10/03/14  1857 History   First MD Initiated Contact with Patient 10/03/14 2041     Chief Complaint  Patient presents with  . Constipation  . Hemorrhoids     (Consider location/radiation/quality/duration/timing/severity/associated sxs/prior Treatment) Patient is a [age] y.o. male presenting with constipation.  Constipation Severity:  Mild Time since last bowel movement:  3 days Timing:  Constant Progression:  Unchanged Chronicity:  New Context: medication   Stool description:  None produced Unusual stool frequency:  Daily Relieved by:  Nothing Worsened by:  Nothing tried Ineffective treatments: Milk of magnesia today. Associated symptoms: no abdominal pain, no diarrhea, no dysuria, no fever, no nausea and no vomiting     Past Medical History  Diagnosis Date  . Hypertension   . Hyperlipidemia   . Myocardial infarction   . Stroke 2013  . CAD (coronary artery disease)     stents placed by Dr Elsie LincolnGamble  . History of stress test 08/2010    Low risk study  . Carotid disease, bilateral     revealing moderate right and mild left ICA stenosis.  Marland Kitchen. Hx of echocardiogram 12/2009    Showed good LV function   Past Surgical History  Procedure Laterality Date  . Angioplasty  2006    RCA stenting by Dr Elsie LincolnGamble   Family History  Problem Relation Age of Onset  . Kidney disease Mother    History  Substance Use Topics  . Smoking status: Never Smoker   . Smokeless tobacco: Never Used  . Alcohol Use: No    Review of Systems  Constitutional: Negative for fever.  HENT: Negative for drooling and rhinorrhea.   Eyes: Negative for pain.  Respiratory: Negative for cough and shortness of breath.   Cardiovascular: Negative for chest pain and leg swelling.  Gastrointestinal: Positive for constipation. Negative for nausea, vomiting, abdominal pain and diarrhea.       Rectal pain  Genitourinary: Negative for dysuria and hematuria.  Musculoskeletal:  Negative for gait problem and neck pain.  Skin: Negative for color change.  Neurological: Negative for numbness and headaches.  Hematological: Negative for adenopathy.  Psychiatric/Behavioral: Negative for behavioral problems.  All other systems reviewed and are negative.     Allergies  Penicillins  Home Medications   Prior to Admission medications   Medication Sig Start Date End Date Taking? Authorizing Provider  amLODipine (NORVASC) 5 MG tablet Take 5 mg by mouth daily.   Yes Historical Provider, MD  atorvastatin (LIPITOR) 20 MG tablet Take 20 mg by mouth at bedtime.    Yes Historical Provider, MD  metoprolol tartrate (LOPRESSOR) 25 MG tablet Take 25 mg by mouth 2 (two) times daily.   Yes Historical Provider, MD  pantoprazole (PROTONIX) 40 MG tablet Take 40 mg by mouth at bedtime.    Yes Historical Provider, MD  traMADol (ULTRAM) 50 MG tablet Take 1 tablet (50 mg total) by mouth every 6 (six) hours as needed. 09/21/14  Yes Linwood DibblesJon Knapp, MD  baclofen (LIORESAL) 10 MG tablet Take 5 mg by mouth at bedtime as needed for muscle spasms.     Historical Provider, MD  desonide (DESOWEN) 0.05 % cream Apply 1 application topically 2 (two) times daily as needed (dry skin).  03/21/14   Historical Provider, MD  fluticasone (CUTIVATE) 0.05 % cream Apply 1 application topically 2 (two) times daily as needed (dry skin).  03/21/14   Historical Provider, MD   BP 157/73 mmHg  Pulse  97  Temp(Src) 98.1 F (36.7 C) (Oral)  Resp 18  SpO2 97% Physical Exam  Constitutional: He is oriented to person, place, and time. He appears well-developed and well-nourished.  HENT:  Head: Normocephalic and atraumatic.  Right Ear: External ear normal.  Left Ear: External ear normal.  Nose: Nose normal.  Mouth/Throat: Oropharynx is clear and moist. No oropharyngeal exudate.  Eyes: Conjunctivae and EOM are normal. Pupils are equal, round, and reactive to light.  Neck: Normal range of motion. Neck supple.  Cardiovascular:  Normal rate, regular rhythm, normal heart sounds and intact distal pulses.  Exam reveals no gallop and no friction rub.   No murmur heard. Pulmonary/Chest: Effort normal and breath sounds normal. No respiratory distress. He has no wheezes.  Abdominal: Soft. Bowel sounds are normal. He exhibits no distension. There is no tenderness. There is no rebound and no guarding.  Genitourinary:     Hard stool was noted in the rectal vault. The patient was manually disimpacted with removal of all hard stool in the rectal vault. Brown-appearing stool.  Musculoskeletal: Normal range of motion. He exhibits no edema or tenderness.  Neurological: He is alert and oriented to person, place, and time.  Skin: Skin is warm and dry.  Psychiatric: He has a normal mood and affect. His behavior is normal.  Nursing note and vitals reviewed.   ED Course  Fecal disimpaction Date/Time: 10/03/2014 9:21 PM Performed by: Purvis SheffieldHARRISON, Brysun Eschmann Authorized by: Purvis SheffieldHARRISON, Sohrab Keelan Consent: Verbal consent obtained. Written consent not obtained. Risks and benefits: risks, benefits and alternatives were discussed Consent given by: patient Patient understanding: patient states understanding of the procedure being performed Required items: required blood products, implants, devices, and special equipment available Patient identity confirmed: verbally with patient, arm band, provided demographic data and hospital-assigned identification number Time out: Immediately prior to procedure a "time out" was called to verify the correct patient, procedure, equipment, support staff and site/side marked as required. Preparation: Patient was prepped and draped in the usual sterile fashion. Local anesthesia used: no Patient sedated: no Patient tolerance: Patient tolerated the procedure well with no immediate complications   (including critical care time) Labs Review Labs Reviewed  CBC WITH DIFFERENTIAL - Abnormal; Notable for the following:     WBC 18.0 (*)    RBC 3.90 (*)    Hemoglobin 11.9 (*)    HCT 35.4 (*)    Neutrophils Relative % 89 (*)    Neutro Abs 16.1 (*)    Lymphocytes Relative 5 (*)    All other components within normal limits  URINALYSIS, ROUTINE W REFLEX MICROSCOPIC - Abnormal; Notable for the following:    Ketones, ur 15 (*)    All other components within normal limits  I-STAT CHEM 8, ED - Abnormal; Notable for the following:    Sodium 131 (*)    Glucose, Bld 110 (*)    Calcium, Ion 1.09 (*)    Hemoglobin 12.9 (*)    HCT 38.0 (*)    All other components within normal limits    Imaging Review No results found.   EKG Interpretation None      MDM   Final diagnoses:  External hemorrhoids  Other constipation    9:19 PM 80 y.o. male w hx of HTN, MI, CVA who presents with rectal pain and constipation. The patient was seen here recently after a fall and was found to have 2 rib fractures. He was sent home on tramadol. He states that he has become constipated from the  tramadol and has not had a bowel movement in 3 days. He states that he has a history of hemorrhoids which are currently inflamed. He states that he is unable to have a bowel movement due to the constipation and rectal pain during Valsalva. He is afebrile and vital signs are remarkable here. He denies any abdominal pain, fever, or vomiting. His abdomen is soft and benign. The family also notes the patient felt like he had to urinate earlier but nothing came out. We'll perform postvoid residual.  The patient was rectally disimpacted with relief of his symptoms. He did have 2 hemorrhoids which were enlarged. Will recommend sitz baths, it is all, and Mira lax. His post void residual showed around 100 mL. I then had him urinate again and he was able to urinate with some out. I do not think he needs a Foley catheter. He is urinating without difficulty here. Lab work was ordered prior to my evaluation. The patient was found to have a elevated white  count which is nonspecific. I again questioned him about abdominal pain and he denies this stating he only has rectal pain related to hemorrhoids. His abdomen remained soft and benign upon reexamination at discharge. He is feeling better on exam after the disimpaction.   11:05 PM:  I have discussed the diagnosis/risks/treatment options with the patient and believe the pt to be eligible for discharge home to follow-up with his pcp or general surgery as needed for his hemorrhoids. We also discussed returning to the ED immediately if new or worsening sx occur. We discussed the sx which are most concerning (e.g., abd pain, worsening rectal pain, fever, vomiting, worsening constipation) that necessitate immediate return. Medications administered to the patient during their visit and any new prescriptions provided to the patient are listed below.  Medications given during this visit Medications - No data to display  New Prescriptions   HYDROCORTISONE (ANUSOL-HC) 2.5 % RECTAL CREAM    Apply rectally 2 times daily   POLYETHYLENE GLYCOL (MIRALAX / GLYCOLAX) PACKET    Take 17 g by mouth daily.     Purvis Sheffield, MD 10/03/14 2325

## 2014-10-03 NOTE — ED Notes (Signed)
MD at bedside. 

## 2014-10-03 NOTE — ED Notes (Signed)
[facility] Expected date:  Expected time:  Means of arrival:  Comments: Ems, hemmorhoids

## 2014-10-04 ENCOUNTER — Encounter (HOSPITAL_COMMUNITY): Payer: Medicare Other

## 2014-10-04 ENCOUNTER — Ambulatory Visit: Payer: Medicare Other | Admitting: Cardiovascular Disease

## 2014-10-15 ENCOUNTER — Ambulatory Visit (HOSPITAL_COMMUNITY)
Admission: RE | Admit: 2014-10-15 | Discharge: 2014-10-15 | Disposition: A | Discharge status: Home / Self Care | Payer: Medicare Other | Source: Ambulatory Visit | Attending: Cardiovascular Disease | Admitting: Cardiovascular Disease

## 2014-10-15 DIAGNOSIS — I679 Cerebrovascular disease, unspecified: Secondary | ICD-10-CM

## 2014-10-15 DIAGNOSIS — I6529 Occlusion and stenosis of unspecified carotid artery: Secondary | ICD-10-CM

## 2014-10-15 DIAGNOSIS — I6521 Occlusion and stenosis of right carotid artery: Secondary | ICD-10-CM | POA: Diagnosis present

## 2014-10-15 NOTE — Progress Notes (Signed)
Carotid Duplex Completed. °Brianna L Mazza,RVT °

## 2014-10-29 ENCOUNTER — Telehealth: Payer: Self-pay | Admitting: Cardiovascular Disease

## 2014-10-29 NOTE — Telephone Encounter (Signed)
[facility]Dr Casimiro NeedleMichael Altheimer) for appointment on 12/17/14 with Dr Allyson SabalBerry.  [facility] for Dr Hazle CocaBerry's schedule on 12/17/14. lp

## 2014-11-06 ENCOUNTER — Encounter: Payer: Self-pay | Admitting: Cardiovascular Disease

## 2014-12-17 ENCOUNTER — Encounter: Payer: Self-pay | Admitting: Cardiovascular Disease

## 2014-12-17 ENCOUNTER — Ambulatory Visit (INDEPENDENT_AMBULATORY_CARE_PROVIDER_SITE_OTHER): Payer: Medicare Other | Admitting: Cardiovascular Disease

## 2014-12-17 VITALS — BP 128/58 | HR 73 | Ht 67.0 in | Wt 129.7 lb

## 2014-12-17 DIAGNOSIS — I480 Paroxysmal atrial fibrillation: Secondary | ICD-10-CM

## 2014-12-17 DIAGNOSIS — I2510 Atherosclerotic heart disease of native coronary artery without angina pectoris: Secondary | ICD-10-CM

## 2014-12-17 DIAGNOSIS — I739 Peripheral vascular disease, unspecified: Secondary | ICD-10-CM

## 2014-12-17 DIAGNOSIS — E785 Hyperlipidemia, unspecified: Secondary | ICD-10-CM

## 2014-12-17 DIAGNOSIS — I10 Essential (primary) hypertension: Secondary | ICD-10-CM

## 2014-12-17 NOTE — Assessment & Plan Note (Signed)
History of hypertension with blood pressure measured today at 128/58. He is on amlodipine and Lopressor. Continue current meds at current dosing

## 2014-12-17 NOTE — Assessment & Plan Note (Signed)
History of PAF maintaining sinus rhythm. 

## 2014-12-17 NOTE — Assessment & Plan Note (Signed)
Moderate right internal carotid artery stenosis by duplex ultrasound recently checked in December. He is neurologically asymptomatic. Given his age and comorbidities we decided not to continue following this despite noninvasive means.

## 2014-12-17 NOTE — Assessment & Plan Note (Signed)
History of hyperlipidemia on atorvastatin 20 mg a day followed by his PCP 

## 2014-12-17 NOTE — Progress Notes (Signed)
12/17/2014 [facility]OLQUIN   [birth year]  [id number]  Primary Physician Junious Silk, MD Primary Cardiologist: Runell Gess MD Roseanne Reno   HPI:  [name] is a 81 year old thin and frail appearing widowed Caucasian male father of 3 children, grandfather of a grandchildren who is accompanied by one of his daughters today. I last saw him approximately 3 years ago. His wife passed away approximately 13 years ago. He lives in an assisted care facility. He has a history of CAD status post RCA stenting back in 1998 and again in 2006 by Dr. Elsie Lincoln. His problems include treated hypertension and dyslipidemia as well as moderate carotid disease by duplex ultrasound right greater than left. He remains neurologically asymptomatic. His last Myoview stress test performed 08/31/10 was low risk. He denies chest pain or shortness of breath.   Current Outpatient Prescriptions  Medication Sig Dispense Refill  . amLODipine (NORVASC) 5 MG tablet Take 5 mg by mouth daily.    Marland Kitchen atorvastatin (LIPITOR) 20 MG tablet Take 20 mg by mouth at bedtime.     . baclofen (LIORESAL) 10 MG tablet Take 5 mg by mouth at bedtime as needed for muscle spasms.     Marland Kitchen desonide (DESOWEN) 0.05 % cream Apply 1 application topically 2 (two) times daily as needed (dry skin).     . fluticasone (CUTIVATE) 0.05 % cream Apply 1 application topically 2 (two) times daily as needed (dry skin).     . metoprolol tartrate (LOPRESSOR) 25 MG tablet Take 25 mg by mouth 2 (two) times daily.    . pantoprazole (PROTONIX) 40 MG tablet Take 40 mg by mouth at bedtime.     . traMADol (ULTRAM) 50 MG tablet Take 1 tablet (50 mg total) by mouth every 6 (six) hours as needed. 30 tablet 0   No current facility-administered medications for this visit.    Allergies  Allergen Reactions  . Penicillins     hives    History   Social History  . Marital Status: Widowed    Spouse Name: N/A    Number of Children: N/A  . Years of  Education: N/A   Occupational History  . Not on file.   Social History Main Topics  . Smoking status: Never Smoker   . Smokeless tobacco: Never Used  . Alcohol Use: No  . Drug Use: No  . Sexual Activity: Not on file   Other Topics Concern  . Not on file   Social History Narrative     Review of Systems: General: negative for chills, fever, night sweats or weight changes.  Cardiovascular: negative for chest pain, dyspnea on exertion, edema, orthopnea, palpitations, paroxysmal nocturnal dyspnea or shortness of breath Dermatological: negative for rash Respiratory: negative for cough or wheezing Urologic: negative for hematuria Abdominal: negative for nausea, vomiting, diarrhea, bright red blood per rectum, melena, or hematemesis Neurologic: negative for visual changes, syncope, or dizziness All other systems reviewed and are otherwise negative except as noted above.    Blood pressure 128/58, pulse 73, height  (1.702 m), weight 129 lb 11.2 oz (58.832 kg).  General appearance: alert and no distress Neck: no adenopathy, no carotid bruit, no JVD, supple, symmetrical, trachea midline and thyroid not enlarged, symmetric, no tenderness/mass/nodules Lungs: clear to auscultation bilaterally Heart: regular rate and rhythm, S1, S2 normal, no murmur, click, rub or gallop Extremities: extremities normal, atraumatic, no cyanosis or edema  EKG normal sinus rhythm with the development of left bundle branch block rate  73. This is new since his prior EKG. I personally reviewed this EKG.  ASSESSMENT AND PLAN:   PVD, moderate RICA disease Moderate right internal carotid artery stenosis by duplex ultrasound recently checked in December. He is neurologically asymptomatic. Given his age and comorbidities we decided not to continue following this despite noninvasive means.   PAF (paroxysmal atrial fibrillation) History of PAF maintaining sinus rhythm   HTN (hypertension) History of  hypertension with blood pressure measured today at 128/58. He is on amlodipine and Lopressor. Continue current meds at current dosing   Dyslipidemia History of hyperlipidemia on atorvastatin 20 mg a day followed by his PCP   CAD, RCA PTCA '98, RCA DES '06. Low risk Nuc 2011 History of CAD status post remote intervention by Dr. Elsie LincolnGamble in 1998 and again in 2006 of his RCA. His last Myoview performed 08/31/10 was low risk. He denies chest pain or shortness of breath.       Runell GessJonathan J. Kier Smead MD FACP,FACC,FAHA, [facility] 12/17/2014 2:24 PM

## 2014-12-17 NOTE — Assessment & Plan Note (Signed)
History of CAD status post remote intervention by Dr. Elsie LincolnGamble in 1998 and again in 2006 of his RCA. His last Myoview performed 08/31/10 was low risk. He denies chest pain or shortness of breath.

## 2014-12-17 NOTE — Patient Instructions (Signed)
Your physician wants you to follow-up in: 1 Year. You will receive a reminder letter in the mail two months in advance. If you don't receive a letter, please call our office to schedule the follow-up appointment.  

## 2015-03-13 ENCOUNTER — Telehealth (HOSPITAL_COMMUNITY): Payer: Self-pay | Admitting: *Deleted

## 2015-03-14 ENCOUNTER — Encounter (HOSPITAL_COMMUNITY): Payer: Self-pay | Admitting: Emergency Medicine

## 2015-03-14 ENCOUNTER — Emergency Department (HOSPITAL_COMMUNITY)
Admission: EM | Admit: 2015-03-14 | Discharge: 2015-03-14 | Disposition: A | Discharge status: Home / Self Care | Payer: Medicare Other | Attending: Emergency Medicine | Admitting: Emergency Medicine

## 2015-03-14 DIAGNOSIS — I252 Old myocardial infarction: Secondary | ICD-10-CM | POA: Insufficient documentation

## 2015-03-14 DIAGNOSIS — I2510 Atherosclerotic heart disease of native coronary artery without angina pectoris: Secondary | ICD-10-CM | POA: Insufficient documentation

## 2015-03-14 DIAGNOSIS — I10 Essential (primary) hypertension: Secondary | ICD-10-CM | POA: Insufficient documentation

## 2015-03-14 DIAGNOSIS — Z043 Encounter for examination and observation following other accident: Secondary | ICD-10-CM | POA: Diagnosis not present

## 2015-03-14 DIAGNOSIS — Z8673 Personal history of transient ischemic attack (TIA), and cerebral infarction without residual deficits: Secondary | ICD-10-CM | POA: Diagnosis not present

## 2015-03-14 DIAGNOSIS — Y9301 Activity, walking, marching and hiking: Secondary | ICD-10-CM | POA: Diagnosis not present

## 2015-03-14 DIAGNOSIS — Y92192 Bathroom in other specified residential institution as the place of occurrence of the external cause: Secondary | ICD-10-CM | POA: Diagnosis not present

## 2015-03-14 DIAGNOSIS — W01198A Fall on same level from slipping, tripping and stumbling with subsequent striking against other object, initial encounter: Secondary | ICD-10-CM | POA: Diagnosis not present

## 2015-03-14 DIAGNOSIS — Z79899 Other long term (current) drug therapy: Secondary | ICD-10-CM | POA: Diagnosis not present

## 2015-03-14 DIAGNOSIS — Y998 Other external cause status: Secondary | ICD-10-CM | POA: Diagnosis not present

## 2015-03-14 DIAGNOSIS — W19XXXA Unspecified fall, initial encounter: Secondary | ICD-10-CM

## 2015-03-14 DIAGNOSIS — Z880 Allergy status to penicillin: Secondary | ICD-10-CM | POA: Diagnosis not present

## 2015-03-14 DIAGNOSIS — E785 Hyperlipidemia, unspecified: Secondary | ICD-10-CM | POA: Insufficient documentation

## 2015-03-14 DIAGNOSIS — Z9861 Coronary angioplasty status: Secondary | ICD-10-CM | POA: Diagnosis not present

## 2015-03-14 NOTE — Discharge Instructions (Signed)

## 2015-03-14 NOTE — ED Notes (Signed)
Pt able to ambulate in hall with walker and one assist.

## 2015-03-14 NOTE — ED Notes (Signed)
Pt alert at baseline mae randomly.

## 2015-03-14 NOTE — ED Notes (Signed)
Per EMS-fell 45 minutes, right sided deficit from stroke, fell backwards in bathroom, no LOC, no complaints of pain-no deformities, no bruising-facility sent here per policy for evaluation

## 2015-03-14 NOTE — ED Provider Notes (Signed)
CSN: [id number]     Arrival date & time 03/14/15  0945 History   First MD Initiated Contact with Patient 03/14/15 [id number]     Chief Complaint  Patient presents with  . Fall     (Consider location/radiation/quality/duration/timing/severity/associated sxs/prior Treatment) HPI John Montgomery is a 81 y.o. male who presents here via EMS from his assisted living facility who comes in for evaluation after fall. Hx of previous CVA with left sided weakness. Patient states approximately 9:00 this morning he was in the bathroom, started to walk backwards and lost his balance and continued to fall backwards he reports he hit his head on the floor. He denies any loss of consciousness, pain, headache, nausea, vomiting, new numbness or weakness. No anticoagulation.  Past Medical History  Diagnosis Date  . Hypertension   . Hyperlipidemia   . Myocardial infarction   . Stroke 2013  . CAD (coronary artery disease)     stents placed by Dr Elsie LincolnGamble  . History of stress test 08/2010    Low risk study  . Carotid disease, bilateral     revealing moderate right and mild left ICA stenosis.  Marland Kitchen. Hx of echocardiogram 12/2009    Showed good LV function   Past Surgical History  Procedure Laterality Date  . Angioplasty  2006    RCA stenting by Dr Elsie LincolnGamble   Family History  Problem Relation Age of Onset  . Kidney disease Mother    History  Substance Use Topics  . Smoking status: Never Smoker   . Smokeless tobacco: Never Used  . Alcohol Use: No    Review of Systems A 10 point review of systems was completed and was negative except for pertinent positives and negatives as mentioned in the history of present illness    Allergies  Penicillins  Home Medications   Prior to Admission medications   Medication Sig Start Date End Date Taking? Authorizing Provider  amLODipine (NORVASC) 5 MG tablet Take 5 mg by mouth daily.   Yes Historical Provider, MD  atorvastatin (LIPITOR) 20 MG tablet Take 20 mg by mouth at  bedtime.    Yes Historical Provider, MD  baclofen (LIORESAL) 10 MG tablet Take 5 mg by mouth at bedtime as needed for muscle spasms.    Yes Historical Provider, MD  beta carotene w/minerals (OCUVITE) tablet Take 1 tablet by mouth daily.   Yes Historical Provider, MD  Cholecalciferol 2000 UNITS TABS Take 1 tablet by mouth daily.   Yes Historical Provider, MD  desonide (DESOWEN) 0.05 % cream Apply 1 application topically 2 (two) times daily as needed (dry skin).  03/21/14  Yes Historical Provider, MD  fluticasone (CUTIVATE) 0.05 % cream Apply 1 application topically 2 (two) times daily as needed (dry skin).  03/21/14  Yes Historical Provider, MD  metoprolol tartrate (LOPRESSOR) 25 MG tablet Take 12.5 mg by mouth 2 (two) times daily.    Yes Historical Provider, MD  pantoprazole (PROTONIX) 40 MG tablet Take 40 mg by mouth at bedtime.    Yes Historical Provider, MD  traMADol (ULTRAM) 50 MG tablet Take 1 tablet (50 mg total) by mouth every 6 (six) hours as needed. Patient taking differently: Take 50 mg by mouth every 6 (six) hours as needed for moderate pain or severe pain.  09/21/14  Yes Linwood DibblesJon Knapp, MD   BP 139/40 mmHg  Pulse 55  Temp(Src) 97.6 F (36.4 C) (Oral)  Resp 20  SpO2 99% Physical Exam  Constitutional: He is oriented to person, place,  and time. He appears well-developed and well-nourished. No distress.  HENT:  Head: Normocephalic and atraumatic.  Mouth/Throat: Oropharynx is clear and moist. No oropharyngeal exudate.  No tenderness to palpation of calvarium. No depressions or other evidence of trauma noted.  Eyes: Conjunctivae are normal. Pupils are equal, round, and reactive to light. Right eye exhibits no discharge. Left eye exhibits no discharge. No scleral icterus.  Neck: Neck supple.  Cardiovascular: Normal rate, regular rhythm and normal heart sounds.   Pulmonary/Chest: Effort normal and breath sounds normal. No respiratory distress. He has no wheezes. He has no rales.  Abdominal:  Soft. There is no tenderness.  Musculoskeletal: Normal range of motion. He exhibits no edema or tenderness.  Maintains full active range of motion of cervical, thoracic and lumbar spine without discomfort. No midline bony tenderness.  Neurological: He is alert and oriented to person, place, and time.  Cranial Nerves II-XII grossly intact. Baseline motor strength and sensation is intact. Grip strength equal and intact bilaterally. Completes finger to nose, heel to shin coordination movements without difficulty.  Skin: Skin is warm and dry. No rash noted. He is not diaphoretic.  Psychiatric: He has a normal mood and affect.  Nursing note and vitals reviewed.   ED Course  Procedures (including critical care time) Labs Review Labs Reviewed - No data to display  Imaging Review No results found.   EKG Interpretation None     Meds given in ED:  Medications - No data to display  Discharge Medication List as of 03/14/2015 10:57 AM     Filed Vitals:   03/14/15 0958 03/14/15 1030 03/14/15 1135  BP: 142/69 142/36 139/40  Pulse: 61 55   Temp: 97.6 F (36.4 C)    TempSrc: Oral    Resp: 20    SpO2: 100% 99%     MDM  Vitals stable - WNL -afebrile Pt resting comfortably in ED. Patient and family at bedside defer head CT at this time. They prefer to have patient return home to his assisted living facility. PE-normal neuro exam. Grossly benign physical exam.ambulation with walker and assistance at baseline.  DDX--patient without any gross normality's on physical exam. Appears well and at baseline per family in the room. Family and patient wish to return back to living facility.  I discussed all relevant lab findings and imaging results with pt and they verbalized understanding. Discussed f/u with PCP within 48 hrs and return precautions, pt very amenable to plan. Prior to patient discharge, I discussed and reviewed this case with Dr. Adriana Simas   Final diagnoses:  Fall, initial encounter         Joycie Peek, PA-C 03/14/15 1711  Donnetta Hutching, MD 03/15/15 [phone]

## 2015-03-14 NOTE — ED Notes (Signed)
Bed: [facility]19 Expected date:  Expected time:  Means of arrival:  Comments: EMS-fall 

## 2015-03-17 ENCOUNTER — Ambulatory Visit: Payer: Medicare Other | Admitting: Podiatry

## 2015-03-20 ENCOUNTER — Telehealth (HOSPITAL_COMMUNITY): Payer: Self-pay | Admitting: *Deleted

## 2015-03-21 ENCOUNTER — Other Ambulatory Visit: Payer: Self-pay | Admitting: Cardiovascular Disease

## 2015-03-21 ENCOUNTER — Telehealth (HOSPITAL_COMMUNITY): Payer: Self-pay | Admitting: *Deleted

## 2015-03-21 DIAGNOSIS — I6529 Occlusion and stenosis of unspecified carotid artery: Secondary | ICD-10-CM

## 2015-03-31 ENCOUNTER — Ambulatory Visit (HOSPITAL_COMMUNITY)
Admission: RE | Admit: 2015-03-31 | Discharge: 2015-03-31 | Disposition: A | Discharge status: Home / Self Care | Payer: Medicare Other | Source: Ambulatory Visit | Attending: Cardiology | Admitting: Cardiology

## 2015-03-31 DIAGNOSIS — I6523 Occlusion and stenosis of bilateral carotid arteries: Secondary | ICD-10-CM | POA: Diagnosis not present

## 2015-03-31 DIAGNOSIS — I6529 Occlusion and stenosis of unspecified carotid artery: Secondary | ICD-10-CM

## 2015-03-31 NOTE — Progress Notes (Signed)
Carotid Duplex Completed. Stable right ICA 50-69% and left ICA WNL. John Montgomery, BS, RDMS, RVT

## 2015-04-08 ENCOUNTER — Telehealth: Payer: Self-pay | Admitting: Cardiovascular Disease

## 2015-04-08 NOTE — Telephone Encounter (Signed)
Would like pt's doppler results from 03-31-15 please.

## 2015-04-08 NOTE — Telephone Encounter (Signed)
Advised results mailed to patient, no chg from prior study.

## 2015-07-01 ENCOUNTER — Encounter (HOSPITAL_COMMUNITY): Payer: Self-pay

## 2015-07-01 ENCOUNTER — Emergency Department (HOSPITAL_COMMUNITY)
Admission: EM | Admit: 2015-07-01 | Discharge: 2015-07-01 | Disposition: A | Discharge status: Home / Self Care | Payer: Medicare Other | Attending: Emergency Medicine | Admitting: Emergency Medicine

## 2015-07-01 DIAGNOSIS — M62838 Other muscle spasm: Secondary | ICD-10-CM

## 2015-07-01 DIAGNOSIS — Z8673 Personal history of transient ischemic attack (TIA), and cerebral infarction without residual deficits: Secondary | ICD-10-CM | POA: Diagnosis not present

## 2015-07-01 DIAGNOSIS — I2510 Atherosclerotic heart disease of native coronary artery without angina pectoris: Secondary | ICD-10-CM | POA: Insufficient documentation

## 2015-07-01 DIAGNOSIS — I252 Old myocardial infarction: Secondary | ICD-10-CM | POA: Insufficient documentation

## 2015-07-01 DIAGNOSIS — I10 Essential (primary) hypertension: Secondary | ICD-10-CM | POA: Insufficient documentation

## 2015-07-01 DIAGNOSIS — Z79899 Other long term (current) drug therapy: Secondary | ICD-10-CM | POA: Diagnosis not present

## 2015-07-01 DIAGNOSIS — Z9861 Coronary angioplasty status: Secondary | ICD-10-CM | POA: Diagnosis not present

## 2015-07-01 DIAGNOSIS — M79651 Pain in right thigh: Secondary | ICD-10-CM | POA: Diagnosis present

## 2015-07-01 DIAGNOSIS — Z7951 Long term (current) use of inhaled steroids: Secondary | ICD-10-CM | POA: Insufficient documentation

## 2015-07-01 DIAGNOSIS — E785 Hyperlipidemia, unspecified: Secondary | ICD-10-CM | POA: Diagnosis not present

## 2015-07-01 DIAGNOSIS — Z880 Allergy status to penicillin: Secondary | ICD-10-CM | POA: Diagnosis not present

## 2015-07-01 MED ORDER — IBUPROFEN 200 MG PO TABS
200.0000 mg | ORAL_TABLET | Freq: Once | ORAL | Status: AC
  Administered 2015-07-01: 200 mg via ORAL
  Filled 2015-07-01: qty 1

## 2015-07-01 MED ORDER — TRAMADOL HCL 50 MG PO TABS: 50.0000 mg | ORAL_TABLET | Freq: Four times a day (QID) | ORAL | Status: DC | PRN

## 2015-07-01 MED ORDER — DIAZEPAM 2 MG PO TABS: 2.0000 mg | ORAL_TABLET | Freq: Three times a day (TID) | ORAL | Status: DC | PRN

## 2015-07-01 MED ORDER — LORAZEPAM 0.5 MG PO TABS
0.5000 mg | ORAL_TABLET | Freq: Once | ORAL | Status: AC
  Administered 2015-07-01: 0.5 mg via ORAL
  Filled 2015-07-01: qty 1

## 2015-07-01 MED ORDER — TRAMADOL HCL 50 MG PO TABS: 50.0000 mg | ORAL_TABLET | Freq: Three times a day (TID) | ORAL | Status: DC | PRN

## 2015-07-01 MED ORDER — DIAZEPAM 2 MG PO TABS: 5.0000 mg | ORAL_TABLET | Freq: Three times a day (TID) | ORAL | Status: DC | PRN

## 2015-07-01 MED ORDER — OXYCODONE-ACETAMINOPHEN 5-325 MG PO TABS
1.0000 | ORAL_TABLET | Freq: Once | ORAL | Status: AC
  Administered 2015-07-01: 1 via ORAL
  Filled 2015-07-01: qty 1

## 2015-07-01 NOTE — ED Notes (Signed)
Bed: [facility]10 Expected date:  Expected time:  Means of arrival:  Comments: EMS- 81yo M, R leg/hip pain x 3 years

## 2015-07-01 NOTE — ED Notes (Signed)
Patient was knocked down by a dog about 3 years ago and sustained an injury to the right leg. Patient states that it has been paining him for about 3 weeks now and that the pain was unbearable on today. Patient denies recent injury and is from a skilled nursing facility.

## 2015-07-01 NOTE — Discharge Instructions (Signed)

## 2015-07-01 NOTE — ED Notes (Signed)
Report givaen to Hillery Aldo at [facility]

## 2015-07-01 NOTE — ED Notes (Signed)
[facility] called for transportation back to [facility]

## 2015-07-01 NOTE — ED Provider Notes (Signed)
CSN: [id number]     Arrival date & time 07/01/15  4098 History   First MD Initiated Contact with Patient 07/01/15 [phone]     Chief Complaint  Patient presents with  . Leg Pain     (Consider location/radiation/quality/duration/timing/severity/associated sxs/prior Treatment) HPI   81 year old male with right leg pain. Patient has a past history of persistent right leg pain after being knocked to the ground by a dog approximately 3 years ago. Pain is progressively worsening over the last 2 weeks. Denies any trauma. No acute numbness, tingling or loss of strength. No rash. No swelling. No fevers or chills. No respiratory complaints.91  Past Medical History  Diagnosis Date  . Hypertension   . Hyperlipidemia   . Myocardial infarction   . Stroke 2013  . CAD (coronary artery disease)     stents placed by Dr Elsie Lincoln  . History of stress test 08/2010    Low risk study  . Carotid disease, bilateral     revealing moderate right and mild left ICA stenosis.  Marland Kitchen Hx of echocardiogram 12/2009    Showed good LV function   Past Surgical History  Procedure Laterality Date  . Angioplasty  2006    RCA stenting by Dr Elsie Lincoln   Family History  Problem Relation Age of Onset  . Kidney disease Mother    Social History  Substance Use Topics  . Smoking status: Never Smoker   . Smokeless tobacco: Never Used  . Alcohol Use: No    Review of Systems    Allergies  Penicillins  Home Medications   Prior to Admission medications   Medication Sig Start Date End Date Taking? Authorizing Provider  amLODipine (NORVASC) 5 MG tablet Take 5 mg by mouth daily.   Yes Historical Provider, MD  atorvastatin (LIPITOR) 20 MG tablet Take 20 mg by mouth at bedtime.    Yes Historical Provider, MD  baclofen (LIORESAL) 10 MG tablet Take 5 mg by mouth at bedtime as needed for muscle spasms.    Yes Historical Provider, MD  beta carotene w/minerals (OCUVITE) tablet Take 1 tablet by mouth daily.   Yes Historical Provider,  MD  Cholecalciferol 2000 UNITS TABS Take 1 tablet by mouth daily.   Yes Historical Provider, MD  desonide (DESOWEN) 0.05 % cream Apply 1 application topically 2 (two) times daily as needed (dry skin).  03/21/14  Yes Historical Provider, MD  fluticasone (CUTIVATE) 0.05 % cream Apply 1 application topically 2 (two) times daily as needed (dry skin).  03/21/14  Yes Historical Provider, MD  metoprolol tartrate (LOPRESSOR) 25 MG tablet Take 12.5 mg by mouth 2 (two) times daily.    Yes Historical Provider, MD  pantoprazole (PROTONIX) 40 MG tablet Take 40 mg by mouth at bedtime.    Yes Historical Provider, MD  traMADol (ULTRAM) 50 MG tablet Take 1 tablet (50 mg total) by mouth every 6 (six) hours as needed. Patient taking differently: Take 50 mg by mouth every 6 (six) hours as needed for moderate pain or severe pain.  09/21/14  Yes Linwood Dibbles, MD  diazepam (VALIUM) 2 MG tablet Take 2.5 tablets (5 mg total) by mouth every 8 (eight) hours as needed for muscle spasms. 07/01/15   Raeford Razor, MD  traMADol (ULTRAM) 50 MG tablet Take 1 tablet (50 mg total) by mouth every 6 (six) hours as needed. 07/01/15   Raeford Razor, MD   BP 160/57 mmHg  Pulse 82  Temp(Src) 98.2 F (36.8 C) (Oral)  Resp 18  SpO2 100% Physical Exam  Constitutional: He appears well-developed and well-nourished. No distress.  HENT:  Head: Normocephalic and atraumatic.  Eyes: Conjunctivae are normal. Right eye exhibits no discharge. Left eye exhibits no discharge.  Neck: Neck supple.  Cardiovascular: Normal rate, regular rhythm and normal heart sounds.  Exam reveals no gallop and no friction rub.   No murmur heard. Pulmonary/Chest: Effort normal and breath sounds normal. No respiratory distress.  Abdominal: Soft. He exhibits no distension. There is no tenderness.  Musculoskeletal: He exhibits no edema or tenderness.  Right lower extremity with tenderness of the right lateral thigh. No concerning skin changes. Ashley intact.  Neurological:  He is alert.  Skin: Skin is warm and dry.  Psychiatric: He has a normal mood and affect. His behavior is normal. Thought content normal.  Nursing note and vitals reviewed.   ED Course  Procedures (including critical care time) Labs Review Labs Reviewed - No data to display  Imaging Review No results found. I, Shenique Childers, personally reviewed and evaluated these images and lab results as part of my medical decision-making.   EKG Interpretation None      MDM   Final diagnoses:  Right thigh pain  Muscle spasm        Raeford Razor, MD 07/10/15 [phone]

## 2015-11-26 ENCOUNTER — Inpatient Hospital Stay (HOSPITAL_COMMUNITY)
Admission: EM | Admit: 2015-11-26 | Discharge: 2015-12-03 | DRG: 871 | Disposition: A | Discharge status: Hospice | Payer: Medicare Other | Attending: Internal Medicine | Admitting: Internal Medicine

## 2015-11-26 DIAGNOSIS — E785 Hyperlipidemia, unspecified: Secondary | ICD-10-CM | POA: Diagnosis present

## 2015-11-26 DIAGNOSIS — I739 Peripheral vascular disease, unspecified: Secondary | ICD-10-CM | POA: Diagnosis present

## 2015-11-26 DIAGNOSIS — R112 Nausea with vomiting, unspecified: Secondary | ICD-10-CM | POA: Diagnosis present

## 2015-11-26 DIAGNOSIS — A419 Sepsis, unspecified organism: Principal | ICD-10-CM | POA: Diagnosis present

## 2015-11-26 DIAGNOSIS — J9601 Acute respiratory failure with hypoxia: Secondary | ICD-10-CM | POA: Diagnosis present

## 2015-11-26 DIAGNOSIS — Z66 Do not resuscitate: Secondary | ICD-10-CM | POA: Insufficient documentation

## 2015-11-26 DIAGNOSIS — I252 Old myocardial infarction: Secondary | ICD-10-CM

## 2015-11-26 DIAGNOSIS — I10 Essential (primary) hypertension: Secondary | ICD-10-CM | POA: Diagnosis present

## 2015-11-26 DIAGNOSIS — R197 Diarrhea, unspecified: Secondary | ICD-10-CM

## 2015-11-26 DIAGNOSIS — R0600 Dyspnea, unspecified: Secondary | ICD-10-CM | POA: Insufficient documentation

## 2015-11-26 DIAGNOSIS — J181 Lobar pneumonia, unspecified organism: Secondary | ICD-10-CM | POA: Diagnosis present

## 2015-11-26 DIAGNOSIS — I129 Hypertensive chronic kidney disease with stage 1 through stage 4 chronic kidney disease, or unspecified chronic kidney disease: Secondary | ICD-10-CM | POA: Diagnosis present

## 2015-11-26 DIAGNOSIS — Z515 Encounter for palliative care: Secondary | ICD-10-CM | POA: Diagnosis present

## 2015-11-26 DIAGNOSIS — N179 Acute kidney failure, unspecified: Secondary | ICD-10-CM | POA: Diagnosis present

## 2015-11-26 DIAGNOSIS — R05 Cough: Secondary | ICD-10-CM | POA: Diagnosis present

## 2015-11-26 DIAGNOSIS — Z993 Dependence on wheelchair: Secondary | ICD-10-CM

## 2015-11-26 DIAGNOSIS — E162 Hypoglycemia, unspecified: Secondary | ICD-10-CM | POA: Diagnosis present

## 2015-11-26 DIAGNOSIS — S065X9A Traumatic subdural hemorrhage with loss of consciousness of unspecified duration, initial encounter: Secondary | ICD-10-CM | POA: Diagnosis present

## 2015-11-26 DIAGNOSIS — A084 Viral intestinal infection, unspecified: Secondary | ICD-10-CM | POA: Diagnosis present

## 2015-11-26 DIAGNOSIS — R059 Cough, unspecified: Secondary | ICD-10-CM | POA: Diagnosis present

## 2015-11-26 DIAGNOSIS — E860 Dehydration: Secondary | ICD-10-CM | POA: Diagnosis present

## 2015-11-26 DIAGNOSIS — J690 Pneumonitis due to inhalation of food and vomit: Secondary | ICD-10-CM | POA: Diagnosis present

## 2015-11-26 DIAGNOSIS — G9341 Metabolic encephalopathy: Secondary | ICD-10-CM | POA: Diagnosis present

## 2015-11-26 DIAGNOSIS — S065XAA Traumatic subdural hemorrhage with loss of consciousness status unknown, initial encounter: Secondary | ICD-10-CM | POA: Diagnosis present

## 2015-11-26 DIAGNOSIS — I5032 Chronic diastolic (congestive) heart failure: Secondary | ICD-10-CM | POA: Diagnosis present

## 2015-11-26 DIAGNOSIS — Z841 Family history of disorders of kidney and ureter: Secondary | ICD-10-CM

## 2015-11-26 DIAGNOSIS — N182 Chronic kidney disease, stage 2 (mild): Secondary | ICD-10-CM | POA: Diagnosis present

## 2015-11-26 DIAGNOSIS — N189 Chronic kidney disease, unspecified: Secondary | ICD-10-CM

## 2015-11-26 DIAGNOSIS — I5189 Other ill-defined heart diseases: Secondary | ICD-10-CM | POA: Diagnosis present

## 2015-11-26 DIAGNOSIS — I69351 Hemiplegia and hemiparesis following cerebral infarction affecting right dominant side: Secondary | ICD-10-CM

## 2015-11-26 DIAGNOSIS — I2510 Atherosclerotic heart disease of native coronary artery without angina pectoris: Secondary | ICD-10-CM | POA: Diagnosis present

## 2015-11-26 DIAGNOSIS — R1110 Vomiting, unspecified: Secondary | ICD-10-CM

## 2015-11-26 DIAGNOSIS — I480 Paroxysmal atrial fibrillation: Secondary | ICD-10-CM | POA: Diagnosis present

## 2015-11-26 DIAGNOSIS — Z955 Presence of coronary angioplasty implant and graft: Secondary | ICD-10-CM

## 2015-11-26 DIAGNOSIS — R1313 Dysphagia, pharyngeal phase: Secondary | ICD-10-CM | POA: Diagnosis present

## 2015-11-26 DIAGNOSIS — K219 Gastro-esophageal reflux disease without esophagitis: Secondary | ICD-10-CM | POA: Diagnosis present

## 2015-11-26 DIAGNOSIS — E861 Hypovolemia: Secondary | ICD-10-CM | POA: Diagnosis present

## 2015-11-26 DIAGNOSIS — G9340 Encephalopathy, unspecified: Secondary | ICD-10-CM

## 2015-11-26 DIAGNOSIS — I6523 Occlusion and stenosis of bilateral carotid arteries: Secondary | ICD-10-CM | POA: Diagnosis present

## 2015-11-26 DIAGNOSIS — R0602 Shortness of breath: Secondary | ICD-10-CM | POA: Insufficient documentation

## 2015-11-26 HISTORY — DX: Chronic diastolic (congestive) heart failure: I50.32

## 2015-11-26 HISTORY — DX: Chronic kidney disease, stage 2 (mild): N18.2

## 2015-11-26 LAB — COMPREHENSIVE METABOLIC PANEL
ALBUMIN: 4.2 g/dL (ref 3.5–5.0)
ALK PHOS: 65 U/L (ref 38–126)
ALT: 18 U/L (ref 17–63)
ANION GAP: 9 (ref 5–15)
AST: 28 U/L (ref 15–41)
BILIRUBIN TOTAL: 1 mg/dL (ref 0.3–1.2)
BUN: 25 mg/dL — ABNORMAL HIGH (ref 6–20)
CO2: 23 mmol/L (ref 22–32)
Calcium: 8.9 mg/dL (ref 8.9–10.3)
Chloride: 107 mmol/L (ref 101–111)
Creatinine, Ser: 1.44 mg/dL — ABNORMAL HIGH (ref 0.61–1.24)
GFR calc Af Amer: 47 mL/min — ABNORMAL LOW (ref >60)
GFR calc non Af Amer: 41 mL/min — ABNORMAL LOW (ref >60)
GLUCOSE: 118 mg/dL — AB (ref 65–99)
Potassium: 3.9 mmol/L (ref 3.5–5.1)
Sodium: 139 mmol/L (ref 135–145)
Total Protein: 6.8 g/dL (ref 6.5–8.1)

## 2015-11-26 LAB — CBC WITH DIFFERENTIAL/PLATELET
BASOS ABS: 0 10*3/uL (ref 0.0–0.1)
BASOS PCT: 0 %
Eosinophils Absolute: 0 10*3/uL (ref 0.0–0.7)
Eosinophils Relative: 0 %
HCT: 38.8 % — ABNORMAL LOW (ref 39.0–52.0)
HEMOGLOBIN: 13 g/dL (ref 13.0–17.0)
Lymphocytes Relative: 5 %
Lymphs Abs: 0.7 10*3/uL (ref 0.7–4.0)
MCH: 31.2 pg (ref 26.0–34.0)
MCHC: 33.5 g/dL (ref 30.0–36.0)
MCV: 93 fL (ref 78.0–100.0)
MONOS PCT: 6 %
Monocytes Absolute: 0.9 10*3/uL (ref 0.1–1.0)
NEUTROS ABS: 13.1 10*3/uL — AB (ref 1.7–7.7)
Neutrophils Relative %: 89 %
Platelets: 178 10*3/uL (ref 150–400)
RBC: 4.17 MIL/uL — ABNORMAL LOW (ref 4.22–5.81)
RDW: 12.7 % (ref 11.5–15.5)
WBC: 14.7 10*3/uL — ABNORMAL HIGH (ref 4.0–10.5)

## 2015-11-26 LAB — I-STAT CG4 LACTIC ACID, ED: Lactic Acid, Venous: 3.05 mmol/L (ref 0.5–2.0)

## 2015-11-26 LAB — I-STAT TROPONIN, ED: Troponin i, poc: 0.01 ng/mL (ref 0.00–0.08)

## 2015-11-26 LAB — LIPASE, BLOOD: Lipase: 29 U/L (ref 11–51)

## 2015-11-26 MED ORDER — DIAZEPAM 5 MG/ML IJ SOLN
5.0000 mg | Freq: Once | INTRAMUSCULAR | Status: AC
  Administered 2015-11-26: 5 mg via INTRAVENOUS
  Filled 2015-11-26: qty 2

## 2015-11-26 MED ORDER — SODIUM CHLORIDE 0.9 % IV BOLUS (SEPSIS)
1000.0000 mL | Freq: Once | INTRAVENOUS | Status: AC
Start: 2015-11-26 — End: 2015-11-27
  Administered 2015-11-26: 1000 mL via INTRAVENOUS

## 2015-11-26 MED ORDER — SODIUM CHLORIDE 0.9 % IV SOLN
Freq: Once | INTRAVENOUS | Status: AC
  Administered 2015-11-27: via INTRAVENOUS

## 2015-11-26 NOTE — ED Provider Notes (Signed)
CSN: [id number]     Arrival date & time 11/26/15  2026 History   First MD Initiated Contact with Patient 11/26/15 2051     Chief Complaint  Patient presents with  . Emesis  . Diarrhea     (Consider location/radiation/quality/duration/timing/severity/associated sxs/prior Treatment) HPI Comments: 82 year old male with history of hypertension, hyperlipidemia, CAD presents for nausea, vomiting, diarrhea. The patient is from a nursing facility. He reportedly had multiple episodes of vomiting and diarrhea and was unable to eat dinner this evening. Patient reports not feeling well and states that he has ongoing issues with spasms in his right leg that are also acting up.   Past Medical History  Diagnosis Date  . Hypertension   . Hyperlipidemia   . Myocardial infarction (HCC)   . Stroke [facility]) 2013  . CAD (coronary artery disease)     stents placed by Dr Elsie Lincoln  . History of stress test 08/2010    Low risk study  . Carotid disease, bilateral (HCC)     revealing moderate right and mild left ICA stenosis.  Marland Kitchen Hx of echocardiogram 12/2009    Showed good LV function   Past Surgical History  Procedure Laterality Date  . Angioplasty  2006    RCA stenting by Dr Elsie Lincoln   Family History  Problem Relation Age of Onset  . Kidney disease Mother    Social History  Substance Use Topics  . Smoking status: Never Smoker   . Smokeless tobacco: Never Used  . Alcohol Use: No    Review of Systems  Constitutional: Positive for appetite change. Negative for fever.  HENT: Negative for congestion, rhinorrhea and sinus pressure.   Respiratory: Negative for cough, chest tightness and shortness of breath.   Cardiovascular: Negative for chest pain and palpitations.  Gastrointestinal: Positive for nausea, vomiting, abdominal pain and diarrhea.  Genitourinary: Negative for dysuria, urgency and hematuria.  Musculoskeletal: Positive for myalgias (right thigh). Negative for back pain.  Skin: Negative for  rash.  Neurological: Negative for dizziness, weakness and headaches.  Hematological: Does not bruise/bleed easily.      Allergies  Penicillins  Home Medications   Prior to Admission medications   Medication Sig Start Date End Date Taking? Authorizing Provider  amLODipine (NORVASC) 5 MG tablet Take 5 mg by mouth daily.   Yes Historical Provider, MD  atorvastatin (LIPITOR) 20 MG tablet Take 20 mg by mouth at bedtime.    Yes Historical Provider, MD  baclofen (LIORESAL) 10 MG tablet Take 5 mg by mouth 2 (two) times daily as needed for muscle spasms.    Yes Historical Provider, MD  beta carotene w/minerals (OCUVITE) tablet Take 1 tablet by mouth daily.   Yes Historical Provider, MD  Cholecalciferol 2000 UNITS TABS Take 1 tablet by mouth daily.   Yes Historical Provider, MD  desonide (DESOWEN) 0.05 % cream Apply 1 application topically 2 (two) times daily as needed (dry skin).  03/21/14  Yes Historical Provider, MD  diazepam (VALIUM) 2 MG tablet Take 1 tablet (2 mg total) by mouth every 8 (eight) hours as needed for muscle spasms. 07/01/15  Yes Raeford Razor, MD  fluticasone (CUTIVATE) 0.05 % cream Apply 1 application topically 2 (two) times daily as needed (dry skin).  03/21/14  Yes Historical Provider, MD  metoprolol tartrate (LOPRESSOR) 25 MG tablet Take 12.5 mg by mouth 2 (two) times daily.    Yes Historical Provider, MD  Neomycin-Bacitracin-Polymyxin (TRIPLE ANTIBIOTIC EX) Apply 1 application topically 2 (two) times daily as needed (skin  tear).   Yes Historical Provider, MD  pantoprazole (PROTONIX) 40 MG tablet Take 40 mg by mouth at bedtime.    Yes Historical Provider, MD  polyethylene glycol (MIRALAX / GLYCOLAX) packet Take 17 g by mouth daily as needed for mild constipation or moderate constipation.   Yes Historical Provider, MD  sodium fluoride (PREVIDENT 5000 PLUS) 1.1 % CREA dental cream Place 1 application onto teeth daily.   Yes Historical Provider, MD  traMADol (ULTRAM) 50 MG tablet  Take 1 tablet (50 mg total) by mouth 3 (three) times daily as needed. 07/01/15  Yes Raeford RazorStephen Kohut, MD   BP 130/59 mmHg  Pulse 105  Resp 18  SpO2 93% Physical Exam  Constitutional: He appears well-developed and well-nourished. No distress.  HENT:  Head: Normocephalic and atraumatic.  Right Ear: External ear normal.  Left Ear: External ear normal.  Mouth/Throat: Oropharynx is clear and moist. No oropharyngeal exudate.  Eyes: EOM are normal. Pupils are equal, round, and reactive to light.  Neck: Normal range of motion. Neck supple.  Cardiovascular: Normal rate, regular rhythm, normal heart sounds and intact distal pulses.   No murmur heard. Pulmonary/Chest: Effort normal. No respiratory distress. He has no wheezes. He has no rales.  Abdominal: Soft. He exhibits no distension and no mass. There is no tenderness. There is no guarding.  Musculoskeletal: He exhibits no edema.  Neurological: He is alert.  Skin: Skin is warm and dry. No rash noted. He is not diaphoretic.  Vitals reviewed.   ED Course  Procedures (including critical care time) Labs Review Labs Reviewed  CBC WITH DIFFERENTIAL/PLATELET - Abnormal; Notable for the following:    WBC 14.7 (*)    RBC 4.17 (*)    HCT 38.8 (*)    Neutro Abs 13.1 (*)    All other components within normal limits  COMPREHENSIVE METABOLIC PANEL - Abnormal; Notable for the following:    Glucose, Bld 118 (*)    BUN 25 (*)    Creatinine, Ser 1.44 (*)    GFR calc non Af Amer 41 (*)    GFR calc Af Amer 47 (*)    All other components within normal limits  URINALYSIS, ROUTINE W REFLEX MICROSCOPIC (NOT [facility]) - Abnormal; Notable for the following:    APPearance CLOUDY (*)    Hgb urine dipstick TRACE (*)    Protein, ur 30 (*)    All other components within normal limits  URINE MICROSCOPIC-ADD ON - Abnormal; Notable for the following:    Squamous Epithelial / LPF 0-5 (*)    Bacteria, UA RARE (*)    Casts HYALINE CASTS (*)    All other components  within normal limits  I-STAT CG4 LACTIC ACID, ED - Abnormal; Notable for the following:    Lactic Acid, Venous 3.05 (*)    All other components within normal limits  C DIFFICILE QUICK SCREEN W PCR REFLEX  LIPASE, BLOOD  I-STAT TROPOININ, ED  I-STAT CG4 LACTIC ACID, ED    Imaging Review No results found. I have personally reviewed and evaluated these images and lab results as part of my medical decision-making.   EKG Interpretation   Date/Time:  Wednesday November 26 2015 21:01:06 EST Ventricular Rate:  98 PR Interval:  195 QRS Duration: 132 QT Interval:  374 QTC Calculation: 477 R Axis:   42 Text Interpretation:  Sinus or ectopic atrial rhythm Left bundle branch  block New LBBB Confirmed by Lannis Lichtenwalner ([id number]) on 11/26/2015 9:20:25 PM  MDM  Patient seen and evaluated in stable condition. Laboratory results with leukocytosis and elevated lactic acid. Mildly elevated Cr. Patient was given fluids for hydration. Case was signed out to night team pending CT abdomen and pelvis for further evaluation of possible abdominal pathology with plan for admission. Final diagnoses:  None    1. Acute nausea and vomiting    Leta Baptist, MD 11/27/15 [phone]

## 2015-11-26 NOTE — ED Notes (Signed)
Pt on cardiac monitor.

## 2015-11-26 NOTE — ED Notes (Signed)
EKG given to EDP,Messner,MD., for review. 

## 2015-11-26 NOTE — ED Notes (Addendum)
Call Greggory StallionGeorge at [phone] with updates

## 2015-11-26 NOTE — ED Notes (Signed)
[facility] presents with a patient from [facility] with nausea, vomiting and diarrhea since this evening.  Pt has had 5 episodes of vomiting and 3 episodes of diarrhea and hasn't eaten dinner today because of episodes.  Pt was given 4 mg of Zofran en route with a 500 ml bag of NS and also c/o right leg spasms.  No other c/o pain.

## 2015-11-26 NOTE — ED Notes (Signed)
Notified nurse and edp results from istat lactic acid

## 2015-11-26 NOTE — ED Notes (Signed)
Bed: WA07 Expected date:  Expected time:  Means of arrival:  Comments: 78M n/v after eating dinner

## 2015-11-27 ENCOUNTER — Inpatient Hospital Stay (HOSPITAL_COMMUNITY): Payer: Medicare Other

## 2015-11-27 ENCOUNTER — Emergency Department (HOSPITAL_COMMUNITY): Payer: Medicare Other

## 2015-11-27 ENCOUNTER — Encounter (HOSPITAL_COMMUNITY): Payer: Self-pay

## 2015-11-27 DIAGNOSIS — Z515 Encounter for palliative care: Secondary | ICD-10-CM | POA: Diagnosis present

## 2015-11-27 DIAGNOSIS — J690 Pneumonitis due to inhalation of food and vomit: Secondary | ICD-10-CM | POA: Diagnosis present

## 2015-11-27 DIAGNOSIS — N179 Acute kidney failure, unspecified: Secondary | ICD-10-CM | POA: Diagnosis present

## 2015-11-27 DIAGNOSIS — N182 Chronic kidney disease, stage 2 (mild): Secondary | ICD-10-CM | POA: Diagnosis present

## 2015-11-27 DIAGNOSIS — I69351 Hemiplegia and hemiparesis following cerebral infarction affecting right dominant side: Secondary | ICD-10-CM | POA: Diagnosis not present

## 2015-11-27 DIAGNOSIS — J9601 Acute respiratory failure with hypoxia: Secondary | ICD-10-CM | POA: Diagnosis not present

## 2015-11-27 DIAGNOSIS — Z66 Do not resuscitate: Secondary | ICD-10-CM | POA: Diagnosis present

## 2015-11-27 DIAGNOSIS — A419 Sepsis, unspecified organism: Secondary | ICD-10-CM | POA: Diagnosis present

## 2015-11-27 DIAGNOSIS — G9341 Metabolic encephalopathy: Secondary | ICD-10-CM | POA: Diagnosis present

## 2015-11-27 DIAGNOSIS — I6523 Occlusion and stenosis of bilateral carotid arteries: Secondary | ICD-10-CM | POA: Diagnosis present

## 2015-11-27 DIAGNOSIS — R112 Nausea with vomiting, unspecified: Secondary | ICD-10-CM | POA: Diagnosis not present

## 2015-11-27 DIAGNOSIS — I519 Heart disease, unspecified: Secondary | ICD-10-CM

## 2015-11-27 DIAGNOSIS — R197 Diarrhea, unspecified: Secondary | ICD-10-CM | POA: Diagnosis not present

## 2015-11-27 DIAGNOSIS — A084 Viral intestinal infection, unspecified: Secondary | ICD-10-CM | POA: Diagnosis present

## 2015-11-27 DIAGNOSIS — Z841 Family history of disorders of kidney and ureter: Secondary | ICD-10-CM | POA: Diagnosis not present

## 2015-11-27 DIAGNOSIS — I739 Peripheral vascular disease, unspecified: Secondary | ICD-10-CM | POA: Diagnosis present

## 2015-11-27 DIAGNOSIS — I10 Essential (primary) hypertension: Secondary | ICD-10-CM

## 2015-11-27 DIAGNOSIS — N189 Chronic kidney disease, unspecified: Secondary | ICD-10-CM

## 2015-11-27 DIAGNOSIS — E785 Hyperlipidemia, unspecified: Secondary | ICD-10-CM | POA: Diagnosis present

## 2015-11-27 DIAGNOSIS — I480 Paroxysmal atrial fibrillation: Secondary | ICD-10-CM | POA: Diagnosis not present

## 2015-11-27 DIAGNOSIS — R0600 Dyspnea, unspecified: Secondary | ICD-10-CM | POA: Diagnosis not present

## 2015-11-27 DIAGNOSIS — I5032 Chronic diastolic (congestive) heart failure: Secondary | ICD-10-CM | POA: Diagnosis present

## 2015-11-27 DIAGNOSIS — G9340 Encephalopathy, unspecified: Secondary | ICD-10-CM

## 2015-11-27 DIAGNOSIS — E861 Hypovolemia: Secondary | ICD-10-CM | POA: Diagnosis present

## 2015-11-27 DIAGNOSIS — I252 Old myocardial infarction: Secondary | ICD-10-CM | POA: Diagnosis not present

## 2015-11-27 DIAGNOSIS — R059 Cough, unspecified: Secondary | ICD-10-CM | POA: Diagnosis present

## 2015-11-27 DIAGNOSIS — Z955 Presence of coronary angioplasty implant and graft: Secondary | ICD-10-CM | POA: Diagnosis not present

## 2015-11-27 DIAGNOSIS — J189 Pneumonia, unspecified organism: Secondary | ICD-10-CM | POA: Diagnosis not present

## 2015-11-27 DIAGNOSIS — R0602 Shortness of breath: Secondary | ICD-10-CM | POA: Diagnosis not present

## 2015-11-27 DIAGNOSIS — R05 Cough: Secondary | ICD-10-CM | POA: Diagnosis present

## 2015-11-27 DIAGNOSIS — E860 Dehydration: Secondary | ICD-10-CM | POA: Diagnosis present

## 2015-11-27 DIAGNOSIS — I2510 Atherosclerotic heart disease of native coronary artery without angina pectoris: Secondary | ICD-10-CM | POA: Diagnosis present

## 2015-11-27 DIAGNOSIS — R1313 Dysphagia, pharyngeal phase: Secondary | ICD-10-CM | POA: Diagnosis present

## 2015-11-27 DIAGNOSIS — I129 Hypertensive chronic kidney disease with stage 1 through stage 4 chronic kidney disease, or unspecified chronic kidney disease: Secondary | ICD-10-CM | POA: Diagnosis present

## 2015-11-27 DIAGNOSIS — Z993 Dependence on wheelchair: Secondary | ICD-10-CM | POA: Diagnosis not present

## 2015-11-27 DIAGNOSIS — K219 Gastro-esophageal reflux disease without esophagitis: Secondary | ICD-10-CM | POA: Diagnosis present

## 2015-11-27 DIAGNOSIS — E162 Hypoglycemia, unspecified: Secondary | ICD-10-CM | POA: Diagnosis present

## 2015-11-27 DIAGNOSIS — J181 Lobar pneumonia, unspecified organism: Secondary | ICD-10-CM | POA: Diagnosis not present

## 2015-11-27 LAB — C DIFFICILE QUICK SCREEN W PCR REFLEX
C Diff antigen: NEGATIVE
C Diff interpretation: NEGATIVE
C Diff toxin: NEGATIVE

## 2015-11-27 LAB — INFLUENZA PANEL BY PCR (TYPE A & B)
H1N1FLUPCR: NOT DETECTED
INFLBPCR: NEGATIVE
Influenza A By PCR: NEGATIVE

## 2015-11-27 LAB — COMPREHENSIVE METABOLIC PANEL
ALBUMIN: 3.3 g/dL — AB (ref 3.5–5.0)
ALK PHOS: 49 U/L (ref 38–126)
ALT: 19 U/L (ref 17–63)
ANION GAP: 10 (ref 5–15)
AST: 42 U/L — ABNORMAL HIGH (ref 15–41)
BUN: 25 mg/dL — ABNORMAL HIGH (ref 6–20)
CHLORIDE: 110 mmol/L (ref 101–111)
CO2: 19 mmol/L — AB (ref 22–32)
Calcium: 8.3 mg/dL — ABNORMAL LOW (ref 8.9–10.3)
Creatinine, Ser: 1.28 mg/dL — ABNORMAL HIGH (ref 0.61–1.24)
GFR calc non Af Amer: 47 mL/min — ABNORMAL LOW (ref >60)
GFR, EST AFRICAN AMERICAN: 54 mL/min — AB (ref >60)
GLUCOSE: 122 mg/dL — AB (ref 65–99)
POTASSIUM: 3.7 mmol/L (ref 3.5–5.1)
SODIUM: 139 mmol/L (ref 135–145)
Total Bilirubin: 0.9 mg/dL (ref 0.3–1.2)
Total Protein: 5.7 g/dL — ABNORMAL LOW (ref 6.5–8.1)

## 2015-11-27 LAB — URINALYSIS, ROUTINE W REFLEX MICROSCOPIC
Bilirubin Urine: NEGATIVE
Glucose, UA: NEGATIVE mg/dL
Ketones, ur: NEGATIVE mg/dL
Leukocytes, UA: NEGATIVE
NITRITE: NEGATIVE
Protein, ur: 30 mg/dL — AB
SPECIFIC GRAVITY, URINE: 1.018 (ref 1.005–1.030)
pH: 6 (ref 5.0–8.0)

## 2015-11-27 LAB — CBC
HEMATOCRIT: 33.7 % — AB (ref 39.0–52.0)
HEMOGLOBIN: 11.3 g/dL — AB (ref 13.0–17.0)
MCH: 31.1 pg (ref 26.0–34.0)
MCHC: 33.5 g/dL (ref 30.0–36.0)
MCV: 92.8 fL (ref 78.0–100.0)
Platelets: 163 10*3/uL (ref 150–400)
RBC: 3.63 MIL/uL — AB (ref 4.22–5.81)
RDW: 12.8 % (ref 11.5–15.5)
WBC: 13.4 10*3/uL — ABNORMAL HIGH (ref 4.0–10.5)

## 2015-11-27 LAB — URINE MICROSCOPIC-ADD ON

## 2015-11-27 LAB — PROTIME-INR
INR: 1.16 (ref 0.00–1.49)
PROTHROMBIN TIME: 15 s (ref 11.6–15.2)

## 2015-11-27 LAB — APTT: aPTT: 29 seconds (ref 24–37)

## 2015-11-27 LAB — I-STAT CG4 LACTIC ACID, ED: LACTIC ACID, VENOUS: 3.79 mmol/L — AB (ref 0.5–2.0)

## 2015-11-27 LAB — LACTIC ACID, PLASMA
LACTIC ACID, VENOUS: 3.1 mmol/L — AB (ref 0.5–2.0)
Lactic Acid, Venous: 4.1 mmol/L (ref 0.5–2.0)
Lactic Acid, Venous: 2.7 mmol/L (ref 0.5–2.0)
Lactic Acid, Venous: 2.1 mmol/L (ref 0.5–2.0)

## 2015-11-27 LAB — BRAIN NATRIURETIC PEPTIDE: B NATRIURETIC PEPTIDE 5: 186 pg/mL — AB (ref 0.0–100.0)

## 2015-11-27 LAB — PROCALCITONIN: PROCALCITONIN: 8.97 ng/mL

## 2015-11-27 LAB — CBG MONITORING, ED: Glucose-Capillary: 120 mg/dL — ABNORMAL HIGH (ref 65–99)

## 2015-11-27 LAB — CREATININE, URINE, RANDOM: CREATININE, URINE: 131.12 mg/dL

## 2015-11-27 LAB — SODIUM, URINE, RANDOM: Sodium, Ur: 112 mmol/L

## 2015-11-27 MED ORDER — HEPARIN SODIUM (PORCINE) 5000 UNIT/ML IJ SOLN
5000.0000 [IU] | Freq: Three times a day (TID) | INTRAMUSCULAR | Status: DC
  Administered 2015-11-27 – 2015-12-03 (×17): 5000 [IU] via SUBCUTANEOUS
  Filled 2015-11-27 (×25): qty 1

## 2015-11-27 MED ORDER — OCUVITE PO TABS
1.0000 | ORAL_TABLET | Freq: Every day | ORAL | Status: DC
  Administered 2015-11-27 – 2015-12-02 (×4): 1 via ORAL
  Filled 2015-11-27 (×7): qty 1

## 2015-11-27 MED ORDER — FLUTICASONE PROPIONATE 0.05 % EX CREA: 1.0000 "application " | TOPICAL_CREAM | Freq: Two times a day (BID) | CUTANEOUS | Status: DC | PRN

## 2015-11-27 MED ORDER — BACLOFEN 5 MG HALF TABLET
5.0000 mg | ORAL_TABLET | Freq: Two times a day (BID) | ORAL | Status: DC | PRN
  Administered 2015-11-28: 5 mg via ORAL
  Filled 2015-11-27 (×2): qty 1

## 2015-11-27 MED ORDER — SODIUM CHLORIDE 0.9 % IV BOLUS (SEPSIS)
500.0000 mL | Freq: Once | INTRAVENOUS | Status: AC
  Administered 2015-11-27: 500 mL via INTRAVENOUS

## 2015-11-27 MED ORDER — SODIUM CHLORIDE 0.9 % IV SOLN
INTRAVENOUS | Status: DC
Start: 2015-11-27 — End: 2015-11-28
  Administered 2015-11-27 (×3): via INTRAVENOUS

## 2015-11-27 MED ORDER — DM-GUAIFENESIN ER 30-600 MG PO TB12
1.0000 | ORAL_TABLET | Freq: Two times a day (BID) | ORAL | Status: DC
  Administered 2015-11-27 – 2015-12-02 (×6): 1 via ORAL
  Filled 2015-11-27 (×16): qty 1

## 2015-11-27 MED ORDER — SODIUM FLUORIDE 1.1 % DT CREA: 1.0000 "application " | TOPICAL_CREAM | Freq: Every day | DENTAL | Status: DC

## 2015-11-27 MED ORDER — SODIUM CHLORIDE 0.9 % IJ SOLN
3.0000 mL | Freq: Two times a day (BID) | INTRAMUSCULAR | Status: DC
  Administered 2015-11-28 – 2015-12-03 (×7): 3 mL via INTRAVENOUS

## 2015-11-27 MED ORDER — DIAZEPAM 5 MG/ML IJ SOLN: 5.0000 mg | Freq: Once | INTRAMUSCULAR | Status: DC

## 2015-11-27 MED ORDER — VITAMIN D 1000 UNITS PO TABS
1000.0000 [IU] | ORAL_TABLET | Freq: Every day | ORAL | Status: DC
  Administered 2015-11-27 – 2015-12-02 (×4): 1000 [IU] via ORAL
  Filled 2015-11-27 (×7): qty 1

## 2015-11-27 MED ORDER — LEVALBUTEROL HCL 1.25 MG/0.5ML IN NEBU: 1.2500 mg | INHALATION_SOLUTION | Freq: Four times a day (QID) | RESPIRATORY_TRACT | Status: DC

## 2015-11-27 MED ORDER — METOPROLOL TARTRATE 12.5 MG HALF TABLET
12.5000 mg | ORAL_TABLET | Freq: Two times a day (BID) | ORAL | Status: DC
  Administered 2015-11-27 – 2015-12-02 (×7): 12.5 mg via ORAL
  Filled 2015-11-27 (×14): qty 1

## 2015-11-27 MED ORDER — PROMETHAZINE HCL 25 MG/ML IJ SOLN
12.5000 mg | Freq: Once | INTRAMUSCULAR | Status: AC
  Administered 2015-11-27: 12.5 mg via INTRAVENOUS
  Filled 2015-11-27: qty 1

## 2015-11-27 MED ORDER — SODIUM CHLORIDE 0.9 % IV BOLUS (SEPSIS)
1500.0000 mL | Freq: Once | INTRAVENOUS | Status: AC
  Administered 2015-11-27: 1000 mL via INTRAVENOUS

## 2015-11-27 MED ORDER — ONDANSETRON HCL 4 MG/2ML IJ SOLN: 4.0000 mg | Freq: Three times a day (TID) | INTRAMUSCULAR | Status: DC | PRN

## 2015-11-27 MED ORDER — ACETAMINOPHEN 325 MG PO TABS
650.0000 mg | ORAL_TABLET | Freq: Four times a day (QID) | ORAL | Status: DC | PRN
  Administered 2015-11-27: 650 mg via ORAL
  Filled 2015-11-27 (×2): qty 2

## 2015-11-27 MED ORDER — ACETAMINOPHEN 650 MG RE SUPP: 650.0000 mg | Freq: Four times a day (QID) | RECTAL | Status: DC | PRN

## 2015-11-27 MED ORDER — IOHEXOL 300 MG/ML  SOLN
100.0000 mL | Freq: Once | INTRAMUSCULAR | Status: AC | PRN
  Administered 2015-11-27: 80 mL via INTRAVENOUS

## 2015-11-27 MED ORDER — DIAZEPAM 2 MG PO TABS
2.0000 mg | ORAL_TABLET | Freq: Three times a day (TID) | ORAL | Status: DC | PRN
  Administered 2015-11-28: 2 mg via ORAL
  Filled 2015-11-27 (×2): qty 1

## 2015-11-27 MED ORDER — CIPROFLOXACIN IN D5W 400 MG/200ML IV SOLN
400.0000 mg | Freq: Two times a day (BID) | INTRAVENOUS | Status: DC
  Administered 2015-11-27 – 2015-11-28 (×2): 400 mg via INTRAVENOUS
  Filled 2015-11-27 (×2): qty 200

## 2015-11-27 MED ORDER — LEVALBUTEROL HCL 1.25 MG/0.5ML IN NEBU
1.2500 mg | INHALATION_SOLUTION | Freq: Four times a day (QID) | RESPIRATORY_TRACT | Status: DC
  Administered 2015-11-27 – 2015-11-28 (×5): 1.25 mg via RESPIRATORY_TRACT
  Filled 2015-11-27 (×8): qty 0.5

## 2015-11-27 MED ORDER — ATORVASTATIN CALCIUM 20 MG PO TABS
20.0000 mg | ORAL_TABLET | Freq: Every day | ORAL | Status: DC
  Administered 2015-11-27 – 2015-11-28 (×2): 20 mg via ORAL
  Filled 2015-11-27: qty 1
  Filled 2015-11-27: qty 2
  Filled 2015-11-27 (×2): qty 1
  Filled 2015-11-27: qty 2
  Filled 2015-11-27 (×2): qty 1
  Filled 2015-11-27: qty 2

## 2015-11-27 MED ORDER — FAMOTIDINE IN NACL 20-0.9 MG/50ML-% IV SOLN
20.0000 mg | Freq: Two times a day (BID) | INTRAVENOUS | Status: DC
  Administered 2015-11-27 – 2015-12-02 (×12): 20 mg via INTRAVENOUS
  Filled 2015-11-27 (×14): qty 50

## 2015-11-27 MED ORDER — CIPROFLOXACIN IN D5W 400 MG/200ML IV SOLN
400.0000 mg | INTRAVENOUS | Status: DC
  Administered 2015-11-27: 400 mg via INTRAVENOUS
  Filled 2015-11-27: qty 200

## 2015-11-27 MED ORDER — TRAMADOL HCL 50 MG PO TABS
50.0000 mg | ORAL_TABLET | Freq: Three times a day (TID) | ORAL | Status: DC | PRN
  Administered 2015-11-27: 50 mg via ORAL
  Filled 2015-11-27: qty 1

## 2015-11-27 MED ORDER — METRONIDAZOLE IN NACL 5-0.79 MG/ML-% IV SOLN
500.0000 mg | Freq: Three times a day (TID) | INTRAVENOUS | Status: DC
  Administered 2015-11-27 – 2015-12-03 (×19): 500 mg via INTRAVENOUS
  Filled 2015-11-27 (×21): qty 100

## 2015-11-27 MED ORDER — TRIPLE ANTIBIOTIC 3.5-400-5000 EX OINT
TOPICAL_OINTMENT | Freq: Two times a day (BID) | CUTANEOUS | Status: DC | PRN
  Filled 2015-11-27: qty 1

## 2015-11-27 MED ORDER — TRIPLE ANTIBIOTIC 3.5-400-5000 EX OINT
1.0000 "application " | TOPICAL_OINTMENT | Freq: Two times a day (BID) | CUTANEOUS | Status: DC | PRN
  Filled 2015-11-27: qty 1

## 2015-11-27 MED ORDER — DIAZEPAM 5 MG PO TABS
5.0000 mg | ORAL_TABLET | Freq: Once | ORAL | Status: AC
  Administered 2015-11-27: 5 mg via ORAL
  Filled 2015-11-27: qty 1

## 2015-11-27 NOTE — ED Notes (Signed)
Pt states "My legs are hurting"; pt appears restless

## 2015-11-27 NOTE — H&P (Addendum)
[facility] History and Physical  FERREL SIMINGTON ZOX:[id number] DOB: 01/18/1921 DOA: 11/26/2015  Referring physician: ED physician PCP: Junious Silk, MD  Specialists:   Chief Complaint: Nausea, vomiting, diarrhea, shortness of breath and AMS  HPI: John Montgomery is a 82 y.o. male with PMH of hypertension, hyperlipidemia, CAD, S/P stent placement, bilateral carotid artery stenosis, chronic kidney disease-II, SDH, PAF, diastolic congestive heart failure, GERD, anxiety, who presents with nausea, vomiting, diarrhea and shortness of breath.  Patient is from nursing home. Patient has AMS. He is a very poor historian and is unable to provide accurate medical history, therefore, most of the history is obtained by discussing the case with ED physician, per EMS report, and with the nursing staff. It seems that patient has been having nausea, vomiting and diarrhea in the past 3 days. Not sure whether patient has abdominal pain or chest pain. He is reportedly to have had 3 bowel movements with loose stool, and vomited 5 times without blood in the vomitus. Patient seems to have shortness of breath. He is not actively coughing. His oxygen saturation decreased to 82% in the emergency room. Patient is confused, his baseline mental status is not known. He seems to move all his extremities.  In ED, patient was found to have lactate is 3.05, negative troponin, BNP 186, WBC 14.7, temperature 99, worsening renal function, negative C diff test, negative urinalysis, negative chest x-ray for acute abnormalities. Patient admitted to inpatient for further eval and treatment.  CT abdomen/pelvis that showed possible gastroenteritis, msenteric edema with multiple small mesenteric lymph nodes, appearance suggestive of "misty mesentery ". This is nonspecific finding and can be seen in the setting of edema secondary to renal failure, heart failure, or hypoalbuminemia; inflammation such as sclerosing mesenteritis or  less likely neoplastic, moderate bilateral inguinal hernias, containing small bowel on the right and sigmoid colon on the left, no bowel inflammation or obstruction. Incidental findings of atherosclerosis, focal scarring and atrophy of the upper right kidney with multiple renal cysts, and hepatic cyst.  EKG: Independently reviewed. QTC 477, left bundle blockade which exhibited a previous EKG on 12/17/14.  Where does patient live?   SNF   Can patient participate in ADLs?   None   Allergy:  Allergies  Allergen Reactions  . Penicillins     hives    Past Medical History  Diagnosis Date  . Hypertension   . Hyperlipidemia   . Myocardial infarction (HCC)   . Stroke [facility]) 2013  . CAD (coronary artery disease)     stents placed by Dr Elsie Lincoln  . History of stress test 08/2010    Low risk study  . Carotid disease, bilateral (HCC)     revealing moderate right and mild left ICA stenosis.  Marland Kitchen Hx of echocardiogram 12/2009    Showed good LV function  . CKD (chronic kidney disease), stage II   . Chronic diastolic (congestive) heart failure [facility])     Past Surgical History  Procedure Laterality Date  . Angioplasty  2006    RCA stenting by Dr Elsie Lincoln    Social History:  reports that he has never smoked. He has never used smokeless tobacco. He reports that he does not drink alcohol or use illicit drugs.  Family History:  Family History  Problem Relation Age of Onset  . Kidney disease Mother      Prior to Admission medications   Medication Sig Start Date End Date Taking? Authorizing Provider  amLODipine (NORVASC) 5 MG tablet Take  5 mg by mouth daily.   Yes Historical Provider, MD  atorvastatin (LIPITOR) 20 MG tablet Take 20 mg by mouth at bedtime.    Yes Historical Provider, MD  baclofen (LIORESAL) 10 MG tablet Take 5 mg by mouth 2 (two) times daily as needed for muscle spasms.    Yes Historical Provider, MD  beta carotene w/minerals (OCUVITE) tablet Take 1 tablet by mouth daily.   Yes  Historical Provider, MD  Cholecalciferol 2000 UNITS TABS Take 1 tablet by mouth daily.   Yes Historical Provider, MD  desonide (DESOWEN) 0.05 % cream Apply 1 application topically 2 (two) times daily as needed (dry skin).  03/21/14  Yes Historical Provider, MD  diazepam (VALIUM) 2 MG tablet Take 1 tablet (2 mg total) by mouth every 8 (eight) hours as needed for muscle spasms. 07/01/15  Yes Raeford Razor, MD  fluticasone (CUTIVATE) 0.05 % cream Apply 1 application topically 2 (two) times daily as needed (dry skin).  03/21/14  Yes Historical Provider, MD  metoprolol tartrate (LOPRESSOR) 25 MG tablet Take 12.5 mg by mouth 2 (two) times daily.    Yes Historical Provider, MD  Neomycin-Bacitracin-Polymyxin (TRIPLE ANTIBIOTIC EX) Apply 1 application topically 2 (two) times daily as needed (skin tear).   Yes Historical Provider, MD  pantoprazole (PROTONIX) 40 MG tablet Take 40 mg by mouth at bedtime.    Yes Historical Provider, MD  polyethylene glycol (MIRALAX / GLYCOLAX) packet Take 17 g by mouth daily as needed for mild constipation or moderate constipation.   Yes Historical Provider, MD  sodium fluoride (PREVIDENT 5000 PLUS) 1.1 % CREA dental cream Place 1 application onto teeth daily.   Yes Historical Provider, MD  traMADol (ULTRAM) 50 MG tablet Take 1 tablet (50 mg total) by mouth 3 (three) times daily as needed. 07/01/15  Yes Raeford Razor, MD    Physical Exam: Filed Vitals:   11/27/15 0501 11/27/15 0531 11/27/15 0547 11/27/15 0600  BP:  140/62  125/50  Pulse:  105  102  Temp: 99 F (37.2 C)     TempSrc: Oral     Resp:  23  21  Height:   5' 6.93" (1.7 m)   Weight:   58.8 kg (129 lb 10.1 oz)   SpO2:  97%  96%   General: Not in acute distress HEENT:       Eyes: PERRL, EOMI, no scleral icterus.       ENT: No discharge from the ears and nose, no pharynx injection, no tonsillar enlargement.        Neck: No JVD, no bruit, no mass felt. Heme: No neck lymph node enlargement. Cardiac: S1/S2, RRR, No  murmurs, No gallops or rubs. Pulm: has mild wheezing bilaterally. No rales, rhonchi or rubs. Abd: Soft, nondistended, nontender, no rebound pain, no organomegaly, BS present. Ext: No leg edema bilaterally. 2+DP/PT pulse bilaterally. Musculoskeletal: No joint deformities, No joint redness or warmth, no limitation of ROM in spin. Skin: No rashes.  Neuro: Alert, confused, not oriented X3, cranial nerves II-XII grossly intact. He moves all extremities Psych: Patient is not psychotic, no suicidal or hemocidal ideation.  Labs on Admission:  Basic Metabolic Panel:  Recent Labs Lab 11/26/15 2225 11/27/15 0446  NA 139 139  K 3.9 3.7  CL 107 110  CO2 23 19*  GLUCOSE 118* 122*  BUN 25* 25*  CREATININE 1.44* 1.28*  CALCIUM 8.9 8.3*   Liver Function Tests:  Recent Labs Lab 11/26/15 2225 11/27/15 0446  AST 28 42*  ALT 18 19  ALKPHOS 65 49  BILITOT 1.0 0.9  PROT 6.8 5.7*  ALBUMIN 4.2 3.3*    Recent Labs Lab 11/26/15 2225  LIPASE 29   No results for input(s): AMMONIA in the last 168 hours. CBC:  Recent Labs Lab 11/26/15 2225 11/27/15 0446  WBC 14.7* 13.4*  NEUTROABS 13.1*  --   HGB 13.0 11.3*  HCT 38.8* 33.7*  MCV 93.0 92.8  PLT 178 163   Cardiac Enzymes: No results for input(s): CKTOTAL, CKMB, CKMBINDEX, TROPONINI in the last 168 hours.  BNP (last 3 results)  Recent Labs  11/27/15 0312  BNP 186.0*    ProBNP (last 3 results) No results for input(s): PROBNP in the last 8760 hours.  CBG: No results for input(s): GLUCAP in the last 168 hours.  Radiological Exams on Admission: Ct Abdomen Pelvis W Contrast  11/27/2015  CLINICAL DATA:  82 year old male with vomiting and diarrhea. EXAM: CT ABDOMEN AND PELVIS WITH CONTRAST TECHNIQUE: Multidetector CT imaging of the abdomen and pelvis was performed using the standard protocol following bolus administration of intravenous contrast. CONTRAST:  80mL OMNIPAQUE IOHEXOL 300 MG/ML  SOLN COMPARISON:  None. FINDINGS: Lower  chest: Right greater than left basilar atelectasis. Coronary artery calcifications are seen. The heart is normal in size. Liver: 1.5 cm cyst in the left lobe adjacent to the left portal vein. No additional focal lesion. Hepatobiliary: Gallbladder physiologically distended, no calcified stone. No biliary dilatation. Pancreas: No ductal dilatation or inflammation. Spleen: Normal. Adrenal glands: No nodule. Kidneys: Symmetric renal enhancement and excretion. No hydronephrosis. There is cortical scarring and atrophy of the upper right kidney. Multiple right renal cysts, largest measuring 4.1 cm. Tiny cortical cyst in the left kidney. Stomach/Bowel: There is gaseous gastric distention. Enteric contrast in the distal esophagus with small hiatal hernia. No bowel obstruction, enteric contrast is reached the colon. There are no dilated or thickened small bowel loops. Bilateral inguinal hernias, containing normal appearing small bowel on the right, and normal appearing sigmoid colon on the left. No bowel inflammation or obstruction secondary to these hernias. Small volume of stool throughout the colon without colonic wall thickening. Portions of the appendix are visualized and are normal. Vascular/Lymphatic: There is mesenteric edema in the central mesentery with associated small mesenteric lymph nodes. Mesenteric vessels remain patent. No retroperitoneal adenopathy. Abdominal aorta is normal in caliber. Moderate to advanced atherosclerosis of the abdominal aorta and its branches. No aneurysm. Reproductive: Prostate gland is prominent size. Bladder: Bladder physiologically distended, no wall thickening. Other: No free air, free fluid, or intra-abdominal fluid collection. Musculoskeletal: Scoliotic curvature and advanced multilevel degenerative change in the spine. Prominent Schmorl's node involving superior endplate of L1. The bones appear under mineralized. There are no acute or suspicious osseous abnormalities. IMPRESSION:  1. Mild gaseous gastric distention with contrast in the distal esophagus. There is no small bowel dilatation, and findings may reflect gastroenteritis. 2. Mesenteric edema with multiple small mesenteric lymph nodes an appearance suggestive of "misty mesentery ". This is nonspecific finding and can be seen in the setting of edema secondary to renal failure, heart failure, or hypoalbuminemia; inflammation such as sclerosing mesenteritis or less likely neoplastic. 3. Moderate bilateral inguinal hernias, containing small bowel on the right and sigmoid colon on the left, no bowel inflammation or obstruction. 4. Incidental findings of atherosclerosis, focal scarring and atrophy of the upper right kidney with multiple renal cysts, and hepatic cyst. Electronically Signed   By: Rubye Oaks M.D.   On: 11/27/2015 02:32  Dg Chest Portable 1 View  11/27/2015  CLINICAL DATA:  82 year old male with diarrhea and shortness of breath EXAM: PORTABLE CHEST 1 VIEW COMPARISON:  Radiograph dated 09/21/2014 FINDINGS: Single-view of the chest does not demonstrate a focal consolidation. There is mild diffuse interstitial prominence possibly a degree of congestion or edema. No pleural effusion or pneumothorax. Stable cardiac silhouette. The osseous structures are grossly unremarkable. IMPRESSION: No focal consolidation. Electronically Signed   By: Elgie CollardArash  Radparvar M.D.   On: 11/27/2015 03:16    Assessment/Plan Principal Problem:   Nausea vomiting and diarrhea Active Problems:   PAF (paroxysmal atrial fibrillation) (HCC)   SDH (subdural hematoma) (HCC)   HTN (hypertension)   CAD, RCA PTCA '98, RCA DES '06. Low risk Nuc 2011   Dyslipidemia   PVD, moderate RICA disease   Diastolic dysfunction, grade 1 with an EF of 50-55% 2011   Acute respiratory failure with hypoxia (HCC)   Cough   Acute-on-chronic kidney injury (HCC)   Sepsis (HCC)   Acute encephalopathy   Nausea vomiting and diarrhea: Etiology is not clear. CT  abdomen/pelvis that showed possible gastroenteritis. It is likely due to viral infection, but cannot rule out bacteria infection. C diff test is negative. Given that patient is septic, will start empiric antibiotics now.  -will admit to SDU -start IV Cipro and Flagyl -IV fluid -When necessary Zofran for nausea -will get pathogen panel pcr  Sepsis: Patient is septic on admission with elevated lactate, tachypnea, tachycardia. Hemodynamically stable. -will get Procalcitonin and trend lactic acid levels per sepsis protocol. -IVF: 3.5L of NS bolus in ED, followed by 75 cc/h   Acute respiratory failure: Etiology is not clear. Chest x-ray is negative for acute abnormalities. Patient does not seem to have severe CHF exacerbation given BNP 186, and lack of leg edema or JVD. She has mild wheezing bilaterally, indicating possible bronchitis. -prn Xopenx -Mucinex of cough -Check flu PCR  Acute encephalopathy: A due to multifactorial, including sepsis, metabolic encephalopathy, hypoxia and worsening renal function -Treat underlying problems -Frequent neurochecks -ct-head  Hx of PAF: CHA2DS2-VASc Score is 5, needs oral anticoagulation. Patient is not on Coumadin at home, possibly due to history of SDH. Heart rate is at ~116.  -continue metoprolol  HTN (hypertension): -Hold amlodipine due to sepsis -Continue metoprolol  CAD: s/p stent. Not sure if pt has CP. -Trop  X 3 -On metroprolol  Diastolic dysfunction: grade 1 with an EF of 50-55% 2011. Patient is not on diuretics at home. No leg edema or JVD. BNP 186, less likely to have severe exacerbation. -Monitor volume status closely while patient is being treated with IV fluid for sepsis -Continue metoprolol  AoCKD-II: Baseline Cre is 1.0, and his creatinine is 1.44, BUN 25 is on admission. Likely due to prerenal vs ATN. No hydronephrosis by CT scan - IVF as above - Check FeNa - Follow up renal function by BMP  GERD: -will switch PPI to  pepcid IV  DVT ppx: SQ Heparin  Code Status: Full code (not sure about the code status, assume full code, need to be clarified in AM) Family Communication: None at bed side. Disposition Plan: Admit to inpatient   Date of Service 11/27/2015    Lorretta HarpIU, Shankar Silber [facility] Pager [phone]  If 7PM-7AM, please contact night-coverage [email or web address] Password [facility] 11/27/2015, 6:43 AM

## 2015-11-27 NOTE — Progress Notes (Signed)
[facility] PROGRESS NOTE  John Montgomery ZOX:[id number] DOB: Aug 05, 1921 DOA: 11/26/2015 PCP: Junious Silk, MD  Brief narrative 82 year old male with history of hypertension, hyperlipidemia, CAD status post stent, bilateral carotid artery stenosis, see daily stage II, history of subdural hematoma, paroxysmal A. fib, diastolic CHF, GERD, anxiety presented to skilled nursing facility presented with acute onset of nausea, vomiting, diarrhea shortness of breath. Patient was also found to be encephalopathic on presentation. Reportedly patient was having nausea with vomiting and diarrhea for the past 3 days and had some shortness of breath. His oxygen saturation decreased to 82% in the ED. Was found to have elevated lactate of 2.05, BNP of 186, leukocytosis with WBC of 14.7 and low-grade fever of 90 and 80 Fahrenheit with acute on chronic kidney injury. Chest x-ray, UA and stool for C. difficile were negative. CT of his abdomen pelvis showed mild gaseous gastric distention with mesenteric edema with multiple small mesenteric lymph nodes. Ultrasound showed moderate bilateral inguinal hernias containing small bowel and colon without inflammation or obstruction. Admitted for further management.   Assessment/Plan: Sepsis with acute encephalopathy Possibly associated with acute viral gastritis/gastroenteritis. Stool for C. difficile negative. Started on empiric IV Cipro and Flagyl. Continue IV hydration. Continue antiemetics. Patient's mental status improving. Monitor lactic acid. Continue maintenance fluid (2.5 L normal saline bolus in the ED) Will start on full liquid. -Admit to medical floor. Ordered PT.  Acute hypoxic respiratory failure No clear etiology. Possibly associated with sepsis. No signs of volume overload. Patient was wheezy on exam and possibly had acute bronchitis. Continue when necessary nebs and antitussives. Flu PCR negative.  Essential hypertension Continue metoprolol.  Amlodipine held due to soft blood pressure is sepsis.  Coronary artery disease History of stent. Continue metoprolol.  Diastolic dysfunction Patient hypovolemic on presentation. Receiving maintenance fluid. Continue beta blocker.  Acute on chronic kidney disease stage II Secondary to sepsis and dehydration. Monitor with IV fluids.  GERD On Pepcid  Diet: Full liquid DVT prophylaxis  Code Status: Full code Family Communication: None at bedside Disposition Plan: Admit to telemetry   Consultants:  None  Procedures:  Head CT  CT abdomen and pelvis  Antibiotics:  Cipro and Flagyl  HPI/Subjective: Seen and examined. Patient appears alert and oriented. Reports having vomiting and diarrhea yesterday. Does not recall eventually.  Objective: Filed Vitals:   11/27/15 1048 11/27/15 1053  BP: 130/63 130/63  Pulse: 104 105  Temp:  99 F (37.2 C)  Resp:  22    Intake/Output Summary (Last 24 hours) at 11/27/15 1151 Last data filed at 11/27/15 1110  Gross per 24 hour  Intake      0 ml  Output      1 ml  Net     -1 ml   Filed Weights   11/27/15 0547  Weight: 58.8 kg (129 lb 10.1 oz)    Exam:   General:  Elderly male appears fatigued, not in distress  HEENT: No pallor, supple neck  Chest: Scattered rhonchi bilaterally,   Cardiovascular: Normal S1 and S2, no murmurs or gallop  GI: Soft, nondistended, nontender, bowel sounds present  Musculoskeletal: Warm, no edema   CNS: Alert and oriented  Data Reviewed: Basic Metabolic Panel:  Recent Labs Lab 11/26/15 2225 11/27/15 0446  NA 139 139  K 3.9 3.7  CL 107 110  CO2 23 19*  GLUCOSE 118* 122*  BUN 25* 25*  CREATININE 1.44* 1.28*  CALCIUM 8.9 8.3*   Liver Function Tests:  Recent Labs  Lab 11/26/15 2225 11/27/15 0446  AST 28 42*  ALT 18 19  ALKPHOS 65 49  BILITOT 1.0 0.9  PROT 6.8 5.7*  ALBUMIN 4.2 3.3*    Recent Labs Lab 11/26/15 2225  LIPASE 29   No results for input(s): AMMONIA in  the last 168 hours. CBC:  Recent Labs Lab 11/26/15 2225 11/27/15 0446  WBC 14.7* 13.4*  NEUTROABS 13.1*  --   HGB 13.0 11.3*  HCT 38.8* 33.7*  MCV 93.0 92.8  PLT 178 163   Cardiac Enzymes: No results for input(s): CKTOTAL, CKMB, CKMBINDEX, TROPONINI in the last 168 hours. BNP (last 3 results)  Recent Labs  11/27/15 0312  BNP 186.0*    ProBNP (last 3 results) No results for input(s): PROBNP in the last 8760 hours.  CBG:  Recent Labs Lab 11/27/15 0752  GLUCAP 120*    Recent Results (from the past 240 hour(s))  C difficile quick scan w PCR reflex     Status: None   Collection Time: 11/27/15 12:07 AM  Result Value Ref Range Status   C Diff antigen NEGATIVE NEGATIVE Final   C Diff toxin NEGATIVE NEGATIVE Final   C Diff interpretation Negative for toxigenic C. difficile  Final     Studies: Ct Head Wo Contrast  11/27/2015  CLINICAL DATA:  Altered mental status EXAM: CT HEAD WITHOUT CONTRAST TECHNIQUE: Contiguous axial images were obtained from the base of the skull through the vertex without intravenous contrast. COMPARISON:  02/28/2013 FINDINGS: Global atrophy. Chronic ischemic changes in the periventricular white matter. No mass effect, midline shift, or acute hemorrhage. Tiny lacunar infarct in the right caudate head is stable. The cranium is intact. IMPRESSION: No acute intracranial pathology. Electronically Signed   By: Jolaine ClickArthur  Hoss M.D.   On: 11/27/2015 07:34   Ct Abdomen Pelvis W Contrast  11/27/2015  CLINICAL DATA:  82 year old male with vomiting and diarrhea. EXAM: CT ABDOMEN AND PELVIS WITH CONTRAST TECHNIQUE: Multidetector CT imaging of the abdomen and pelvis was performed using the standard protocol following bolus administration of intravenous contrast. CONTRAST:  80mL OMNIPAQUE IOHEXOL 300 MG/ML  SOLN COMPARISON:  None. FINDINGS: Lower chest: Right greater than left basilar atelectasis. Coronary artery calcifications are seen. The heart is normal in size.  Liver: 1.5 cm cyst in the left lobe adjacent to the left portal vein. No additional focal lesion. Hepatobiliary: Gallbladder physiologically distended, no calcified stone. No biliary dilatation. Pancreas: No ductal dilatation or inflammation. Spleen: Normal. Adrenal glands: No nodule. Kidneys: Symmetric renal enhancement and excretion. No hydronephrosis. There is cortical scarring and atrophy of the upper right kidney. Multiple right renal cysts, largest measuring 4.1 cm. Tiny cortical cyst in the left kidney. Stomach/Bowel: There is gaseous gastric distention. Enteric contrast in the distal esophagus with small hiatal hernia. No bowel obstruction, enteric contrast is reached the colon. There are no dilated or thickened small bowel loops. Bilateral inguinal hernias, containing normal appearing small bowel on the right, and normal appearing sigmoid colon on the left. No bowel inflammation or obstruction secondary to these hernias. Small volume of stool throughout the colon without colonic wall thickening. Portions of the appendix are visualized and are normal. Vascular/Lymphatic: There is mesenteric edema in the central mesentery with associated small mesenteric lymph nodes. Mesenteric vessels remain patent. No retroperitoneal adenopathy. Abdominal aorta is normal in caliber. Moderate to advanced atherosclerosis of the abdominal aorta and its branches. No aneurysm. Reproductive: Prostate gland is prominent size. Bladder: Bladder physiologically distended, no wall thickening. Other: No free  air, free fluid, or intra-abdominal fluid collection. Musculoskeletal: Scoliotic curvature and advanced multilevel degenerative change in the spine. Prominent Schmorl's node involving superior endplate of L1. The bones appear under mineralized. There are no acute or suspicious osseous abnormalities. IMPRESSION: 1. Mild gaseous gastric distention with contrast in the distal esophagus. There is no small bowel dilatation, and findings  may reflect gastroenteritis. 2. Mesenteric edema with multiple small mesenteric lymph nodes an appearance suggestive of "misty mesentery ". This is nonspecific finding and can be seen in the setting of edema secondary to renal failure, heart failure, or hypoalbuminemia; inflammation such as sclerosing mesenteritis or less likely neoplastic. 3. Moderate bilateral inguinal hernias, containing small bowel on the right and sigmoid colon on the left, no bowel inflammation or obstruction. 4. Incidental findings of atherosclerosis, focal scarring and atrophy of the upper right kidney with multiple renal cysts, and hepatic cyst. Electronically Signed   By: Rubye Oaks M.D.   On: 11/27/2015 02:32   Dg Chest Portable 1 View  11/27/2015  CLINICAL DATA:  82 year old male with diarrhea and shortness of breath EXAM: PORTABLE CHEST 1 VIEW COMPARISON:  Radiograph dated 09/21/2014 FINDINGS: Single-view of the chest does not demonstrate a focal consolidation. There is mild diffuse interstitial prominence possibly a degree of congestion or edema. No pleural effusion or pneumothorax. Stable cardiac silhouette. The osseous structures are grossly unremarkable. IMPRESSION: No focal consolidation. Electronically Signed   By: Elgie Collard M.D.   On: 11/27/2015 03:16    Scheduled Meds: . atorvastatin  20 mg Oral QHS  . beta carotene w/minerals  1 tablet Oral Daily  . cholecalciferol  1,000 Units Oral Daily  . dextromethorphan-guaiFENesin  1 tablet Oral BID  . heparin  5,000 Units Subcutaneous 3 times per day  . levalbuterol  1.25 mg Nebulization QID  . metoprolol tartrate  12.5 mg Oral BID  . sodium chloride  3 mL Intravenous Q12H  . sodium fluoride  1 application dental Daily   Continuous Infusions: . sodium chloride 75 mL/hr at 11/27/15 0557  . ciprofloxacin    . famotidine (PEPCID) IV 20 mg (11/27/15 1055)  . metronidazole Stopped (11/27/15 0717)      Time spent: 25 minutes    Eddie North  [facility] Pager [phone] If 7PM-7AM, please contact night-coverage at [email or web address], password [facility] 11/27/2015, 11:51 AM  LOS: 0 days

## 2015-11-27 NOTE — ED Notes (Signed)
Portable X-Ray at bedside.

## 2015-11-27 NOTE — ED Notes (Signed)
Contact [phone] [facility] when pt is moved to a room

## 2015-11-27 NOTE — Progress Notes (Signed)
ANTIBIOTIC CONSULT NOTE - INITIAL  Pharmacy Consult for Cipro Indication: Intra-abdominal infection  Allergies  Allergen Reactions  . Penicillins     hives    Patient Measurements: Height: 5' 6.93" (170 cm) Weight: 129 lb 10.1 oz (58.8 kg) IBW/kg (Calculated) : 65.94   Vital Signs: Temp: 99 F (37.2 C) (01/12 0501) Temp Source: Oral (01/12 0501) BP: 140/62 mmHg (01/12 0531) Pulse Rate: 105 (01/12 0531) Intake/Output from previous day:   Intake/Output from this shift:    Labs:  Recent Labs  11/26/15 2225 11/27/15 0446  WBC 14.7* 13.4*  HGB 13.0 11.3*  PLT 178 163  CREATININE 1.44* 1.28*   Estimated Creatinine Clearance: 30.6 mL/min (by C-G formula based on Cr of 1.28). No results for input(s): VANCOTROUGH, VANCOPEAK, VANCORANDOM, GENTTROUGH, GENTPEAK, GENTRANDOM, TOBRATROUGH, TOBRAPEAK, TOBRARND, AMIKACINPEAK, AMIKACINTROU, AMIKACIN in the last 72 hours.   Microbiology: Recent Results (from the past 720 hour(s))  C difficile quick scan w PCR reflex     Status: None   Collection Time: 11/27/15 12:07 AM  Result Value Ref Range Status   C Diff antigen NEGATIVE NEGATIVE Final   C Diff toxin NEGATIVE NEGATIVE Final   C Diff interpretation Negative for toxigenic C. difficile  Final    Medical History: Past Medical History  Diagnosis Date  . Hypertension   . Hyperlipidemia   . Myocardial infarction (HCC)   . Stroke [facility](HCC) 2013  . CAD (coronary artery disease)     stents placed by Dr Elsie LincolnGamble  . History of stress test 08/2010    Low risk study  . Carotid disease, bilateral (HCC)     revealing moderate right and mild left ICA stenosis.  Marland Kitchen. Hx of echocardiogram 12/2009    Showed good LV function  . CKD (chronic kidney disease), stage II   . Chronic diastolic (congestive) heart failure (HCC)     Medications:   (Not in a hospital admission) Scheduled:  . atorvastatin  20 mg Oral QHS  . beta carotene w/minerals  1 tablet Oral Daily  . cholecalciferol  1,000  Units Oral Daily  . dextromethorphan-guaiFENesin  1 tablet Oral BID  . heparin  5,000 Units Subcutaneous 3 times per day  . levalbuterol  1.25 mg Nebulization QID  . metoprolol tartrate  12.5 mg Oral BID  . sodium chloride  3 mL Intravenous Q12H  . sodium fluoride  1 application dental Daily   Infusions:  . sodium chloride    . ciprofloxacin    . famotidine (PEPCID) IV    . metronidazole 500 mg (11/27/15 0550)   Assessment: 89 yoM c/o emesis and diarrhea.  Cipro per Rx for IAI.   Goal of Therapy:  Treat IAI  Plan:   Cipro 400mg  IV q12h  F/u Scr/cultures  Lorenza EvangelistGreen, Jakia Kennebrew R 11/27/2015,5:54 AM

## 2015-11-27 NOTE — ED Notes (Signed)
Lori from CT explained to me that a head CT would not correctly show his subdural because she has already given him contrast so it make produce a false result. She states we would need to wait 8-12 hrs to get an accurate result.

## 2015-11-27 NOTE — ED Notes (Signed)
Hospitalist notified of lactic acid of 4.1

## 2015-11-27 NOTE — ED Notes (Signed)
Pt did not pass enough stool to collect 

## 2015-11-27 NOTE — ED Notes (Signed)
Hospitalist at bedside 

## 2015-11-27 NOTE — ED Notes (Signed)
Nurse drawing labs. 

## 2015-11-28 ENCOUNTER — Inpatient Hospital Stay (HOSPITAL_COMMUNITY): Payer: Medicare Other

## 2015-11-28 DIAGNOSIS — J189 Pneumonia, unspecified organism: Secondary | ICD-10-CM

## 2015-11-28 LAB — MRSA PCR SCREENING: MRSA BY PCR: NEGATIVE

## 2015-11-28 LAB — BASIC METABOLIC PANEL
Anion gap: 5 (ref 5–15)
BUN: 23 mg/dL — ABNORMAL HIGH (ref 6–20)
CALCIUM: 8.1 mg/dL — AB (ref 8.9–10.3)
CO2: 20 mmol/L — ABNORMAL LOW (ref 22–32)
Chloride: 111 mmol/L (ref 101–111)
Creatinine, Ser: 1.11 mg/dL (ref 0.61–1.24)
GFR, EST NON AFRICAN AMERICAN: 56 mL/min — AB (ref >60)
Glucose, Bld: 96 mg/dL (ref 65–99)
POTASSIUM: 4 mmol/L (ref 3.5–5.1)
SODIUM: 136 mmol/L (ref 135–145)

## 2015-11-28 LAB — CBC
HCT: 33.8 % — ABNORMAL LOW (ref 39.0–52.0)
Hemoglobin: 11.3 g/dL — ABNORMAL LOW (ref 13.0–17.0)
MCH: 31.3 pg (ref 26.0–34.0)
MCHC: 33.4 g/dL (ref 30.0–36.0)
MCV: 93.6 fL (ref 78.0–100.0)
PLATELETS: 176 10*3/uL (ref 150–400)
RBC: 3.61 MIL/uL — AB (ref 4.22–5.81)
RDW: 13.2 % (ref 11.5–15.5)
WBC: 16.6 10*3/uL — AB (ref 4.0–10.5)

## 2015-11-28 LAB — LACTIC ACID, PLASMA
LACTIC ACID, VENOUS: 1.8 mmol/L (ref 0.5–2.0)
Lactic Acid, Venous: 2 mmol/L (ref 0.5–2.0)

## 2015-11-28 LAB — GLUCOSE, CAPILLARY: Glucose-Capillary: 108 mg/dL — ABNORMAL HIGH (ref 65–99)

## 2015-11-28 MED ORDER — DEXTROSE 5 % IV SOLN
1.0000 g | Freq: Two times a day (BID) | INTRAVENOUS | Status: DC
  Administered 2015-11-28 – 2015-12-02 (×10): 1 g via INTRAVENOUS
  Filled 2015-11-28 (×12): qty 1

## 2015-11-28 MED ORDER — FUROSEMIDE 10 MG/ML IJ SOLN
40.0000 mg | Freq: Once | INTRAMUSCULAR | Status: AC
  Administered 2015-11-28: 40 mg via INTRAVENOUS
  Filled 2015-11-28: qty 4

## 2015-11-28 MED ORDER — LEVALBUTEROL HCL 1.25 MG/0.5ML IN NEBU
1.2500 mg | INHALATION_SOLUTION | RESPIRATORY_TRACT | Status: DC
  Administered 2015-11-28 – 2015-11-30 (×12): 1.25 mg via RESPIRATORY_TRACT
  Filled 2015-11-28 (×20): qty 0.5

## 2015-11-28 MED ORDER — TRAMADOL HCL 50 MG PO TABS
50.0000 mg | ORAL_TABLET | Freq: Three times a day (TID) | ORAL | Status: DC | PRN
  Administered 2015-11-28 – 2015-11-29 (×3): 50 mg via ORAL
  Filled 2015-11-28 (×3): qty 1

## 2015-11-28 NOTE — Progress Notes (Signed)
OT Cancellation Note  Patient Details Name: John Montgomery MRN: [id number] DOB: [birth year]   Cancelled Treatment:    Reason Eval/Treat Not Completed: Other (comment) .  Noted pt with increased confusion and on venti-mask.  Will check back over the weekend or on Monday, as schedule permits.  John Montgomery 11/28/2015, 2:26 PM  John Montgomery, OTR/L [phone] 11/28/2015

## 2015-11-28 NOTE — Progress Notes (Signed)
[facility] PROGRESS NOTE  Azzie RoupJulius B Palazzola MVH:[id number] DOB: Mar 27, 1921 DOA: 11/26/2015 PCP: Junious SilkALTHEIMER,MICHAEL D, MD  Brief narrative 82 year old male with history of hypertension, hyperlipidemia, CAD status post stent, bilateral carotid artery stenosis, see daily stage II, history of subdural hematoma, paroxysmal A. fib, diastolic CHF, GERD, anxiety presented to skilled nursing facility presented with acute onset of nausea, vomiting, diarrhea shortness of breath. Patient was also found to be encephalopathic on presentation. Reportedly patient was having nausea with vomiting and diarrhea for the past 3 days and had some shortness of breath. His oxygen saturation decreased to 82% in the ED. Was found to have elevated lactate of 2.05, BNP of 186, leukocytosis with WBC of 14.7 and low-grade fever of 90 and 80 Fahrenheit with acute on chronic kidney injury. Chest x-ray, UA and stool for C. difficile were negative. CT of his abdomen pelvis showed mild gaseous gastric distention with mesenteric edema with multiple small mesenteric lymph nodes. Ultrasound showed moderate bilateral inguinal hernias containing small bowel and colon without inflammation or obstruction. Admitted for further management.  Overnight on 1/12 patient became increasingly confused and agitated. Was tachypnea and hypoxic requiring 8 L Venturi mask. Chest x-ray was done which showed progressive bilateral pulmonary infiltrates prominent throughout the right lung consistent with multifocal pneumonia. Also suggested possible pulmonary edema.   Assessment/Plan: Sepsis with acute encephalopathy Possibly associated with acute viral gastritis/gastroenteritis. Overnight became hypoxic with worsening leukocytosis. Chest x-ray showed multifocal pneumonia on right lung. Stool for C. difficile negative. Started on empiric IV Cipro and Flagyl. Given multifocal right-sided pneumonia, added cefepime. Discontinue Cipro.  Discontinue IV fluids.  Lactic acid normalized. -Continue neuro checks.  Acute hypoxic respiratory failure Likely secondary to underlying pneumonia. May  have volume overload as well. Given a dose of IV Lasix. Antibiotics were broadened to IV cefepime. Follow cultures. Flu PCR negative. Continue on face mask and nasal cannula as tolerated. Continue nebs.  Essential hypertension Continue metoprolol. Amlodipine held due to soft blood pressure is sepsis.  Coronary artery disease History of stent. Continue metoprolol.  Diastolic dysfunction Patient hypovolemic on presentation. Receiving maintenance fluid. Continue beta blocker.  Acute on chronic kidney disease stage II Secondary to sepsis and dehydration. Resolved with fluids.  GERD On Pepcid  Diet: Full liquid DVT prophylaxis  Code Status: Full code Family Communication:Daughter and sister-in-law at bedside  Disposition Plan: Tinea telemetry monitoring   Consultants:  None  Procedures:  Head CT  CT abdomen and pelvis  Antibiotics:  Cipro and Flagyl 1/12--  IV cefepime 1/13--  HPI/Subjective: Seen and examined. Was confused and hypoxic overnight requiring Ventimask.  Objective: Filed Vitals:   11/28/15 1100 11/28/15 1122  BP: 136/45   Pulse: 83 95  Temp: 98.2 F (36.8 C)   Resp: 26 22    Intake/Output Summary (Last 24 hours) at 11/28/15 1533 Last data filed at 11/28/15 [id number]  Gross per 24 hour  Intake 3351.67 ml  Output    200 ml  Net 3151.67 ml   Filed Weights   11/27/15 0547 11/27/15 1334 11/28/15 0500  Weight: 58.8 kg (129 lb 10.1 oz) 61.7 kg (136 lb 0.4 oz) 63.4 kg (139 lb 12.4 oz)    Exam:   General:  Elderly male on Venturi mask, confused,  HEENT: No pallor, supple neck  Chest: Diffuse rhonchi bilaterally,   Cardiovascular: Normal S1 and S2, no murmurs or gallop  GI: Soft, nondistended, nontender, bowel sounds present  Musculoskeletal: Warm, no edema   CNS: confused and irritable  Data  Reviewed: Basic  Metabolic Panel:  Recent Labs Lab 11/26/15 2225 11/27/15 0446 11/28/15 0445  NA 139 139 136  K 3.9 3.7 4.0  CL 107 110 111  CO2 23 19* 20*  GLUCOSE 118* 122* 96  BUN 25* 25* 23*  CREATININE 1.44* 1.28* 1.11  CALCIUM 8.9 8.3* 8.1*   Liver Function Tests:  Recent Labs Lab 11/26/15 2225 11/27/15 0446  AST 28 42*  ALT 18 19  ALKPHOS 65 49  BILITOT 1.0 0.9  PROT 6.8 5.7*  ALBUMIN 4.2 3.3*    Recent Labs Lab 11/26/15 2225  LIPASE 29   No results for input(s): AMMONIA in the last 168 hours. CBC:  Recent Labs Lab 11/26/15 2225 11/27/15 0446 11/28/15 0445  WBC 14.7* 13.4* 16.6*  NEUTROABS 13.1*  --   --   HGB 13.0 11.3* 11.3*  HCT 38.8* 33.7* 33.8*  MCV 93.0 92.8 93.6  PLT 178 163 176   Cardiac Enzymes: No results for input(s): CKTOTAL, CKMB, CKMBINDEX, TROPONINI in the last 168 hours. BNP (last 3 results)  Recent Labs  11/27/15 0312  BNP 186.0*    ProBNP (last 3 results) No results for input(s): PROBNP in the last 8760 hours.  CBG:  Recent Labs Lab 11/27/15 0752 11/28/15 0747  GLUCAP 120* 108*    Recent Results (from the past 240 hour(s))  Culture, blood (x 2)     Status: None (Preliminary result)   Collection Time: 11/26/15 10:25 PM  Result Value Ref Range Status   Specimen Description BLOOD LEFT ANTECUBITAL  Final   Special Requests BOTTLES DRAWN AEROBIC AND ANAEROBIC  Final   Culture   Final    NO GROWTH 1 DAY Performed at [facility]    Report Status PENDING  Incomplete  C difficile quick scan w PCR reflex     Status: None   Collection Time: 11/27/15 12:07 AM  Result Value Ref Range Status   C Diff antigen NEGATIVE NEGATIVE Final   C Diff toxin NEGATIVE NEGATIVE Final   C Diff interpretation Negative for toxigenic C. difficile  Final  Culture, blood (x 2)     Status: None (Preliminary result)   Collection Time: 11/27/15  4:30 AM  Result Value Ref Range Status   Specimen Description BLOOD LEFT WRIST  Final   Special  Requests BOTTLES DRAWN AEROBIC AND ANAEROBIC  Final   Culture   Final    NO GROWTH 1 DAY Performed at [facility]    Report Status PENDING  Incomplete  MRSA PCR Screening     Status: None   Collection Time: 11/27/15 10:41 PM  Result Value Ref Range Status   MRSA by PCR NEGATIVE NEGATIVE Final    Comment:        The GeneXpert MRSA Assay (FDA approved for NASAL specimens only), is one component of a comprehensive MRSA colonization surveillance program. It is not intended to diagnose MRSA infection nor to guide or monitor treatment for MRSA infections.      Studies: Ct Head Wo Contrast  11/27/2015  CLINICAL DATA:  Altered mental status EXAM: CT HEAD WITHOUT CONTRAST TECHNIQUE: Contiguous axial images were obtained from the base of the skull through the vertex without intravenous contrast. COMPARISON:  02/28/2013 FINDINGS: Global atrophy. Chronic ischemic changes in the periventricular white matter. No mass effect, midline shift, or acute hemorrhage. Tiny lacunar infarct in the right caudate head is stable. The cranium is intact. IMPRESSION: No acute intracranial pathology. Electronically Signed  By: Jolaine Click M.D.   On: 11/27/2015 07:34   Ct Abdomen Pelvis W Contrast  11/27/2015  CLINICAL DATA:  82 year old male with vomiting and diarrhea. EXAM: CT ABDOMEN AND PELVIS WITH CONTRAST TECHNIQUE: Multidetector CT imaging of the abdomen and pelvis was performed using the standard protocol following bolus administration of intravenous contrast. CONTRAST:  80mL OMNIPAQUE IOHEXOL 300 MG/ML  SOLN COMPARISON:  None. FINDINGS: Lower chest: Right greater than left basilar atelectasis. Coronary artery calcifications are seen. The heart is normal in size. Liver: 1.5 cm cyst in the left lobe adjacent to the left portal vein. No additional focal lesion. Hepatobiliary: Gallbladder physiologically distended, no calcified stone. No biliary dilatation. Pancreas: No ductal dilatation or  inflammation. Spleen: Normal. Adrenal glands: No nodule. Kidneys: Symmetric renal enhancement and excretion. No hydronephrosis. There is cortical scarring and atrophy of the upper right kidney. Multiple right renal cysts, largest measuring 4.1 cm. Tiny cortical cyst in the left kidney. Stomach/Bowel: There is gaseous gastric distention. Enteric contrast in the distal esophagus with small hiatal hernia. No bowel obstruction, enteric contrast is reached the colon. There are no dilated or thickened small bowel loops. Bilateral inguinal hernias, containing normal appearing small bowel on the right, and normal appearing sigmoid colon on the left. No bowel inflammation or obstruction secondary to these hernias. Small volume of stool throughout the colon without colonic wall thickening. Portions of the appendix are visualized and are normal. Vascular/Lymphatic: There is mesenteric edema in the central mesentery with associated small mesenteric lymph nodes. Mesenteric vessels remain patent. No retroperitoneal adenopathy. Abdominal aorta is normal in caliber. Moderate to advanced atherosclerosis of the abdominal aorta and its branches. No aneurysm. Reproductive: Prostate gland is prominent size. Bladder: Bladder physiologically distended, no wall thickening. Other: No free air, free fluid, or intra-abdominal fluid collection. Musculoskeletal: Scoliotic curvature and advanced multilevel degenerative change in the spine. Prominent Schmorl's node involving superior endplate of L1. The bones appear under mineralized. There are no acute or suspicious osseous abnormalities. IMPRESSION: 1. Mild gaseous gastric distention with contrast in the distal esophagus. There is no small bowel dilatation, and findings may reflect gastroenteritis. 2. Mesenteric edema with multiple small mesenteric lymph nodes an appearance suggestive of "misty mesentery ". This is nonspecific finding and can be seen in the setting of edema secondary to renal  failure, heart failure, or hypoalbuminemia; inflammation such as sclerosing mesenteritis or less likely neoplastic. 3. Moderate bilateral inguinal hernias, containing small bowel on the right and sigmoid colon on the left, no bowel inflammation or obstruction. 4. Incidental findings of atherosclerosis, focal scarring and atrophy of the upper right kidney with multiple renal cysts, and hepatic cyst. Electronically Signed   By: Rubye Oaks M.D.   On: 11/27/2015 02:32   Dg Chest Port 1 View  11/28/2015  CLINICAL DATA:  Shortness of breath. EXAM: PORTABLE CHEST 1 VIEW COMPARISON:  11/27/2015. FINDINGS: Mediastinum hilar structures stable. Stable cardiomegaly. Progressive bilateral pulmonary infiltrates particularly prominent throughout the right lung. Bilateral pneumonia could present this fashion. Pulmonary edema cannot be excluded. No pleural effusion or pneumothorax . IMPRESSION: Progressive bilateral pulmonary infiltrates particularly prominent throughout the right lung consistent with multifocal pneumonia. Congestive heart failure pulmonary edema cannot be excluded. Electronically Signed   By: Maisie Fus  Register   On: 11/28/2015 07:40   Dg Chest Portable 1 View  11/27/2015  CLINICAL DATA:  82 year old male with diarrhea and shortness of breath EXAM: PORTABLE CHEST 1 VIEW COMPARISON:  Radiograph dated 09/21/2014 FINDINGS: Single-view of the chest does  not demonstrate a focal consolidation. There is mild diffuse interstitial prominence possibly a degree of congestion or edema. No pleural effusion or pneumothorax. Stable cardiac silhouette. The osseous structures are grossly unremarkable. IMPRESSION: No focal consolidation. Electronically Signed   By: Elgie Collard M.D.   On: 11/27/2015 03:16    Scheduled Meds: . atorvastatin  20 mg Oral QHS  . beta carotene w/minerals  1 tablet Oral Daily  . ceFEPime (MAXIPIME) IV  1 g Intravenous Q12H  . cholecalciferol  1,000 Units Oral Daily  .  dextromethorphan-guaiFENesin  1 tablet Oral BID  . famotidine (PEPCID) IV  20 mg Intravenous Q12H  . heparin  5,000 Units Subcutaneous 3 times per day  . levalbuterol  1.25 mg Nebulization Q4H  . metoprolol tartrate  12.5 mg Oral BID  . metronidazole  500 mg Intravenous Q8H  . sodium chloride  3 mL Intravenous Q12H  . sodium fluoride  1 application dental Daily   Continuous Infusions:      Time spent: 25 minutes    Parthiv Mucci  [facility] Pager [phone] If 7PM-7AM, please contact night-coverage at [email or web address], password [facility] 11/28/2015, 3:33 PM  LOS: 1 day

## 2015-11-28 NOTE — Progress Notes (Signed)
PT Cancellation Note  Patient Details Name: John Montgomery MRN: [id number] DOB: Jun 10, 1921   Cancelled Treatment:    Reason Eval/Treat Not Completed: Medical issues which prohibited therapy (not medically ready for PT today per RN (on ventimask))   [name],KATHrine E 11/28/2015, 10:06 AM Zenovia JarredKati Kijana Estock, PT, DPT 11/28/2015 Pager: [phone]

## 2015-11-28 NOTE — Progress Notes (Signed)
Patient seeming to be getting more confused and agitated.  BP-136/34, R-26 P-83 91% on 8L venturi mask.  MD made aware of change in status.  Will continue to monitor closely.

## 2015-11-28 NOTE — Progress Notes (Signed)
ANTIBIOTIC CONSULT NOTE - INITIAL  Pharmacy Consult for Cefepime Indication: pneumonia  Allergies  Allergen Reactions  . Penicillins     hives    Patient Measurements: Height: 5\' 7"  (170.2 cm) Weight: 139 lb 12.4 oz (63.4 kg) IBW/kg (Calculated) : 66.1  Vital Signs: Temp: 98 F (36.7 C) (01/13 0530) Temp Source: Oral (01/13 0641) BP: 129/50 mmHg (01/13 0641) Pulse Rate: 87 (01/13 0641) Intake/Output from previous day: 01/12 0701 - 01/13 0700 In: 3351.7 [P.O.:240; I.V.:2311.7; IV Piggyback:800] Out: 301 [Urine:300; Stool:1]  Labs:  Recent Labs  11/26/15 2225 11/27/15 0111 11/27/15 0446 11/28/15 0445  WBC 14.7*  --  13.4* 16.6*  HGB 13.0  --  11.3* 11.3*  PLT 178  --  163 176  LABCREA  --  131.12  --   --   CREATININE 1.44*  --  1.28* 1.11   Estimated Creatinine Clearance: 38.1 mL/min (by C-G formula based on Cr of 1.11).  Microbiology: Recent Results (from the past 720 hour(s))  C difficile quick scan w PCR reflex     Status: None   Collection Time: 11/27/15 12:07 AM  Result Value Ref Range Status   C Diff antigen NEGATIVE NEGATIVE Final   C Diff toxin NEGATIVE NEGATIVE Final   C Diff interpretation Negative for toxigenic C. difficile  Final  MRSA PCR Screening     Status: None   Collection Time: 11/27/15 10:41 PM  Result Value Ref Range Status   MRSA by PCR NEGATIVE NEGATIVE Final    Comment:        The GeneXpert MRSA Assay (FDA approved for NASAL specimens only), is one component of a comprehensive MRSA colonization surveillance program. It is not intended to diagnose MRSA infection nor to guide or monitor treatment for MRSA infections.     Medical History: Past Medical History  Diagnosis Date  . Hypertension   . Hyperlipidemia   . Myocardial infarction (HCC)   . Stroke [facility](HCC) 2013  . CAD (coronary artery disease)     stents placed by Dr Elsie LincolnGamble  . History of stress test 08/2010    Low risk study  . Carotid disease, bilateral (HCC)    revealing moderate right and mild left ICA stenosis.  Marland Kitchen. Hx of echocardiogram 12/2009    Showed good LV function  . CKD (chronic kidney disease), stage II   . Chronic diastolic (congestive) heart failure (HCC)     Assessment: [age] yoM presented on 1/12 with emesis and diarrhea.  He was initially started on cipro and flagyl for suspected intra-abdominal infection.  Today, he desaturated and CXR shows progressive infiltrates c/w multifocal pneumonia.  Pharmacy is consulted to dose Cefepime.  Noted PCN allergy (hives).  Discussed with Dr. Gonzella Lexhungel who agrees with continuing Cefepime order.  Will monitor for reactions.  Today, 11/28/2015: Tmax: 99 WBC elevated and increased to 16.6 SCr 1.1 (improved), with CrCl ~ 38 ml/min Lactic acid: 3.79 > 4.1 > 3.1 > 2.7 > 2.1 (1/12)  Antimicrobials this admission: 1/12 >> metronidazole >> 1/12 >> cipro >> 1/13 1/13 >> cefepime >>  Levels/dose changes this admission: None  Microbiology Results: 1/12 BCx: sent 1/12 GI Panel: sent  1/12 MRSA PCR: negative 1/12 Cdiff: antigen/toxin negative   Goal of Therapy:  Appropriate abx dosing, eradication of infection.  Plan:   Cefepime 1g IV q12h  Follow up renal fxn, culture results, and clinical course.  Lynann Beaverhristine Darbie Biancardi PharmD, BCPS Pager [phone] 11/28/2015 8:09 AM

## 2015-11-28 NOTE — Progress Notes (Signed)
Patient noted to be in respiratory distress upon awaking up this morning. He desat down to 84% on 2L/ [place], resp 29, wheezing on auscultation. Urine output was low. RT came in and gave patient a breathing treatment with little improvement. Tama GanderKatherine Schorr NP was made aware. Patient is now on venturi mask 8L/min. Portable CXR done, result pending. Will continue to monitor patient.

## 2015-11-29 ENCOUNTER — Inpatient Hospital Stay (HOSPITAL_COMMUNITY): Payer: Medicare Other

## 2015-11-29 DIAGNOSIS — J181 Lobar pneumonia, unspecified organism: Secondary | ICD-10-CM

## 2015-11-29 LAB — CBC
HEMATOCRIT: 34 % — AB (ref 39.0–52.0)
Hemoglobin: 11.4 g/dL — ABNORMAL LOW (ref 13.0–17.0)
MCH: 30.8 pg (ref 26.0–34.0)
MCHC: 33.5 g/dL (ref 30.0–36.0)
MCV: 91.9 fL (ref 78.0–100.0)
Platelets: 164 10*3/uL (ref 150–400)
RBC: 3.7 MIL/uL — ABNORMAL LOW (ref 4.22–5.81)
RDW: 12.9 % (ref 11.5–15.5)
WBC: 13.1 10*3/uL — ABNORMAL HIGH (ref 4.0–10.5)

## 2015-11-29 LAB — GLUCOSE, CAPILLARY
Glucose-Capillary: 66 mg/dL (ref 65–99)
Glucose-Capillary: 92 mg/dL (ref 65–99)

## 2015-11-29 MED ORDER — FUROSEMIDE 10 MG/ML IJ SOLN
40.0000 mg | Freq: Once | INTRAMUSCULAR | Status: AC
  Administered 2015-11-29: 40 mg via INTRAVENOUS
  Filled 2015-11-29: qty 4

## 2015-11-29 MED ORDER — LORAZEPAM 2 MG/ML IJ SOLN
1.0000 mg | Freq: Once | INTRAMUSCULAR | Status: AC
  Administered 2015-11-29: 1 mg via INTRAVENOUS
  Filled 2015-11-29: qty 1

## 2015-11-29 NOTE — Progress Notes (Signed)
OT Cancellation Note  Patient Details Name: John RoupJulius B Assad MRN: [id number] DOB: 1921-08-25   Cancelled Treatment:    Reason Eval/Treat Not Completed: Medical issues which prohibited therapy  Noted pt venturi mask 8L After sats decreased  With bath. Will recheck on pt as he improves and will be able to tolerate OT eval. Noted chart says she is from SNF- but another location says ALF- need to clarify  Lise AuerLori Janese Radabaugh, OT [phone]  Einar CrowEDDING, Jaaziah Schulke D 11/29/2015, 12:03 PM

## 2015-11-29 NOTE — Progress Notes (Signed)
PT Cancellation Note  Patient Details Name: John Montgomery MRN: [id number] DOB: November 29, 1920   Cancelled Treatment:    Reason Eval/Treat Not Completed: Patient at procedure or test/unavailable Pt sleeping this morning and out of room this afternoon.  Will check back as schedule permits.   Seraphim Affinito,KATHrine E 11/29/2015, 2:52 PM Zenovia JarredKati Michelene Keniston, PT, DPT 11/29/2015 Pager: [phone]

## 2015-11-29 NOTE — Progress Notes (Signed)
While getting a bath patient o2 sats dropped to 82% on 4L [place], with minimal exertion.  Placed back on venturi mask 8L. WIll continue to monitor closely.

## 2015-11-29 NOTE — NC FL2 (Addendum)
[place] MEDICAID FL2 LEVEL OF CARE SCREENING TOOL     IDENTIFICATION  Patient Name: John Montgomery Birthdate: December 13, 1920 Sex: male Admission Date (Current Location): 11/26/2015  [facility] and [place] Number:  Producer, television/film/video and Address:  [facility]      Provider Number: [id number]  Attending Physician Name and Address:  Eddie North, MD  Relative Name and Phone Number:       Current Level of Care: Hospital Recommended Level of Care: SNF    Prior Approval Number:    Date Approved/Denied:   PASRR Number:  [id number]  Discharge Plan: Domiciliary (Rest home) (ALF)    Current Diagnoses: Patient Active Problem List   Diagnosis Date Noted  . Nausea vomiting and diarrhea 11/27/2015  . Acute respiratory failure with hypoxia (HCC) 11/27/2015  . Cough 11/27/2015  . Acute-on-chronic kidney injury (HCC) 11/27/2015  . Sepsis (HCC) 11/27/2015  . Acute encephalopathy 11/27/2015  . Right flaccid hemiparesis (HCC) 10/31/2013  . Fx mult cervical vert-closed (HCC) 04/10/2013  . PAF (paroxysmal atrial fibrillation) (HCC) 10/19/2012  . First degree AV block 10/19/2012  . SDH (subdural hematoma) (HCC) 10/19/2012  . HTN (hypertension) 10/19/2012  . CAD, RCA PTCA '98, RCA DES '06. Low risk Nuc 2011 10/19/2012  . Dyslipidemia 10/19/2012  . PVD, moderate RICA disease 10/19/2012  . Diastolic dysfunction, grade 1 with an EF of 50-55% 2011 10/19/2012    Orientation RESPIRATION BLADDER Height & Weight    Self  O2 (4L) Incontinent   134 lbs.  BEHAVIORAL SYMPTOMS/MOOD NEUROLOGICAL BOWEL NUTRITION STATUS      Incontinent Diet  AMBULATORY STATUS COMMUNICATION OF NEEDS Skin   Extensive Assist Verbally Normal                       Personal Care Assistance Level of Assistance  Bathing, Feeding, Dressing Bathing Assistance: Limited assistance Feeding assistance: Limited assistance Dressing Assistance: Limited assistance     Functional Limitations Info           SPECIAL CARE FACTORS FREQUENCY  PT (By licensed PT), OT (By licensed OT), Speech therapy                    Contractures      Additional Factors Info  Allergies Code Status : DNR Allergies Info: Penicillins           Current Medications (11/29/2015):  This is the current hospital active medication list Current Facility-Administered Medications  Medication Dose Route Frequency Provider Last Rate Last Dose  . acetaminophen (TYLENOL) tablet 650 mg  650 mg Oral Q6H PRN Lorretta Harp, MD   650 mg at 11/27/15 0423   Or  . acetaminophen (TYLENOL) suppository 650 mg  650 mg Rectal Q6H PRN Lorretta Harp, MD      . atorvastatin (LIPITOR) tablet 20 mg  20 mg Oral QHS Lorretta Harp, MD   20 mg at 11/28/15 2117  . baclofen (LIORESAL) tablet 5 mg  5 mg Oral BID PRN Lorretta Harp, MD   5 mg at 11/28/15 1227  . beta carotene w/minerals (OCUVITE) tablet 1 tablet  1 tablet Oral Daily Lorretta Harp, MD   1 tablet at 11/29/15 1045  . ceFEPIme (MAXIPIME) 1 g in dextrose 5 % 50 mL IVPB  1 g Intravenous Q12H Christine E Shade, RPH   1 g at 11/29/15 1159  . cholecalciferol (VITAMIN D) tablet 1,000 Units  1,000 Units Oral Daily Lorretta Harp, MD   1,000 Units  at 11/29/15 1045  . dextromethorphan-guaiFENesin (MUCINEX DM) 30-600 MG per 12 hr tablet 1 tablet  1 tablet Oral BID Lorretta HarpXilin Niu, MD   1 tablet at 11/29/15 1045  . diazepam (VALIUM) tablet 2 mg  2 mg Oral Q8H PRN Lorretta HarpXilin Niu, MD   2 mg at 11/28/15 1341  . famotidine (PEPCID) IVPB 20 mg premix  20 mg Intravenous Q12H Lorretta HarpXilin Niu, MD 100 mL/hr at 11/29/15 1046 20 mg at 11/29/15 1046  . fluticasone (CUTIVATE) 0.05 % cream 1 application  1 application Topical BID PRN Lorretta HarpXilin Niu, MD      . heparin injection 5,000 Units  5,000 Units Subcutaneous 3 times per day Lorretta HarpXilin Niu, MD   5,000 Units at 11/29/15 0528  . levalbuterol (XOPENEX) nebulizer solution 1.25 mg  1.25 mg Nebulization Q4H Nishant Dhungel, MD   1.25 mg at 11/29/15 0413  . metoprolol tartrate (LOPRESSOR) tablet  12.5 mg  12.5 mg Oral BID Lorretta HarpXilin Niu, MD   12.5 mg at 11/29/15 1045  . metroNIDAZOLE (FLAGYL) IVPB 500 mg  500 mg Intravenous Q8H Lorretta HarpXilin Niu, MD 100 mL/hr at 11/29/15 0528 500 mg at 11/29/15 0528  . ondansetron (ZOFRAN) injection 4 mg  4 mg Intravenous Q8H PRN Lorretta HarpXilin Niu, MD      . sodium chloride 0.9 % injection 3 mL  3 mL Intravenous Q12H Lorretta HarpXilin Niu, MD   3 mL at 11/28/15 2118  . sodium fluoride (PREVIDENT 5000 PLUS) 1.1 % dental cream 1 application  1 application dental Daily Lorretta HarpXilin Niu, MD   1 application at 11/27/15 1000  . traMADol (ULTRAM) tablet 50 mg  50 mg Oral Q8H PRN Nishant Dhungel, MD   50 mg at 11/29/15 0735  . TRIPLE ANTIBIOTIC [id number] OINT 1 application  1 application Apply externally BID PRN Lorretta HarpXilin Niu, MD         Discharge Medications: Please see discharge summary for a list of discharge medications.  Relevant Imaging Results:  Relevant Lab Results:   Additional Information SSN [id number]  Liliana ClineCaldwell, Janet Parks, LCSW

## 2015-11-29 NOTE — Progress Notes (Signed)
Assessment / Plan / Recommendation  CHL IP CLINICAL IMPRESSIONS 11/29/2015  Therapy Diagnosis Moderate oral phase dysphagia;Moderate pharyngeal phase dysphagia;Mild cervical esophageal phase dysphagia  Clinical Impression  Pt presents with moderate oropharyngeal and suspected mild cervical esophageal dysphagia with sensorimotor deficits.  Weakness results in decreased oral cohesion and decreased posterior propulsion.  Premature spillage of nectar into pharynx to pyriform sinus noted x 1 via straw.  Pharyngeal swallow was characterized by good laryngeal elevation but poor pharyngeal contraction and tongue base retraction.  Said weakness results in residuals that pt does not consistently sense but is decreased with dry swallows *reflexive and cued.  Residuals were worse with increased viscocity.  Trace laryngeal penetration of thin noted to upper larynx only.  Pt's largest aspiration risk appears to be due to residuals that pt has difficulty clearing.   Using live video and verbal feedback, educated pt/daughter and aunt Darel HongJudy to findings/recommendations to MITIGATE risk.  Advised daughter Sedalia MutaDiane to suspicion of aspiration due to weakness and residuals.  Recommend continue full liquids = avoiding pudding/grits due to increased viscocity  Strict aspiration precautions recommeded. SLP to follow up. .    Impact on safety and function Moderate aspiration risk      CHL IP TREATMENT RECOMMENDATION 11/29/2015  Treatment Recommendations Therapy as outlined in treatment plan below     Prognosis 11/29/2015  Prognosis for Safe Diet Advancement Fair  Barriers to Reach Goals Other (Comment)  Barriers/Prognosis Comment --    CHL IP DIET RECOMMENDATION 11/29/2015  SLP Diet Recommendations Other (Comment)  Liquid Administration via Straw;Spoon  Medication Administration Crushed with puree  Compensations Slow rate;Small sips/bites;Multiple dry swallows after each bite/sip;Other (Comment)  Postural Changes --       CHL IP OTHER RECOMMENDATIONS 11/29/2015  Recommended Consults --  Oral Care Recommendations Oral care BID  Other Recommendations --      Donavan Burnetamara Liann Spaeth, MS [facility] SLP [phone]

## 2015-11-29 NOTE — Progress Notes (Signed)
CRITICAL VALUE ALERT  Critical value received:  glucose  Date of notification:  11/28/2014  Time of notification:  0743   Critical value read back:Yes.    Nurse who received alert:  Debroah LoopKatie Dirck Butch   MD notified (1st page):  Dhungel  Time of first page:    MD notified (2nd page):  Time of second page:  Responding MD:    Time MD responded:    Gave apple juice- came up to 92.

## 2015-11-29 NOTE — Progress Notes (Signed)
[facility] PROGRESS NOTE  John Montgomery ZOX:[id number] DOB: 07/07/1921 DOA: 11/26/2015 PCP: Junious Silk, MD  Brief narrative 82 year old male with history of hypertension, hyperlipidemia, CAD status post stent, bilateral carotid artery stenosis, see daily stage II, history of subdural hematoma, paroxysmal A. fib, diastolic CHF, GERD, anxiety presented to skilled nursing facility presented with acute onset of nausea, vomiting, diarrhea shortness of breath. Patient was also found to be encephalopathic on presentation. Reportedly patient was having nausea with vomiting and diarrhea for the past 3 days and had some shortness of breath. His oxygen saturation decreased to 82% in the ED. Was found to have elevated lactate of 2.05, BNP of 186, leukocytosis with WBC of 14.7 and low-grade fever of 90 and 80 Fahrenheit with acute on chronic kidney injury. Chest x-ray, UA and stool for C. difficile were negative. CT of his abdomen pelvis showed mild gaseous gastric distention with mesenteric edema with multiple small mesenteric lymph nodes. Ultrasound showed moderate bilateral inguinal hernias containing small bowel and colon without inflammation or obstruction. Admitted for further management.  Overnight on 1/12 patient became increasingly confused and agitated. Was tachypnea and hypoxic requiring 8 L Venturi mask. Chest x-ray was done which showed progressive bilateral pulmonary infiltrates prominent throughout the right lung consistent with multifocal pneumonia. Also suggested possible pulmonary edema.   Assessment/Plan: Sepsis with acute encephalopathy Possibly associated with acute viral gastritis/gastroenteritis. Overnight on 1/12  became hypoxic with worsening leukocytosis. Chest x-ray showed multifocal pneumonia on right lung.  -Antibiotic broadened to IV cefepime. Continue Flagyl. Stool for C. difficile and blood cultures negative. -Mentation improved this morning.  Acute hypoxic  respiratory failure Likely secondary to underlying pneumonia with some volume overload. Antibiotics were broadened to IV cefepime. Follow blood culture so far negative. Flu PCR negative.  Now on nasal cannula. titrate as tolerated.  Hypoglycemia Blood glucose drop to 60s this morning. Symptomatic. Improved after receiving apple juice. Will order a bedtime snack.  Essential hypertension Continue metoprolol. Amlodipine held due to soft blood pressure   Coronary artery disease History of stent. Continue metoprolol.  Diastolic dysfunction Received maintenance fluids given hypovolemia on presentation. Now getting when necessary Lasix for fluid overload. Continue beta blocker.  Acute on chronic kidney disease stage II Secondary to sepsis and dehydration. Resolved with fluids.  GERD On Pepcid  Diet: Heart healthy  DVT prophylaxis  Code Status: Full code Family Communication:Daughter at bedside  Disposition Plan: Continue inpatient monitoring. PT evaluation.   Consultants:  None  Procedures:  Head CT  CT abdomen and pelvis  Antibiotics:  Cipro and Flagyl 1/12--  IV cefepime 1/13--  HPI/Subjective: Seen and examined. Low blood sugar this morning improved after receiving apple juice. More alert and oriented today. Addition to nasal cannula overnight.  Objective: Filed Vitals:   11/28/15 1536 11/29/15 0500  BP: 139/60 126/48  Pulse: 106 93  Temp: 98.4 F (36.9 C) 98.3 F (36.8 C)  Resp: 24 22    Intake/Output Summary (Last 24 hours) at 11/29/15 1052 Last data filed at 11/29/15 0536  Gross per 24 hour  Intake    800 ml  Output   2200 ml  Net  -1400 ml   Filed Weights   11/27/15 1334 11/28/15 0500 11/29/15 0500  Weight: 61.7 kg (136 lb 0.4 oz) 63.4 kg (139 lb 12.4 oz) 61 kg (134 lb 7.7 oz)    Exam:   General:  Elderly male on nasal cannula. More oriented  HEENT: supple neck  Chest: Diffuse wheezing bilaterally  Cardiovascular: Normal S1 and S2, no  murmurs or gallop  GI: Soft, nondistended, nontender, bowel sounds present  Musculoskeletal: Warm, no edema   CNS: Alert and oriented 2  Data Reviewed: Basic Metabolic Panel:  Recent Labs Lab 11/26/15 2225 11/27/15 0446 11/28/15 0445  NA 139 139 136  K 3.9 3.7 4.0  CL 107 110 111  CO2 23 19* 20*  GLUCOSE 118* 122* 96  BUN 25* 25* 23*  CREATININE 1.44* 1.28* 1.11  CALCIUM 8.9 8.3* 8.1*   Liver Function Tests:  Recent Labs Lab 11/26/15 2225 11/27/15 0446  AST 28 42*  ALT 18 19  ALKPHOS 65 49  BILITOT 1.0 0.9  PROT 6.8 5.7*  ALBUMIN 4.2 3.3*    Recent Labs Lab 11/26/15 2225  LIPASE 29   No results for input(s): AMMONIA in the last 168 hours. CBC:  Recent Labs Lab 11/26/15 2225 11/27/15 0446 11/28/15 0445 11/29/15 0850  WBC 14.7* 13.4* 16.6* 13.1*  NEUTROABS 13.1*  --   --   --   HGB 13.0 11.3* 11.3* 11.4*  HCT 38.8* 33.7* 33.8* 34.0*  MCV 93.0 92.8 93.6 91.9  PLT 178 163 176 164   Cardiac Enzymes: No results for input(s): CKTOTAL, CKMB, CKMBINDEX, TROPONINI in the last 168 hours. BNP (last 3 results)  Recent Labs  11/27/15 0312  BNP 186.0*    ProBNP (last 3 results) No results for input(s): PROBNP in the last 8760 hours.  CBG:  Recent Labs Lab 11/27/15 0752 11/28/15 0747 11/29/15 0743 11/29/15 0822  GLUCAP 120* 108* 66 92    Recent Results (from the past 240 hour(s))  Culture, blood (x 2)     Status: None (Preliminary result)   Collection Time: 11/26/15 10:25 PM  Result Value Ref Range Status   Specimen Description BLOOD LEFT ANTECUBITAL  Final   Special Requests BOTTLES DRAWN AEROBIC AND ANAEROBIC 5ML  Final   Culture   Final    NO GROWTH 1 DAY [facility]    Report Status PENDING  Incomplete  C difficile quick scan w PCR reflex     Status: None   Collection Time: 11/27/15 12:07 AM  Result Value Ref Range Status   C Diff antigen NEGATIVE NEGATIVE Final   C Diff toxin NEGATIVE NEGATIVE Final   C  Diff interpretation Negative for toxigenic C. difficile  Final  Culture, blood (x 2)     Status: None (Preliminary result)   Collection Time: 11/27/15  4:30 AM  Result Value Ref Range Status   Specimen Description BLOOD LEFT WRIST  Final   Special Requests BOTTLES DRAWN AEROBIC AND ANAEROBIC 5ML  Final   Culture   Final    NO GROWTH 1 DAY Performed at [facility]    Report Status PENDING  Incomplete  MRSA PCR Screening     Status: None   Collection Time: 11/27/15 10:41 PM  Result Value Ref Range Status   MRSA by PCR NEGATIVE NEGATIVE Final    Comment:        The GeneXpert MRSA Assay (FDA approved for NASAL specimens only), is one component of a comprehensive MRSA colonization surveillance program. It is not intended to diagnose MRSA infection nor to guide or monitor treatment for MRSA infections.      Studies: Dg Chest Port 1 View  11/28/2015  CLINICAL DATA:  Shortness of breath. EXAM: PORTABLE CHEST 1 VIEW COMPARISON:  11/27/2015. FINDINGS: Mediastinum hilar structures stable. Stable cardiomegaly. Progressive bilateral pulmonary infiltrates particularly  prominent throughout the right lung. Bilateral pneumonia could present this fashion. Pulmonary edema cannot be excluded. No pleural effusion or pneumothorax . IMPRESSION: Progressive bilateral pulmonary infiltrates particularly prominent throughout the right lung consistent with multifocal pneumonia. Congestive heart failure pulmonary edema cannot be excluded. Electronically Signed   By: Maisie Fus  Register   On: 11/28/2015 07:40    Scheduled Meds: . atorvastatin  20 mg Oral QHS  . beta carotene w/minerals  1 tablet Oral Daily  . ceFEPime (MAXIPIME) IV  1 g Intravenous Q12H  . cholecalciferol  1,000 Units Oral Daily  . dextromethorphan-guaiFENesin  1 tablet Oral BID  . famotidine (PEPCID) IV  20 mg Intravenous Q12H  . heparin  5,000 Units Subcutaneous 3 times per day  . levalbuterol  1.25 mg Nebulization Q4H  .  metoprolol tartrate  12.5 mg Oral BID  . metronidazole  500 mg Intravenous Q8H  . sodium chloride  3 mL Intravenous Q12H  . sodium fluoride  1 application dental Daily   Continuous Infusions:      Time spent: 25 minutes    Daegan Arizmendi  [facility] Pager [phone] If 7PM-7AM, please contact night-coverage at [email or web address], password [facility] 11/29/2015, 10:52 AM  LOS: 2 days

## 2015-11-29 NOTE — Evaluation (Signed)
Clinical/Bedside Swallow Evaluation Patient Details  Name: John Montgomery MRN: [id number] Date of Birth: 05/28/1921  Today's Date: 11/29/2015 Time: SLP Start Time (ACUTE ONLY): 1205 SLP Stop Time (ACUTE ONLY): 1300 SLP Time Calculation (min) (ACUTE ONLY): 55 min  Past Medical History:  Past Medical History  Diagnosis Date  . Hypertension   . Hyperlipidemia   . Myocardial infarction (HCC)   . Stroke [facility](HCC) 2013  . CAD (coronary artery disease)     stents placed by Dr Elsie LincolnGamble  . History of stress test 08/2010    Low risk study  . Carotid disease, bilateral (HCC)     revealing moderate right and mild left ICA stenosis.  Marland Kitchen. Hx of echocardiogram 12/2009    Showed good LV function  . CKD (chronic kidney disease), stage II   . Chronic diastolic (congestive) heart failure [facility](HCC)    Past Surgical History:  Past Surgical History  Procedure Laterality Date  . Angioplasty  2006    RCA stenting by Dr Elsie LincolnGamble   HPI:  82 yo male [facility] with nausea/vomiting/diarhhea and dyspnea x 3 days prior to admit, ? viral gastroenteritis.  Pt found to have Right lung pna.  Daughter present reports pt with recent issues with vomiting over the last 3 weeks.  Pt also with h/o SDH with right sided weakness.  HE admits to issues with drooling over the last few months and coughing some with intake on food and drink.  Swallow eval ordered.     Assessment / Plan / Recommendation Clinical Impression  Pt presents with clinical indications concerning for airway protection with po intake.  He has baseline high RR with that worsens with exertion.  Right sided weakness results in decreased right labial seal.  Delayed oral transiting suspected with post=swallow coughing/throat clearing concerning for laryngeal penetration or aspiration.  Coughing much more pronounced with thinner consistencies however occurs across all.  Question impact of pt's frequent nausea/vomiting issues within the last few weeks?  Pt was not  coughing prior to intake during this evaluation.  Of note, pt reports drooling over the last few months, voice is currently diminished with speech at rapid rate with imprecise articulation - ? source.    Reviewed possible options of proceeding with MBS or modifying diet further to mitigate risk *nectar via tsp only.  Daughter reports understnading.    Phoned MD to agreed for this SLP to proceed with MBS to allow instrumental swallow eval as suspect baseline dysphagia for which pt has compensated until this acute illness.    Made pt/daughter Diane aware and invited daughter to observe testing.      Aspiration Risk  Severe aspiration risk    Diet Recommendation NPO;NPO except meds;Ice chips PRN after oral care   Medication Administration: Whole meds with puree Postural Changes: Seated upright at 90 degrees;Remain upright for at least 30 minutes after po intake    Other  Recommendations Oral Care Recommendations: Oral care BID   Follow up Recommendations     TBD  Frequency and Duration            Prognosis        Swallow Study   General Date of Onset: 11/29/15 HPI: 82 yo male [facility] with nausea/vomiting/diarhhea and dyspnea x 3 days prior to admit, ? viral gastroenteritis.  Pt found to have Right lung pna.  Daughter present reports pt with recent issues with vomiting over the last 3 weeks.  Pt also with h/o SDH with  right sided weakness.  HE admits to issues with drooling over the last few months and coughing some with intake on food and drink.  Swallow eval ordered.   Type of Study: Bedside Swallow Evaluation Diet Prior to this Study: Thin liquids (full liquids) Temperature Spikes Noted: No Respiratory Status: Venti-mask;Increased respiratory rate History of Recent Intubation: No Behavior/Cognition: Alert;Cooperative;Pleasant mood Oral Cavity Assessment: Within Functional Limits Oral Care Completed by SLP: No Oral Cavity - Dentition: Adequate natural dentition Vision:  Functional for self-feeding Self-Feeding Abilities: Needs assist (due to highflow oxygen needs) Patient Positioning: Upright in chair Baseline Vocal Quality: Breathy;Hoarse;Low vocal intensity Volitional Cough: Weak Volitional Swallow: Able to elicit    Oral/Motor/Sensory Function Overall Oral Motor/Sensory Function: Moderate impairment (and generalized weakness) Facial ROM: Reduced right Facial Symmetry: Abnormal symmetry right Facial Strength: Reduced right Facial Sensation: Reduced right Velum:  (bilateral )   Ice Chips Ice chips: Not tested   Thin Liquid Thin Liquid: Impaired Presentation: Cup;Spoon;Straw Oral Phase Impairments: Reduced lingual movement/coordination;Poor awareness of bolus;Reduced labial seal Oral Phase Functional Implications: Prolonged oral transit;Other (comment) Pharyngeal  Phase Impairments: Suspected delayed Swallow;Throat Clearing - Delayed;Cough - Immediate    Nectar Thick Nectar Thick Liquid: Impaired Presentation: Spoon;Straw Oral Phase Impairments: Reduced labial seal Oral phase functional implications: Prolonged oral transit Pharyngeal Phase Impairments: Suspected delayed Swallow;Throat Clearing - Delayed   Honey Thick Honey Thick Liquid: Not tested   Puree Puree: Impaired Presentation: Spoon Oral Phase Impairments: Reduced lingual movement/coordination;Reduced labial seal Oral Phase Functional Implications: Prolonged oral transit Pharyngeal Phase Impairments: Throat Clearing - Delayed   Solid   GO   Solid: Not tested        Donavan Burnet, MS [facility] SLP [phone]

## 2015-11-29 NOTE — Progress Notes (Addendum)
Telemetry called and stated that patient switched into a-fib.  Pt asymptomatic.  MD made aware.  Will continue to monitor closely.

## 2015-11-29 NOTE — Clinical Social Work Note (Signed)
Clinical Social Work Assessment  Patient Details  Name: John Montgomery MRN: [id number] Date of Birth: 03/23/1921  Date of referral:  11/29/15               Reason for consult:  Facility Placement                Permission sought to share information with:  Family Supports Permission granted to share information::  No  Name::     daughterShauna Hugh [phone]  Agency::     Relationship::     Contact Information:     Housing/Transportation Living arrangements for the past 2 months:  [place] of Information:  Adult Children Patient Interpreter Needed:  None Criminal Activity/Legal Involvement Pertinent to Current Situation/Hospitalization:  No - Comment as needed Significant Relationships:  Warehouse manager, Adult Children Lives with:  Facility Resident Do you feel safe going back to the place where you live?  Yes Need for family participation in patient care:  Yes (Comment)  Care giving concerns:  Patient admitted from [place]   Social Worker assessment / plan:  CSW met with daughter who reports her father has resided at [facility] for about 2 years. She reports he is primarily wheelchair level mobility at ALF- awaiting PT eval now. CSW discussed with daughter the CSW role and plans to follow along to help assess dc needs including determination of level of care at dc (?SNF).  Employment status:  Retired Forensic scientist:  Medicare PT Recommendations:  Not assessed at this time Information / Referral to community resources:     Patient/Family's Response to care:  Daughter engaged and understands plan of care as well as dc planning-   Patient/Family's Understanding of and Emotional Response to Diagnosis, Current Treatment, and Prognosis:  Good/optimistic  Emotional Assessment Appearance:  Appears younger than stated age Attitude/Demeanor/Rapport:  Unable to Assess Affect (typically observed):  Unable to Assess Orientation:  Oriented  to Self Alcohol / Substance use:  Not Applicable Psych involvement (Current and /or in the community):  No (Comment)  Discharge Needs  Concerns to be addressed:  Discharge Planning Concerns Readmission within the last 30 days:  No Current discharge risk:  None Barriers to Discharge:  No Barriers Identified   Ludwig Clarks, LCSW 11/29/2015, 12:06 PM

## 2015-11-30 ENCOUNTER — Inpatient Hospital Stay (HOSPITAL_COMMUNITY): Payer: Medicare Other

## 2015-11-30 LAB — BLOOD GAS, ARTERIAL
Acid-base deficit: 2 mmol/L (ref 0.0–2.0)
Bicarbonate: 20.9 mEq/L (ref 20.0–24.0)
DRAWN BY: 257701
FIO2: 0.55
O2 Saturation: 95.4 %
PH ART: 7.445 (ref 7.350–7.450)
Patient temperature: 37
TCO2: 18.9 mmol/L (ref 0–100)
pCO2 arterial: 30.9 mmHg — ABNORMAL LOW (ref 35.0–45.0)
pO2, Arterial: 86.8 mmHg (ref 80.0–100.0)

## 2015-11-30 LAB — GLUCOSE, CAPILLARY: Glucose-Capillary: 84 mg/dL (ref 65–99)

## 2015-11-30 MED ORDER — FUROSEMIDE 10 MG/ML IJ SOLN: 40.0000 mg | Freq: Once | INTRAMUSCULAR | Status: DC

## 2015-11-30 MED ORDER — LEVALBUTEROL HCL 0.63 MG/3ML IN NEBU
0.6300 mg | INHALATION_SOLUTION | RESPIRATORY_TRACT | Status: DC | PRN
  Filled 2015-11-30: qty 3

## 2015-11-30 MED ORDER — LEVALBUTEROL HCL 1.25 MG/0.5ML IN NEBU
1.2500 mg | INHALATION_SOLUTION | Freq: Four times a day (QID) | RESPIRATORY_TRACT | Status: DC
  Administered 2015-12-01 – 2015-12-02 (×4): 1.25 mg via RESPIRATORY_TRACT
  Filled 2015-11-30 (×10): qty 0.5

## 2015-11-30 MED ORDER — FUROSEMIDE 10 MG/ML IJ SOLN
40.0000 mg | INTRAMUSCULAR | Status: AC
  Administered 2015-11-30: 40 mg via INTRAVENOUS
  Filled 2015-11-30: qty 4

## 2015-11-30 MED ORDER — LORAZEPAM 2 MG/ML IJ SOLN
1.0000 mg | Freq: Four times a day (QID) | INTRAMUSCULAR | Status: DC | PRN
  Administered 2015-11-30 – 2015-12-02 (×5): 1 mg via INTRAVENOUS
  Filled 2015-11-30 (×5): qty 1

## 2015-11-30 NOTE — Progress Notes (Signed)
Pt combative, swinging fist at staff, pulling on IV lines. Pt reoriented to room, mittens applied to patients bilateral hands

## 2015-11-30 NOTE — Progress Notes (Signed)
Called into room by patient. Patient appears to be in respiratory distress. HR 130 Respiration 28 and oxygen sats 70% on room air. Applied nasal cannula @ 4L oxygen saturations up to 88%, then applied Venti mask at 55%  Oxygen sats up to 92%. Notified Patient's nurse whom contacted  RT. Notified Rapid response nurse to assess patient. Patient's Nurse Dorene Sorrow(Jerry , RN) to  notify MD.

## 2015-11-30 NOTE — Progress Notes (Signed)
Claiborne Billingsallahan NP- Traid notified pt is confused and refused oral care and unable to give any po meds.Drowsy at present VSS. Linward HeadlandBeverly, Esteven Overfelt D

## 2015-11-30 NOTE — Progress Notes (Signed)
Pt switched from venti mask to nasal cannula at 1605 per MD order.  Pt's O2 sats are currently 97% on 4L.  Pt resting.  Will continue to monitor.

## 2015-11-30 NOTE — Progress Notes (Signed)
[facility] PROGRESS NOTE  John Montgomery ZOX:[id number] DOB: 13-Apr-1921 DOA: 11/26/2015 PCP: Junious Silk, MD  Brief narrative 82 year old male with history of hypertension, hyperlipidemia, CAD status post stent, bilateral carotid artery stenosis, see daily stage II, history of subdural hematoma, paroxysmal A. fib, diastolic CHF, GERD, anxiety presented to skilled nursing facility presented with acute onset of nausea, vomiting, diarrhea shortness of breath. Patient was also found to be encephalopathic on presentation. Reportedly patient was having nausea with vomiting and diarrhea for the past 3 days and had some shortness of breath. His oxygen saturation decreased to 82% in the ED. Was found to have elevated lactate of 2.05, BNP of 186, leukocytosis with WBC of 14.7 and low-grade fever of 90 and 80 Fahrenheit with acute on chronic kidney injury. Chest x-ray, UA and stool for C. difficile were negative. CT of his abdomen pelvis showed mild gaseous gastric distention with mesenteric edema with multiple small mesenteric lymph nodes. Ultrasound showed moderate bilateral inguinal hernias containing small bowel and colon without inflammation or obstruction. Admitted for further management.  Overnight on 1/12 patient became increasingly confused and agitated. Was tachypnea and hypoxic requiring 8 L Venturi mask. Chest x-ray was done which showed progressive bilateral pulmonary infiltrates prominent throughout the right lung consistent with multifocal pneumonia. Also suggested possible pulmonary edema.  On 1/15 patient then had respiratory distress with increased work of breathing. Was actively wheezing and placed on Venturi mask. Rapid response called. Patient transferred to stepdown unit.   Assessment/Plan: Sepsis with acute encephalopathy Possibly associated with acute viral gastritis/gastroenteritis followed by right lobar pneumonia. Overnight on 1/12  became hypoxic with worsening  leukocytosis. Chest x-ray showed multifocal pneumonia on right lung.  -Antibiotic broadened to IV cefepime. Continue Flagyl. Stool for C. difficile and blood cultures negative. -Mentation improved .  Acute hypoxic respiratory failure Likely secondary to underlying pneumonia with some volume overload. Patient had respiratory distress again this morning. Transfer to stepdown unit on Ventimask. Has some pulmonary edema on chest x-ray. Receiving IV Lasix when necessary. Antibiotics were broadened to IV cefepime. blood culture negative. Flu PCR negative.      Essential hypertension Continue metoprolol. Amlodipine held due to soft blood pressure   Coronary artery disease History of stent. Continue metoprolol.  Diastolic dysfunction Received maintenance fluids given hypovolemia on presentation. Now getting when necessary Lasix for fluid overload. Continue beta blocker.  Acute on chronic kidney disease stage II Secondary to sepsis and dehydration. Resolved with fluids.  GERD On Pepcid  Diet: Heart healthy  DVT prophylaxis  Code Status: DO NOT RESUSCITATE. Confirmed with his son Family Communication: Son at bedside  Disposition Plan: Transfer to stepdown unit.   Consultants:  None  Procedures:  Head CT  CT abdomen and pelvis  Antibiotics:  Cipro and Flagyl 1/12--  IV cefepime 1/13--  HPI/Subjective: Seen and examined. Patient went into respiratory distress this morning with dropping O2 sat to the 70s with increased work of breathing. Placed on Ventimask at 55% and O2 sat improved to 92%. Rapid response was called. Patient started on nebulizer and transfer to stepdown unit.  Objective: Filed Vitals:   11/30/15 0900 11/30/15 1000  BP: 140/62 133/51  Pulse: 129 110  Temp:    Resp: 33 32    Intake/Output Summary (Last 24 hours) at 11/30/15 1140 Last data filed at 11/30/15 1047  Gross per 24 hour  Intake    740 ml  Output   1825 ml  Net  -1085 ml   [employer]  11/29/15 0500 11/30/15 0558 11/30/15 0900  Weight: 61 kg (134 lb 7.7 oz) 62.6 kg (138 lb 0.1 oz) 61.1 kg (134 lb 11.2 oz)    Exam:   General:  Elderly male in distress with increased work of breathing  HEENT: Using accessory muscles of respiration  Chest: Diffuse wheezing bilaterally   Cardiovascular: Son and S2 tachycardic, no murmurs rub or gallop  GI: Soft, nondistended, nontender, bowel sounds present  Musculoskeletal: Warm, no edema   CNS: Alert and oriented 2  Data Reviewed: Basic Metabolic Panel:  Recent Labs Lab 11/26/15 2225 11/27/15 0446 11/28/15 0445  NA 139 139 136  K 3.9 3.7 4.0  CL 107 110 111  CO2 23 19* 20*  GLUCOSE 118* 122* 96  BUN 25* 25* 23*  CREATININE 1.44* 1.28* 1.11  CALCIUM 8.9 8.3* 8.1*   Liver Function Tests:  Recent Labs Lab 11/26/15 2225 11/27/15 0446  AST 28 42*  ALT 18 19  ALKPHOS 65 49  BILITOT 1.0 0.9  PROT 6.8 5.7*  ALBUMIN 4.2 3.3*    Recent Labs Lab 11/26/15 2225  LIPASE 29   No results for input(s): AMMONIA in the last 168 hours. CBC:  Recent Labs Lab 11/26/15 2225 11/27/15 0446 11/28/15 0445 11/29/15 0850  WBC 14.7* 13.4* 16.6* 13.1*  NEUTROABS 13.1*  --   --   --   HGB 13.0 11.3* 11.3* 11.4*  HCT 38.8* 33.7* 33.8* 34.0*  MCV 93.0 92.8 93.6 91.9  PLT 178 163 176 164   Cardiac Enzymes: No results for input(s): CKTOTAL, CKMB, CKMBINDEX, TROPONINI in the last 168 hours. BNP (last 3 results)  Recent Labs  11/27/15 0312  BNP 186.0*    ProBNP (last 3 results) No results for input(s): PROBNP in the last 8760 hours.  CBG:  Recent Labs Lab 11/27/15 0752 11/28/15 0747 11/29/15 0743 11/29/15 0822 11/30/15 0745  GLUCAP 120* 108* 66 92 84    Recent Results (from the past 240 hour(s))  Culture, blood (x 2)     Status: None (Preliminary result)   Collection Time: 11/26/15 10:25 PM  Result Value Ref Range Status   Specimen Description BLOOD LEFT ANTECUBITAL  Final   Special Requests  BOTTLES DRAWN AEROBIC AND ANAEROBIC 5ML  Final   Culture   Final    NO GROWTH 2 DAYS [facility]    Report Status PENDING  Incomplete  C difficile quick scan w PCR reflex     Status: None   Collection Time: 11/27/15 12:07 AM  Result Value Ref Range Status   C Diff antigen NEGATIVE NEGATIVE Final   C Diff toxin NEGATIVE NEGATIVE Final   C Diff interpretation Negative for toxigenic C. difficile  Final  Culture, blood (x 2)     Status: None (Preliminary result)   Collection Time: 11/27/15  4:30 AM  Result Value Ref Range Status   Specimen Description BLOOD LEFT WRIST  Final   Special Requests BOTTLES DRAWN AEROBIC AND ANAEROBIC 5ML  Final   Culture   Final    NO GROWTH 2 DAYS [facility] at [facility]     Report Status PENDING  Incomplete  MRSA PCR Screening     Status: None   Collection Time: 11/27/15 10:41 PM  Result Value Ref Range Status   MRSA by PCR NEGATIVE NEGATIVE Final    Comment:        The GeneXpert MRSA Assay (FDA approved for NASAL specimens only), is one component of a comprehensive MRSA  colonization surveillance program. It is not intended to diagnose MRSA infection nor to guide or monitor treatment for MRSA infections.      Studies: Dg Chest Port 1 View  11/30/2015  CLINICAL DATA:  Respiratory distress and possible aspiration pneumonia. Cough and congestion. EXAM: PORTABLE CHEST 1 VIEW COMPARISON:  11/28/2015 FINDINGS: Lungs are adequately inflated demonstrate persistent patchy airspace process over the right lung most prominent over the upper lobe with slight interval improvement likely due to pneumonia. Mild prominence of the perihilar markings unchanged. No definite effusion. Borderline cardiomegaly unchanged. Remainder of the exam is unchanged. IMPRESSION: Mild interval improvement of patchy airspace process over the right lung likely pneumonia. Cannot exclude a component of mild vascular congestion. Electronically Signed   By:  Elberta Fortis M.D.   On: 11/30/2015 10:01   Dg Swallowing Func-speech Pathology  11/29/2015  Objective Swallowing Evaluation:   Patient Details Name: John Montgomery MRN: [id number] Date of Birth: 1920/12/08 Today's Date: 11/29/2015 Time: SLP Start Time (ACUTE ONLY): 1431-SLP Stop Time (ACUTE ONLY): 1520 SLP Time Calculation (min) (ACUTE ONLY): 49 min Past Medical History: @PMH @ Past Surgical History: Past Surgical History Procedure Laterality Date . Angioplasty  2006   RCA stenting by Dr Elsie Lincoln HPI: 82 yo male adm to [facility] with nausea/vomiting/diarrhea and dyspnea x 3 days prior to admit, ? viral gastroenteritis.  Pt found to have Right lung pna.  Daughter present reports pt with recent issues with vomiting over the last 3 weeks.  Pt also with h/o SDH with right sided weakness.  HE admits to issues with drooling from right side of mouth even when awake over the last few months and coughing some with intake on food and drink.  Swallow eval ordered.   Subjective: pt awake in chair, daughter Diane with pt Assessment / Plan / Recommendation CHL IP CLINICAL IMPRESSIONS 11/29/2015 Therapy Diagnosis Moderate oral phase dysphagia;Moderate pharyngeal phase dysphagia;Mild cervical esophageal phase dysphagia Clinical Impression Pt presents with moderate oropharyngeal and suspected mild cervical esophageal dysphagia with sensorimotor deficits.  Weakness results in decreased oral cohesion and decreased posterior propulsion.  Premature spillage of nectar into pharynx to pyriform sinus noted x 1 via straw.  Pharyngeal swallow was characterized by good laryngeal elevation but poor pharyngeal contraction and tongue base retraction.  Said weakness results in residuals that pt does not consistently sense but is decreased with dry swallows *reflexive and cued.  Residuals were worse with increased viscocity.  Trace laryngeal penetration of thin noted to upper larynx only.  Pt's largest aspiration risk appears to be due to residuals that  pt has difficulty clearing. Using live video and verbal feedback, educated pt/daughter and aunt Darel Hong to findings/recommendations to MITIGATE risk. Advised daughter Sedalia Muta to suspicion of aspiration due to weakness and residuals.  Recommend continue full liquids = avoiding pudding/grits due to increased viscocity  Strict aspiration precautions recommeded. SLP to follow up. .   Impact on safety and function Moderate aspiration risk   CHL IP TREATMENT RECOMMENDATION 11/29/2015 Treatment Recommendations Therapy as outlined in treatment plan below   Prognosis 11/29/2015 Prognosis for Safe Diet Advancement Fair Barriers to Reach Goals Other (Comment) Barriers/Prognosis Comment -- CHL IP DIET RECOMMENDATION 11/29/2015 SLP Diet Recommendations Other (Comment) Liquid Administration via Straw;Spoon Medication Administration Crushed with puree Compensations Slow rate;Small sips/bites;Multiple dry swallows after each bite/sip;Other (Comment) Postural Changes --   CHL IP OTHER RECOMMENDATIONS 11/29/2015 Recommended Consults -- Oral Care Recommendations Oral care BID Other Recommendations --   CHL IP FOLLOW UP RECOMMENDATIONS 11/29/2015  Follow up Recommendations Home health SLP   CHL IP FREQUENCY AND DURATION 11/29/2015 Speech Therapy Frequency (ACUTE ONLY) min 2x/week Treatment Duration 1 week      CHL IP ORAL PHASE 11/29/2015 Oral Phase Impaired Oral - Pudding Teaspoon -- Oral - Pudding Cup -- Oral - Honey Teaspoon -- Oral - Honey Cup -- Oral - Nectar Teaspoon Reduced posterior propulsion;Weak lingual manipulation Oral - Nectar Cup -- Oral - Nectar Straw Weak lingual manipulation;Reduced posterior propulsion;Premature spillage Oral - Thin Teaspoon Weak lingual manipulation;Reduced posterior propulsion;Decreased bolus cohesion;Premature spillage Oral - Thin Cup -- Oral - Thin Straw Weak lingual manipulation;Reduced posterior propulsion;Decreased bolus cohesion Oral - Puree Weak lingual manipulation;Reduced posterior propulsion;Delayed  oral transit Oral - Mech Soft -- Oral - Regular Weak lingual manipulation;Impaired mastication;Reduced posterior propulsion;Delayed oral transit Oral - Multi-Consistency -- Oral - Pill -- Oral Phase - Comment --  CHL IP PHARYNGEAL PHASE 11/29/2015 Pharyngeal Phase Impaired Pharyngeal- Pudding Teaspoon -- Pharyngeal -- Pharyngeal- Pudding Cup -- Pharyngeal -- Pharyngeal- Honey Teaspoon -- Pharyngeal -- Pharyngeal- Honey Cup -- Pharyngeal -- Pharyngeal- Nectar Teaspoon Reduced pharyngeal peristalsis;Reduced tongue base retraction;Pharyngeal residue - valleculae;Pharyngeal residue - pyriform;Pharyngeal residue - posterior pharnyx Pharyngeal -- Pharyngeal- Nectar Cup -- Pharyngeal -- Pharyngeal- Nectar Straw Reduced pharyngeal peristalsis;Reduced tongue base retraction;Pharyngeal residue - valleculae;Pharyngeal residue - pyriform;Pharyngeal residue - posterior pharnyx Pharyngeal -- Pharyngeal- Thin Teaspoon Reduced pharyngeal peristalsis;Reduced tongue base retraction;Pharyngeal residue - valleculae;Pharyngeal residue - pyriform Pharyngeal -- Pharyngeal- Thin Cup -- Pharyngeal -- Pharyngeal- Thin Straw Reduced pharyngeal peristalsis;Reduced tongue base retraction;Pharyngeal residue - valleculae;Pharyngeal residue - pyriform;Pharyngeal residue - posterior pharnyx Pharyngeal -- Pharyngeal- Puree Reduced pharyngeal peristalsis;Reduced tongue base retraction;Pharyngeal residue - valleculae;Pharyngeal residue - pyriform Pharyngeal -- Pharyngeal- Mechanical Soft -- Pharyngeal -- Pharyngeal- Regular Reduced pharyngeal peristalsis;Reduced tongue base retraction;Pharyngeal residue - valleculae;Pharyngeal residue - pyriform Pharyngeal -- Pharyngeal- Multi-consistency -- Pharyngeal -- Pharyngeal- Pill -- Pharyngeal -- Pharyngeal Comment --  CHL IP CERVICAL ESOPHAGEAL PHASE 11/29/2015 Cervical Esophageal Phase Impaired Pudding Teaspoon -- Pudding Cup -- Honey Teaspoon -- Honey Cup -- Nectar Teaspoon -- Nectar Cup -- Nectar Straw --  Thin Teaspoon -- Thin Cup -- Thin Straw -- Puree -- Mechanical Soft -- Regular -- Multi-consistency -- Pill -- Cervical Esophageal Comment mild amount of residuals at UES without pt awareness, appearance of secretions mixing with barium Donavan Burnet, MS [facility] SLP [phone]               Scheduled Meds: . atorvastatin  20 mg Oral QHS  . beta carotene w/minerals  1 tablet Oral Daily  . ceFEPime (MAXIPIME) IV  1 g Intravenous Q12H  . cholecalciferol  1,000 Units Oral Daily  . dextromethorphan-guaiFENesin  1 tablet Oral BID  . famotidine (PEPCID) IV  20 mg Intravenous Q12H  . heparin  5,000 Units Subcutaneous 3 times per day  . levalbuterol  1.25 mg Nebulization Q4H  . metoprolol tartrate  12.5 mg Oral BID  . metronidazole  500 mg Intravenous Q8H  . sodium chloride  3 mL Intravenous Q12H  . sodium fluoride  1 application dental Daily   Continuous Infusions:      Time spent: 35 minutes    Sahira Cataldi  [facility] Pager [phone] If 7PM-7AM, please contact night-coverage at [email or web address], password [facility] 11/30/2015, 11:40 AM  LOS: 3 days

## 2015-11-30 NOTE — Progress Notes (Signed)
Rapid response transferred pt to ICU.  Lenox PondsJerry L Encarnacion Scioneaux, RN

## 2015-11-30 NOTE — Significant Event (Signed)
Rapid Response Event Note  Overview: Arrival Time: 0830 Event Type: Respiratory  Initial Focused Assessment: Called to check on pt in resp distress. Pt currently on 55 VM, audible wheezing. Resp rate 29, sats 98%.     Interventions: Placed on monitor, Dr Gonzella Lexhungel came and spoke with son, pt made DNR.  Will transfer to SD for closer monitoring. ABG & PCXR to be done.    Event Summary: Transferred to rm 1234 via bed.    at      at          [name], John KehrStephanie Montgomery

## 2015-11-30 NOTE — Plan of Care (Signed)
Problem: Safety: Goal: Ability to remain free from injury will improve Outcome: Progressing Bed is in lowest position.  Bed alarm is on. Call light is within reach of patient.  Patient's family educated about call bell.

## 2015-12-01 LAB — GLUCOSE, CAPILLARY: GLUCOSE-CAPILLARY: 79 mg/dL (ref 65–99)

## 2015-12-01 MED ORDER — METOPROLOL TARTRATE 1 MG/ML IV SOLN
5.0000 mg | Freq: Once | INTRAVENOUS | Status: AC
  Administered 2015-12-01: 5 mg via INTRAVENOUS
  Filled 2015-12-01: qty 5

## 2015-12-01 NOTE — Progress Notes (Signed)
[facility] PROGRESS NOTE  John Montgomery XBJ:[id number] DOB: 17-Jan-1921 DOA: 11/26/2015 PCP: Junious Silk, MD  Brief narrative 82 year old male with history of hypertension, hyperlipidemia, CAD status post stent, bilateral carotid artery stenosis, see daily stage II, history of subdural hematoma, paroxysmal A. fib, diastolic CHF, GERD, anxiety presented to skilled nursing facility presented with acute onset of nausea, vomiting, diarrhea shortness of breath. Patient was also found to be encephalopathic on presentation. Reportedly patient was having nausea with vomiting and diarrhea for the past 3 days and had some shortness of breath. His oxygen saturation decreased to 82% in the ED. Was found to have elevated lactate of 2.05, BNP of 186, leukocytosis with WBC of 14.7 and low-grade fever of 90 and 80 Fahrenheit with acute on chronic kidney injury. Chest x-ray, UA and stool for C. difficile were negative. CT of his abdomen pelvis showed mild gaseous gastric distention with mesenteric edema with multiple small mesenteric lymph nodes. Ultrasound showed moderate bilateral inguinal hernias containing small bowel and colon without inflammation or obstruction. Admitted for further management.  Overnight on 1/12 patient became increasingly confused and agitated. Was tachypnea and hypoxic requiring 8 L Venturi mask. Chest x-ray was done which showed progressive bilateral pulmonary infiltrates prominent throughout the right lung consistent with multifocal pneumonia. Also suggested possible pulmonary edema.  On 1/15 patient then had respiratory distress with increased work of breathing. Was actively wheezing and placed on Venturi mask. Rapid response called. Patient transferred to stepdown unit.   Assessment/Plan: Sepsis with acute encephalopathy Possibly associated with acute viral gastritis/gastroenteritis followed by right lobar pneumonia. Patient became hypoxic in the hospital requiring  Ventimask. Chest x-ray showed multifocal pneumonia on right lung.  -Antibiotic broadened to IV cefepime. Continue Flagyl. Stool for C. difficile and blood cultures negative. -Mentation improved but patient having occasional agitation.  Acute hypoxic respiratory failure Likely secondary to underlying pneumonia with volume overload. Cor step down monitoring on 1/15 for respiratory distress. Receiving IV Lasix as needed Transfer to stepdown unit on Ventimask. Has some pulmonary edema on chest x-ray. Receiving IV Lasix when necessary. Antibiotics  broadened to IV cefepime. blood culture negative. Flu PCR negative.  -Currently on 4 L nasal cannula, with periodic wheezing. -Discussed at length with patient's daughter regarding his condition. Daughter agrees that he may be lining progressively. Has significant underlying cardiac history and is wheelchair-bound at his assisted-living. I discussed with her that patient may not benefit from further escalation of care and she understands. We'll continue with breathing treatments, oxygen and antibiotics for now.  -he will benefit from going to skilled nursing facility with palliative care follow-up. Family agreed.  ?aspiration pneumonia with dysphagia Seen by speech nurse and patient has moderate oral pharyngeal phase dysphagia. He is at high risk for aspiration.   Essential hypertension Continue metoprolol. Amlodipine held due to soft blood pressure   Coronary artery disease History of stent. Continue metoprolol.  Diastolic dysfunction Received maintenance fluids given hypovolemia on presentation. Now getting when necessary Lasix for fluid overload. Continue beta blocker.  Paroxysmal A. fib Heart rate in the low 100s. Continue beta blocker. Not a candidate for anticoagulation.  Acute on chronic kidney disease stage II Secondary to sepsis and dehydration. Resolved with fluids.  GERD On Pepcid  Diet: Full liquid  DVT prophylaxis  Code Status:  DO NOT RESUSCITATE.  Family Communication: Daughter at bedside  Disposition Plan: Transfer to stepdown unit.   Consultants:  None  Procedures:  Head CT  CT abdomen and pelvis  Antibiotics:  Cipro and Flagyl 1/12--  IV cefepime 1/13--  HPI/Subjective: Seen and examined. Transfer out of step down yesterday. Maintaining sats on nasal cannula but wheezy at times. Was sleepy on my evaluation this morning after receiving Ativan early this morning.  Objective: Filed Vitals:   12/01/15 0242 12/01/15 1114  BP: 143/66   Pulse: 117   Temp: 98.2 F (36.8 C) 98.6 F (37 C)  Resp: 24     Intake/Output Summary (Last 24 hours) at 12/01/15 1331 Last data filed at 12/01/15 1111  Gross per 24 hour  Intake    150 ml  Output   1425 ml  Net  -1275 ml   Filed Weights   11/29/15 0500 11/30/15 0558 11/30/15 0900  Weight: 61 kg (134 lb 7.7 oz) 62.6 kg (138 lb 0.1 oz) 61.1 kg (134 lb 11.2 oz)    Exam:   General:  Elderly male in bed somnolent  HEENT: Moist mucosa  Chest: Diffuse wheezing bilaterally   Cardiovascular: S1 and S2 irregularly irregular no murmurs rub or gallop  GI: Soft, nondistended, nontender, bowel sounds present  Musculoskeletal: Warm, no edema   CNS: Somnolent  Data Reviewed: Basic Metabolic Panel:  Recent Labs Lab 11/26/15 2225 11/27/15 0446 11/28/15 0445  NA 139 139 136  K 3.9 3.7 4.0  CL 107 110 111  CO2 23 19* 20*  GLUCOSE 118* 122* 96  BUN 25* 25* 23*  CREATININE 1.44* 1.28* 1.11  CALCIUM 8.9 8.3* 8.1*   Liver Function Tests:  Recent Labs Lab 11/26/15 2225 11/27/15 0446  AST 28 42*  ALT 18 19  ALKPHOS 65 49  BILITOT 1.0 0.9  PROT 6.8 5.7*  ALBUMIN 4.2 3.3*    Recent Labs Lab 11/26/15 2225  LIPASE 29   No results for input(s): AMMONIA in the last 168 hours. CBC:  Recent Labs Lab 11/26/15 2225 11/27/15 0446 11/28/15 0445 11/29/15 0850  WBC 14.7* 13.4* 16.6* 13.1*  NEUTROABS 13.1*  --   --   --   HGB 13.0 11.3*  11.3* 11.4*  HCT 38.8* 33.7* 33.8* 34.0*  MCV 93.0 92.8 93.6 91.9  PLT 178 163 176 164   Cardiac Enzymes: No results for input(s): CKTOTAL, CKMB, CKMBINDEX, TROPONINI in the last 168 hours. BNP (last 3 results)  Recent Labs  11/27/15 0312  BNP 186.0*    ProBNP (last 3 results) No results for input(s): PROBNP in the last 8760 hours.  CBG:  Recent Labs Lab 11/28/15 0747 11/29/15 0743 11/29/15 0822 11/30/15 0745 12/01/15 0839  GLUCAP 108* 66 92 84 79    Recent Results (from the past 240 hour(s))  Culture, blood (x 2)     Status: None (Preliminary result)   Collection Time: 11/26/15 10:25 PM  Result Value Ref Range Status   Specimen Description BLOOD LEFT ANTECUBITAL  Final   Special Requests BOTTLES DRAWN AEROBIC AND ANAEROBIC  Final   Culture   Final    NO GROWTH 4 DAYS Performed at [facility]    Report Status PENDING  Incomplete  C difficile quick scan w PCR reflex     Status: None   Collection Time: 11/27/15 12:07 AM  Result Value Ref Range Status   C Diff antigen NEGATIVE NEGATIVE Final   C Diff toxin NEGATIVE NEGATIVE Final   C Diff interpretation Negative for toxigenic C. difficile  Final  Culture, blood (x 2)     Status: None (Preliminary result)   Collection Time: 11/27/15  4:30 AM  Result  Value Ref Range Status   Specimen Description BLOOD LEFT WRIST  Final   Special Requests BOTTLES DRAWN AEROBIC AND ANAEROBIC  Final   Culture   Final    NO GROWTH 4 DAYS Performed at [facility]    Report Status PENDING  Incomplete  MRSA PCR Screening     Status: None   Collection Time: 11/27/15 10:41 PM  Result Value Ref Range Status   MRSA by PCR NEGATIVE NEGATIVE Final    Comment:        The GeneXpert MRSA Assay (FDA approved for NASAL specimens only), is one component of a comprehensive MRSA colonization surveillance program. It is not intended to diagnose MRSA infection nor to guide or monitor treatment for MRSA infections.       Studies: Dg Chest Port 1 View  11/30/2015  CLINICAL DATA:  Respiratory distress and possible aspiration pneumonia. Cough and congestion. EXAM: PORTABLE CHEST 1 VIEW COMPARISON:  11/28/2015 FINDINGS: Lungs are adequately inflated demonstrate persistent patchy airspace process over the right lung most prominent over the upper lobe with slight interval improvement likely due to pneumonia. Mild prominence of the perihilar markings unchanged. No definite effusion. Borderline cardiomegaly unchanged. Remainder of the exam is unchanged. IMPRESSION: Mild interval improvement of patchy airspace process over the right lung likely pneumonia. Cannot exclude a component of mild vascular congestion. Electronically Signed   By: Elberta Fortis M.D.   On: 11/30/2015 10:01   Dg Swallowing Func-speech Pathology  11/29/2015  Objective Swallowing Evaluation:   Patient Details Name: MILON DETHLOFF MRN: [id number] Date of Birth: 23-Apr-1921 Today's Date: 11/29/2015 Time: SLP Start Time (ACUTE ONLY): 1431-SLP Stop Time (ACUTE ONLY): 1520 SLP Time Calculation (min) (ACUTE ONLY): 49 min Past Medical History: @PMH @ Past Surgical History: Past Surgical History Procedure Laterality Date . Angioplasty  2006   RCA stenting by Dr Elsie Lincoln HPI: 82 yo male adm to [facility] with nausea/vomiting/diarrhea and dyspnea x 3 days prior to admit, ? viral gastroenteritis.  Pt found to have Right lung pna.  Daughter present reports pt with recent issues with vomiting over the last 3 weeks.  Pt also with h/o SDH with right sided weakness.  HE admits to issues with drooling from right side of mouth even when awake over the last few months and coughing some with intake on food and drink.  Swallow eval ordered.   Subjective: pt awake in chair, daughter Diane with pt Assessment / Plan / Recommendation CHL IP CLINICAL IMPRESSIONS 11/29/2015 Therapy Diagnosis Moderate oral phase dysphagia;Moderate pharyngeal phase dysphagia;Mild cervical esophageal phase dysphagia  Clinical Impression Pt presents with moderate oropharyngeal and suspected mild cervical esophageal dysphagia with sensorimotor deficits.  Weakness results in decreased oral cohesion and decreased posterior propulsion.  Premature spillage of nectar into pharynx to pyriform sinus noted x 1 via straw.  Pharyngeal swallow was characterized by good laryngeal elevation but poor pharyngeal contraction and tongue base retraction.  John weakness results in residuals that pt does not consistently sense but is decreased with dry swallows *reflexive and cued.  Residuals were worse with increased viscocity.  Trace laryngeal penetration of thin noted to upper larynx only.  Pt's largest aspiration risk appears to be due to residuals that pt has difficulty clearing. Using live video and verbal feedback, educated pt/daughter and aunt Darel Hong to findings/recommendations to MITIGATE risk. Advised daughter Sedalia Muta to suspicion of aspiration due to weakness and residuals.  Recommend continue full liquids = avoiding pudding/grits due to increased viscocity  Strict aspiration precautions  recommeded. SLP to follow up. .   Impact on safety and function Moderate aspiration risk   CHL IP TREATMENT RECOMMENDATION 11/29/2015 Treatment Recommendations Therapy as outlined in treatment plan below   Prognosis 11/29/2015 Prognosis for Safe Diet Advancement Fair Barriers to Reach Goals Other (Comment) Barriers/Prognosis Comment -- CHL IP DIET RECOMMENDATION 11/29/2015 SLP Diet Recommendations Other (Comment) Liquid Administration via Straw;Spoon Medication Administration Crushed with puree Compensations Slow rate;Small sips/bites;Multiple dry swallows after each bite/sip;Other (Comment) Postural Changes --   CHL IP OTHER RECOMMENDATIONS 11/29/2015 Recommended Consults -- Oral Care Recommendations Oral care BID Other Recommendations --   CHL IP FOLLOW UP RECOMMENDATIONS 11/29/2015 Follow up Recommendations Home health SLP   CHL IP FREQUENCY AND DURATION  11/29/2015 Speech Therapy Frequency (ACUTE ONLY) min 2x/week Treatment Duration 1 week      CHL IP ORAL PHASE 11/29/2015 Oral Phase Impaired Oral - Pudding Teaspoon -- Oral - Pudding Cup -- Oral - Honey Teaspoon -- Oral - Honey Cup -- Oral - Nectar Teaspoon Reduced posterior propulsion;Weak lingual manipulation Oral - Nectar Cup -- Oral - Nectar Straw Weak lingual manipulation;Reduced posterior propulsion;Premature spillage Oral - Thin Teaspoon Weak lingual manipulation;Reduced posterior propulsion;Decreased bolus cohesion;Premature spillage Oral - Thin Cup -- Oral - Thin Straw Weak lingual manipulation;Reduced posterior propulsion;Decreased bolus cohesion Oral - Puree Weak lingual manipulation;Reduced posterior propulsion;Delayed oral transit Oral - Mech Soft -- Oral - Regular Weak lingual manipulation;Impaired mastication;Reduced posterior propulsion;Delayed oral transit Oral - Multi-Consistency -- Oral - Pill -- Oral Phase - Comment --  CHL IP PHARYNGEAL PHASE 11/29/2015 Pharyngeal Phase Impaired Pharyngeal- Pudding Teaspoon -- Pharyngeal -- Pharyngeal- Pudding Cup -- Pharyngeal -- Pharyngeal- Honey Teaspoon -- Pharyngeal -- Pharyngeal- Honey Cup -- Pharyngeal -- Pharyngeal- Nectar Teaspoon Reduced pharyngeal peristalsis;Reduced tongue base retraction;Pharyngeal residue - valleculae;Pharyngeal residue - pyriform;Pharyngeal residue - posterior pharnyx Pharyngeal -- Pharyngeal- Nectar Cup -- Pharyngeal -- Pharyngeal- Nectar Straw Reduced pharyngeal peristalsis;Reduced tongue base retraction;Pharyngeal residue - valleculae;Pharyngeal residue - pyriform;Pharyngeal residue - posterior pharnyx Pharyngeal -- Pharyngeal- Thin Teaspoon Reduced pharyngeal peristalsis;Reduced tongue base retraction;Pharyngeal residue - valleculae;Pharyngeal residue - pyriform Pharyngeal -- Pharyngeal- Thin Cup -- Pharyngeal -- Pharyngeal- Thin Straw Reduced pharyngeal peristalsis;Reduced tongue base retraction;Pharyngeal residue -  valleculae;Pharyngeal residue - pyriform;Pharyngeal residue - posterior pharnyx Pharyngeal -- Pharyngeal- Puree Reduced pharyngeal peristalsis;Reduced tongue base retraction;Pharyngeal residue - valleculae;Pharyngeal residue - pyriform Pharyngeal -- Pharyngeal- Mechanical Soft -- Pharyngeal -- Pharyngeal- Regular Reduced pharyngeal peristalsis;Reduced tongue base retraction;Pharyngeal residue - valleculae;Pharyngeal residue - pyriform Pharyngeal -- Pharyngeal- Multi-consistency -- Pharyngeal -- Pharyngeal- Pill -- Pharyngeal -- Pharyngeal Comment --  CHL IP CERVICAL ESOPHAGEAL PHASE 11/29/2015 Cervical Esophageal Phase Impaired Pudding Teaspoon -- Pudding Cup -- Honey Teaspoon -- Honey Cup -- Nectar Teaspoon -- Nectar Cup -- Nectar Straw -- Thin Teaspoon -- Thin Cup -- Thin Straw -- Puree -- Mechanical Soft -- Regular -- Multi-consistency -- Pill -- Cervical Esophageal Comment mild amount of residuals at UES without pt awareness, appearance of secretions mixing with barium Donavan Burnetamara Kimball, MS [facility] SLP [phone]               Scheduled Meds: . atorvastatin  20 mg Oral QHS  . beta carotene w/minerals  1 tablet Oral Daily  . ceFEPime (MAXIPIME) IV  1 g Intravenous Q12H  . cholecalciferol  1,000 Units Oral Daily  . dextromethorphan-guaiFENesin  1 tablet Oral BID  . famotidine (PEPCID) IV  20 mg Intravenous Q12H  . heparin  5,000 Units Subcutaneous 3 times per day  . levalbuterol  1.25 mg Nebulization  4 times per day  . metoprolol tartrate  12.5 mg Oral BID  . metronidazole  500 mg Intravenous Q8H  . sodium chloride  3 mL Intravenous Q12H   Continuous Infusions:      Time spent: 25 minutes    Britni Driscoll  [facility] Pager [phone] If 7PM-7AM, please contact night-coverage at [email or web address], password [facility] 12/01/2015, 1:31 PM  LOS: 4 days

## 2015-12-01 NOTE — Evaluation (Signed)
Physical Therapy Evaluation Patient Details Name: John Montgomery MRN: [id number] DOB: 05/31/1921 Today's Date: 12/01/2015   History of Present Illness  82 year old male with history of hypertension, hyperlipidemia, CAD status post stent, bilateral carotid artery stenosis, see daily stage II, history of subdural hematoma, paroxysmal A. fib, diastolic CHF, GERD, anxiety presented to skilled nursing facility presented with acute onset of nausea, vomiting, diarrhea shortness of breath. Patient was also found to be encephalopathic on presentation  Clinical Impression  Pt admitted with above diagnosis. Pt currently with functional limitations due to the deficits listed below (see PT Problem List).  Pt will benefit from skilled PT to increase their independence and safety with mobility to allow discharge to the venue listed below.   Recommend SNF     Follow Up Recommendations SNF;Supervision/Assistance - 24 hour    Equipment Recommendations  None recommended by PT    Recommendations for Other Services       Precautions / Restrictions Precautions Precautions: Fall      Mobility  Bed Mobility Overal bed mobility: Needs Assistance;+2 for physical assistance;+ 2 for safety/equipment Bed Mobility: Supine to Sit;Sit to Supine     Supine to sit: +2 for physical assistance;Total assist Sit to supine: +2 for physical assistance;Total assist   General bed mobility comments: +2 for LEs, UB  Transfers                 General transfer comment: NT d/t lethargy  Ambulation/Gait                Stairs            Wheelchair Mobility    Modified Rankin (Stroke Patients Only)       Balance Overall balance assessment: Needs assistance Sitting-balance support: No upper extremity supported;Feet supported Sitting balance-Leahy Scale: Zero   Postural control: Right lateral lean                                   Pertinent Vitals/Pain Pain Assessment:  No/denies pain (unable to fully assess; seems to say no when asked)    Home Living Family/patient expects to be discharged to:: Skilled nursing facility Living Arrangements: Other (Comment)             Home Equipment: Dan Humphreys - 2 wheels;Wheelchair - manual Additional Comments: pt [facility] ALF, [facility]) for ~ 2 yrs per pt dtr    Prior Function Level of Independence: Independent with assistive device(s);Independent         Comments: pt was I with ADLs but took an excessive amount of time per pt dtr (2hrs); he mostly used w/c, amb short distances with RW;      Hand Dominance        Extremity/Trunk Assessment   Upper Extremity Assessment: Defer to OT evaluation           Lower Extremity Assessment: Generalized weakness (PROM grossly WFL)      Cervical / Trunk Assessment: Kyphotic;Other exceptions  Communication   Communication:  (expressive difficulty per previous notes)  Cognition Arousal/Alertness: Lethargic;Suspect due to medications Behavior During Therapy: Agitated (easily agitated) Overall Cognitive Status: Difficult to assess Area of Impairment: Following commands       Following Commands: Follows one step commands inconsistently            General Comments      Exercises        Assessment/Plan  PT Assessment Patient needs continued PT services  PT Diagnosis Generalized weakness;Hemiplegia dominant side   PT Problem List Decreased strength;Decreased activity tolerance;Decreased balance;Decreased mobility  PT Treatment Interventions Functional mobility training;Therapeutic activities;Patient/family education;Therapeutic exercise;Balance training   PT Goals (Current goals can be found in the Care Plan section) Acute Rehab PT Goals Patient Stated Goal: pt unable to state PT Goal Formulation: Patient unable to participate in goal setting Time For Goal Achievement: 12/15/15 Potential to Achieve Goals: Fair    Frequency Min  3X/week   Barriers to discharge        Co-evaluation               End of Session   Activity Tolerance: Patient limited by lethargy Patient left: in bed;with call bell/phone within reach;with bed alarm set;with family/visitor present Nurse Communication: Mobility status         Time: [id number] PT Time Calculation (min) (ACUTE ONLY): 21 min   Charges:   PT Evaluation $PT Eval Moderate Complexity: 1 Procedure     PT G Codes:        John Montgomery 12/01/2015, 2:13 PM

## 2015-12-01 NOTE — Progress Notes (Signed)
OT Cancellation Note  Patient Details Name: John Montgomery MRN: [id number] DOB: Sep 10, 1921   Cancelled Treatment:    Reason Eval/Treat Not Completed: Other (comment) -- Chart reviewed and spoke to PT and SW. The discharge plan is SNF. Will defer OT needs to OT at SNF. Thank you.  Davina Howlett A 12/01/2015, 2:50 PM

## 2015-12-01 NOTE — Progress Notes (Signed)
Paged hospitalist d/t increased BP 143/106 and HR 110, has po meds ordered however he is unable to safely swallow pills.  Awaiting response

## 2015-12-01 NOTE — Progress Notes (Signed)
CSW assisting with d/c planning. PT eval pending. MD / Daughter feel SNF placement will be needed at d/c. SNF search initiated and bed offers are pending. CSW will continue to follow to assist with d/c planning needs.  Cori RazorJamie Deric Bocock LCSW [phone]

## 2015-12-01 NOTE — [place] FL2 (Deleted)
[place] MEDICAID FL2 LEVEL OF CARE SCREENING TOOL     IDENTIFICATION  Patient Name: John Montgomery Birthdate: 1921-06-30 Sex: male Admission Date (Current Location): 11/26/2015  [facility] [place]   Facility and Address:  [facility], [address], [place], [place] [address]      Provider Number: [id number]  Attending Physician Name and Address:  Eddie NorthNishant Dhungel, MD  Relative Name and Phone Number:       Current Level of Care: Hospital Recommended Level of Care: Assisted Living Facility Prior Approval Number:    Date Approved/Denied:   PASRR Number:    Discharge Plan: Domiciliary (Rest home) (ALF)    Current Diagnoses: Patient Active Problem List   Diagnosis Date Noted  . Nausea vomiting and diarrhea 11/27/2015  . Acute respiratory failure with hypoxia (HCC) 11/27/2015  . Cough 11/27/2015  . Acute-on-chronic kidney injury (HCC) 11/27/2015  . Sepsis (HCC) 11/27/2015  . Acute encephalopathy 11/27/2015  . Right flaccid hemiparesis (HCC) 10/31/2013  . Fx mult cervical vert-closed (HCC) 04/10/2013  . PAF (paroxysmal atrial fibrillation) (HCC) 10/19/2012  . First degree AV block 10/19/2012  . SDH (subdural hematoma) (HCC) 10/19/2012  . HTN (hypertension) 10/19/2012  . CAD, RCA PTCA '98, RCA DES '06. Low risk Nuc 2011 10/19/2012  . Dyslipidemia 10/19/2012  . PVD, moderate RICA disease 10/19/2012  . Diastolic dysfunction, grade 1 with an EF of 50-55% 2011 10/19/2012    Orientation RESPIRATION BLADDER Height & Weight    Self  O2 (4L) Continent   134 lbs.  BEHAVIORAL SYMPTOMS/MOOD NEUROLOGICAL BOWEL NUTRITION STATUS      Continent Diet  AMBULATORY STATUS COMMUNICATION OF NEEDS Skin   Extensive Assist Verbally Normal                       Personal Care Assistance Level of Assistance  Bathing, Feeding, Dressing Bathing Assistance: Limited assistance Feeding assistance: Limited assistance Dressing Assistance:  Limited assistance     Functional Limitations Info             SPECIAL CARE FACTORS FREQUENCY  PT (By licensed PT), OT (By licensed OT), Speech therapy                    Contractures      Additional Factors Info  Allergies Code Status Info: Full Code Allergies Info: Penicillins           Current Medications (12/01/2015):  This is the current hospital active medication list Current Facility-Administered Medications  Medication Dose Route Frequency Provider Last Rate Last Dose  . acetaminophen (TYLENOL) tablet 650 mg  650 mg Oral Q6H PRN Lorretta HarpXilin Niu, MD   650 mg at 11/27/15 0423   Or  . acetaminophen (TYLENOL) suppository 650 mg  650 mg Rectal Q6H PRN Lorretta HarpXilin Niu, MD      . atorvastatin (LIPITOR) tablet 20 mg  20 mg Oral QHS Lorretta HarpXilin Niu, MD   20 mg at 11/28/15 2117  . baclofen (LIORESAL) tablet 5 mg  5 mg Oral BID PRN Lorretta HarpXilin Niu, MD   5 mg at 11/28/15 1227  . beta carotene w/minerals (OCUVITE) tablet 1 tablet  1 tablet Oral Daily Lorretta HarpXilin Niu, MD   1 tablet at 11/29/15 1045  . ceFEPIme (MAXIPIME) 1 g in dextrose 5 % 50 mL IVPB  1 g Intravenous Q12H Christine E Shade, RPH   1 g at 12/01/15 1211  . cholecalciferol (VITAMIN D) tablet 1,000  Units  1,000 Units Oral Daily Lorretta Harp, MD   1,000 Units at 11/29/15 1045  . dextromethorphan-guaiFENesin (MUCINEX DM) 30-600 MG per 12 hr tablet 1 tablet  1 tablet Oral BID Lorretta Harp, MD   1 tablet at 11/29/15 1045  . diazepam (VALIUM) tablet 2 mg  2 mg Oral Q8H PRN Lorretta Harp, MD   2 mg at 11/28/15 1341  . famotidine (PEPCID) IVPB 20 mg premix  20 mg Intravenous Q12H Lorretta Harp, MD 100 mL/hr at 12/01/15 1210 20 mg at 12/01/15 1210  . heparin injection 5,000 Units  5,000 Units Subcutaneous 3 times per day Lorretta Harp, MD   5,000 Units at 12/01/15 0509  . levalbuterol (XOPENEX) nebulizer solution 0.63 mg  0.63 mg Nebulization Q4H PRN Nishant Dhungel, MD      . levalbuterol (XOPENEX) nebulizer solution 1.25 mg  1.25 mg Nebulization 4 times per day  Nishant Dhungel, MD   1.25 mg at 12/01/15 0810  . LORazepam (ATIVAN) injection 1 mg  1 mg Intravenous Q6H PRN Nishant Dhungel, MD   1 mg at 12/01/15 0715  . metoprolol tartrate (LOPRESSOR) tablet 12.5 mg  12.5 mg Oral BID Lorretta Harp, MD   12.5 mg at 11/30/15 1011  . metroNIDAZOLE (FLAGYL) IVPB 500 mg  500 mg Intravenous Q8H Lorretta Harp, MD 100 mL/hr at 12/01/15 0507 500 mg at 12/01/15 0507  . ondansetron (ZOFRAN) injection 4 mg  4 mg Intravenous Q8H PRN Lorretta Harp, MD      . sodium chloride 0.9 % injection 3 mL  3 mL Intravenous Q12H Lorretta Harp, MD   3 mL at 11/30/15 1000  . traMADol (ULTRAM) tablet 50 mg  50 mg Oral Q8H PRN Nishant Dhungel, MD   50 mg at 11/29/15 1809  . TRIPLE ANTIBIOTIC [phone] OINT 1 application  1 application Apply externally BID PRN Lorretta Harp, MD         Discharge Medications: Please see discharge summary for a list of discharge medications.  Relevant Imaging Results:  Relevant Lab Results:   Additional Information SSN [id number]  Vanita Cannell, Dickey Gave, LCSW

## 2015-12-01 NOTE — [place] FL2 (Signed)
[place] MEDICAID FL2 LEVEL OF CARE SCREENING TOOL     IDENTIFICATION  Patient Name: John Montgomery Birthdate: Sep 11, 1921 Sex: male Admission Date (Current Location): 11/26/2015  [facility] and [place] Number:  [place] and Address:  [facility],  [address], [place] [address]      Provider Number: [id number]  Attending Physician Name and Address:  Eddie North, MD  Relative Name and Phone Number:       Current Level of Care: Hospital Recommended Level of Care: Skilled Nursing Facility Prior Approval Number:    Date Approved/Denied:   PASRR Number: [id number]  Discharge Plan: SNF    Current Diagnoses: Patient Active Problem List   Diagnosis Date Noted  . Nausea vomiting and diarrhea 11/27/2015  . Acute respiratory failure with hypoxia (HCC) 11/27/2015  . Cough 11/27/2015  . Acute-on-chronic kidney injury (HCC) 11/27/2015  . Sepsis (HCC) 11/27/2015  . Acute encephalopathy 11/27/2015  . Right flaccid hemiparesis (HCC) 10/31/2013  . Fx mult cervical vert-closed (HCC) 04/10/2013  . PAF (paroxysmal atrial fibrillation) (HCC) 10/19/2012  . First degree AV block 10/19/2012  . SDH (subdural hematoma) (HCC) 10/19/2012  . HTN (hypertension) 10/19/2012  . CAD, RCA PTCA '98, RCA DES '06. Low risk Nuc 2011 10/19/2012  . Dyslipidemia 10/19/2012  . PVD, moderate RICA disease 10/19/2012  . Diastolic dysfunction, grade 1 with an EF of 50-55% 2011 10/19/2012    Orientation RESPIRATION BLADDER Height & Weight    Self  O2 Incontinent   134 lbs.  BEHAVIORAL SYMPTOMS/MOOD NEUROLOGICAL BOWEL NUTRITION STATUS  Other (Comment) (no behaviors)   Incontinent Diet  AMBULATORY STATUS COMMUNICATION OF NEEDS Skin   Extensive Assist Verbally Normal                       Personal Care Assistance Level of Assistance  Bathing, Feeding, Dressing Bathing Assistance: Limited assistance Feeding assistance: Limited assistance Dressing Assistance: Limited  assistance     Functional Limitations Info  Sight, Hearing, Speech Sight Info: Adequate Hearing Info: Adequate Speech Info: Adequate    SPECIAL CARE FACTORS FREQUENCY  PT (By licensed PT), OT (By licensed OT)     PT Frequency: 5 x wk OT Frequency: 5 x wk            Contractures Contractures Info: Not present    Additional Factors Info  Code Status Code Status Info: DNR Allergies Info: Penicillins           Current Medications (12/01/2015):  This is the current hospital active medication list Current Facility-Administered Medications  Medication Dose Route Frequency Provider Last Rate Last Dose  . acetaminophen (TYLENOL) tablet 650 mg  650 mg Oral Q6H PRN Lorretta Harp, MD   650 mg at 11/27/15 0423   Or  . acetaminophen (TYLENOL) suppository 650 mg  650 mg Rectal Q6H PRN Lorretta Harp, MD      . atorvastatin (LIPITOR) tablet 20 mg  20 mg Oral QHS Lorretta Harp, MD   20 mg at 11/28/15 2117  . baclofen (LIORESAL) tablet 5 mg  5 mg Oral BID PRN Lorretta Harp, MD   5 mg at 11/28/15 1227  . beta carotene w/minerals (OCUVITE) tablet 1 tablet  1 tablet Oral Daily Lorretta Harp, MD   1 tablet at 11/29/15 1045  . ceFEPIme (MAXIPIME) 1 g in dextrose 5 % 50 mL IVPB  1 g Intravenous Q12H Christine E Shade, RPH   1 g at 12/01/15 1211  . cholecalciferol (  VITAMIN D) tablet 1,000 Units  1,000 Units Oral Daily Lorretta HarpXilin Niu, MD   1,000 Units at 11/29/15 1045  . dextromethorphan-guaiFENesin (MUCINEX DM) 30-600 MG per 12 hr tablet 1 tablet  1 tablet Oral BID Lorretta HarpXilin Niu, MD   1 tablet at 11/29/15 1045  . diazepam (VALIUM) tablet 2 mg  2 mg Oral Q8H PRN Lorretta HarpXilin Niu, MD   2 mg at 11/28/15 1341  . famotidine (PEPCID) IVPB 20 mg premix  20 mg Intravenous Q12H Lorretta HarpXilin Niu, MD 100 mL/hr at 12/01/15 1210 20 mg at 12/01/15 1210  . heparin injection 5,000 Units  5,000 Units Subcutaneous 3 times per day Lorretta HarpXilin Niu, MD   5,000 Units at 12/01/15 0509  . levalbuterol (XOPENEX) nebulizer solution 0.63 mg  0.63 mg Nebulization Q4H PRN  Nishant Dhungel, MD      . levalbuterol (XOPENEX) nebulizer solution 1.25 mg  1.25 mg Nebulization 4 times per day Nishant Dhungel, MD   1.25 mg at 12/01/15 0810  . LORazepam (ATIVAN) injection 1 mg  1 mg Intravenous Q6H PRN Nishant Dhungel, MD   1 mg at 12/01/15 0715  . metoprolol tartrate (LOPRESSOR) tablet 12.5 mg  12.5 mg Oral BID Lorretta HarpXilin Niu, MD   12.5 mg at 11/30/15 1011  . metroNIDAZOLE (FLAGYL) IVPB 500 mg  500 mg Intravenous Q8H Lorretta HarpXilin Niu, MD 100 mL/hr at 12/01/15 0507 500 mg at 12/01/15 0507  . ondansetron (ZOFRAN) injection 4 mg  4 mg Intravenous Q8H PRN Lorretta HarpXilin Niu, MD      . sodium chloride 0.9 % injection 3 mL  3 mL Intravenous Q12H Lorretta HarpXilin Niu, MD   3 mL at 11/30/15 1000  . traMADol (ULTRAM) tablet 50 mg  50 mg Oral Q8H PRN Nishant Dhungel, MD   50 mg at 11/29/15 1809  . TRIPLE ANTIBIOTIC [phone] OINT 1 application  1 application Apply externally BID PRN Lorretta HarpXilin Niu, MD         Discharge Medications: Please see discharge summary for a list of discharge medications.  Relevant Imaging Results:  Relevant Lab Results:   Additional Information SSN [id number]. Enteric precautions.  Jordayn Mink, Dickey GaveJamie Lee, LCSW

## 2015-12-01 NOTE — [place] FL2 (Deleted)
[place] MEDICAID FL2 LEVEL OF CARE SCREENING TOOL     IDENTIFICATION  Patient Name: John Montgomery Birthdate: 1921-06-30 Sex: male Admission Date (Current Location): 11/26/2015  [facility] [place]   Facility and Address:  [facility], [address], [place], [place] [address]      Provider Number: [id number]  Attending Physician Name and Address:  Eddie NorthNishant Dhungel, MD  Relative Name and Phone Number:       Current Level of Care: Hospital Recommended Level of Care: Assisted Living Facility Prior Approval Number:    Date Approved/Denied:   PASRR Number:    Discharge Plan: Domiciliary (Rest home) (ALF)    Current Diagnoses: Patient Active Problem List   Diagnosis Date Noted  . Nausea vomiting and diarrhea 11/27/2015  . Acute respiratory failure with hypoxia (HCC) 11/27/2015  . Cough 11/27/2015  . Acute-on-chronic kidney injury (HCC) 11/27/2015  . Sepsis (HCC) 11/27/2015  . Acute encephalopathy 11/27/2015  . Right flaccid hemiparesis (HCC) 10/31/2013  . Fx mult cervical vert-closed (HCC) 04/10/2013  . PAF (paroxysmal atrial fibrillation) (HCC) 10/19/2012  . First degree AV block 10/19/2012  . SDH (subdural hematoma) (HCC) 10/19/2012  . HTN (hypertension) 10/19/2012  . CAD, RCA PTCA '98, RCA DES '06. Low risk Nuc 2011 10/19/2012  . Dyslipidemia 10/19/2012  . PVD, moderate RICA disease 10/19/2012  . Diastolic dysfunction, grade 1 with an EF of 50-55% 2011 10/19/2012    Orientation RESPIRATION BLADDER Height & Weight    Self  O2 (4L) Continent   134 lbs.  BEHAVIORAL SYMPTOMS/MOOD NEUROLOGICAL BOWEL NUTRITION STATUS      Continent Diet  AMBULATORY STATUS COMMUNICATION OF NEEDS Skin   Extensive Assist Verbally Normal                       Personal Care Assistance Level of Assistance  Bathing, Feeding, Dressing Bathing Assistance: Limited assistance Feeding assistance: Limited assistance Dressing Assistance:  Limited assistance     Functional Limitations Info             SPECIAL CARE FACTORS FREQUENCY  PT (By licensed PT), OT (By licensed OT), Speech therapy                    Contractures      Additional Factors Info  Allergies Code Status Info: Full Code Allergies Info: Penicillins           Current Medications (12/01/2015):  This is the current hospital active medication list Current Facility-Administered Medications  Medication Dose Route Frequency Provider Last Rate Last Dose  . acetaminophen (TYLENOL) tablet 650 mg  650 mg Oral Q6H PRN Lorretta HarpXilin Niu, MD   650 mg at 11/27/15 0423   Or  . acetaminophen (TYLENOL) suppository 650 mg  650 mg Rectal Q6H PRN Lorretta HarpXilin Niu, MD      . atorvastatin (LIPITOR) tablet 20 mg  20 mg Oral QHS Lorretta HarpXilin Niu, MD   20 mg at 11/28/15 2117  . baclofen (LIORESAL) tablet 5 mg  5 mg Oral BID PRN Lorretta HarpXilin Niu, MD   5 mg at 11/28/15 1227  . beta carotene w/minerals (OCUVITE) tablet 1 tablet  1 tablet Oral Daily Lorretta HarpXilin Niu, MD   1 tablet at 11/29/15 1045  . ceFEPIme (MAXIPIME) 1 g in dextrose 5 % 50 mL IVPB  1 g Intravenous Q12H Christine E Shade, RPH   1 g at 12/01/15 1211  . cholecalciferol (VITAMIN D) tablet 1,000  Units  1,000 Units Oral Daily Lorretta Harp, MD   1,000 Units at 11/29/15 1045  . dextromethorphan-guaiFENesin (MUCINEX DM) 30-600 MG per 12 hr tablet 1 tablet  1 tablet Oral BID Lorretta Harp, MD   1 tablet at 11/29/15 1045  . diazepam (VALIUM) tablet 2 mg  2 mg Oral Q8H PRN Lorretta Harp, MD   2 mg at 11/28/15 1341  . famotidine (PEPCID) IVPB 20 mg premix  20 mg Intravenous Q12H Lorretta Harp, MD 100 mL/hr at 12/01/15 1210 20 mg at 12/01/15 1210  . heparin injection 5,000 Units  5,000 Units Subcutaneous 3 times per day Lorretta Harp, MD   5,000 Units at 12/01/15 0509  . levalbuterol (XOPENEX) nebulizer solution 0.63 mg  0.63 mg Nebulization Q4H PRN Nishant Dhungel, MD      . levalbuterol (XOPENEX) nebulizer solution 1.25 mg  1.25 mg Nebulization 4 times per day  Nishant Dhungel, MD   1.25 mg at 12/01/15 0810  . LORazepam (ATIVAN) injection 1 mg  1 mg Intravenous Q6H PRN Nishant Dhungel, MD   1 mg at 12/01/15 0715  . metoprolol tartrate (LOPRESSOR) tablet 12.5 mg  12.5 mg Oral BID Lorretta Harp, MD   12.5 mg at 11/30/15 1011  . metroNIDAZOLE (FLAGYL) IVPB 500 mg  500 mg Intravenous Q8H Lorretta Harp, MD 100 mL/hr at 12/01/15 0507 500 mg at 12/01/15 0507  . ondansetron (ZOFRAN) injection 4 mg  4 mg Intravenous Q8H PRN Lorretta Harp, MD      . sodium chloride 0.9 % injection 3 mL  3 mL Intravenous Q12H Lorretta Harp, MD   3 mL at 11/30/15 1000  . traMADol (ULTRAM) tablet 50 mg  50 mg Oral Q8H PRN Nishant Dhungel, MD   50 mg at 11/29/15 1809  . TRIPLE ANTIBIOTIC [phone] OINT 1 application  1 application Apply externally BID PRN Lorretta Harp, MD         Discharge Medications: Please see discharge summary for a list of discharge medications.  Relevant Imaging Results:  Relevant Lab Results:   Additional Information SSN [id number]  Saralyn Willison, Dickey Gave, LCSW

## 2015-12-01 NOTE — Progress Notes (Signed)
Pharmacy Antibiotic Follow-up Note  John RoupJulius B Montgomery is a 82 y.o. year-old male admitted on 11/26/2015.  The patient is currently on day 5 total abxs and day 4 of Cefepime for gastroenteritis and pneumonia with Flagyl per Md.  Assessment/Plan: This patient's current antibiotics will be continued without adjustments.  Temp (24hrs), Avg:98.4 F (36.9 C), Min:98.2 F (36.8 C), Max:98.6 F (37 C)   Recent Labs Lab 11/26/15 2225 11/27/15 0446 11/28/15 0445 11/29/15 0850  WBC 14.7* 13.4* 16.6* 13.1*    Recent Labs Lab 11/26/15 2225 11/27/15 0446 11/28/15 0445  CREATININE 1.44* 1.28* 1.11   Estimated Creatinine Clearance: 36.7 mL/min (by C-G formula based on Cr of 1.11).    Allergies  Allergen Reactions  . Penicillins     hives   Antimicrobials this admission: 1/12 >> metronidazole >> 1/12 >> cipro >> 1/13 1/13 >> cefepime >>  Levels/dose changes this admission: None  Microbiology Results: 1/12 BCx: ngtd 1/12 GI Panel: sent  1/12 MRSA PCR: negative 1/12 Cdiff: antigen/toxin negative 1/12 influenza neg   Hessie KnowsJustin M Levelle Edelen, PharmD, BCPS Pager [phone] 12/01/2015 12:05 PM

## 2015-12-01 NOTE — Care Management Important Message (Signed)
Important Message  Patient Details IM Letter given to Suzanne/Case Manager to present to Patient Name: John Montgomery MRN: [id number] Date of Birth: 1920/11/19   Medicare Important Message Given:  Yes    Haskell FlirtJamison, Shandiin Eisenbeis 12/01/2015, 11:24 AMImportant Message  Patient Details  Name: John RoupJulius B Montgomery MRN: [id number] Date of Birth: 1920/11/19   Medicare Important Message Given:  Yes    Haskell FlirtJamison, Adie Vilar 12/01/2015, 11:24 AM

## 2015-12-02 DIAGNOSIS — I480 Paroxysmal atrial fibrillation: Secondary | ICD-10-CM

## 2015-12-02 LAB — CULTURE, BLOOD (ROUTINE X 2)
CULTURE: NO GROWTH
Culture: NO GROWTH

## 2015-12-02 LAB — GLUCOSE, CAPILLARY: GLUCOSE-CAPILLARY: 93 mg/dL (ref 65–99)

## 2015-12-02 LAB — CREATININE, SERUM
CREATININE: 0.75 mg/dL (ref 0.61–1.24)
GFR calc non Af Amer: >60 mL/min (ref >60)

## 2015-12-02 MED ORDER — QUETIAPINE FUMARATE 25 MG PO TABS
25.0000 mg | ORAL_TABLET | Freq: Two times a day (BID) | ORAL | Status: DC
  Administered 2015-12-02: 25 mg via ORAL
  Filled 2015-12-02 (×4): qty 1

## 2015-12-02 MED ORDER — LEVALBUTEROL HCL 1.25 MG/0.5ML IN NEBU
1.2500 mg | INHALATION_SOLUTION | Freq: Four times a day (QID) | RESPIRATORY_TRACT | Status: DC
  Administered 2015-12-02 – 2015-12-03 (×2): 1.25 mg via RESPIRATORY_TRACT
  Filled 2015-12-02 (×9): qty 0.5

## 2015-12-02 NOTE — Progress Notes (Addendum)
[facility] PROGRESS NOTE  John Montgomery OZH:[id number] DOB: Jun 22, 1921 DOA: 11/26/2015 PCP: Junious Silk, MD  Brief narrative 82 year old male with history of hypertension, hyperlipidemia, CAD status post stent, bilateral carotid artery stenosis, see daily stage II, history of subdural hematoma, paroxysmal A. fib, diastolic CHF, GERD, anxiety presented to skilled nursing facility presented with acute onset of nausea, vomiting, diarrhea shortness of breath. Patient was also found to be encephalopathic on presentation. Reportedly patient was having nausea with vomiting and diarrhea for the past 3 days and had some shortness of breath. His oxygen saturation decreased to 82% in the ED. Was found to have elevated lactate of 2.05, BNP of 186, leukocytosis with WBC of 14.7 and low-grade fever of 90 and 80 Fahrenheit with acute on chronic kidney injury. Chest x-ray, UA and stool for C. difficile were negative. CT of his abdomen pelvis showed mild gaseous gastric distention with mesenteric edema with multiple small mesenteric lymph nodes. Ultrasound showed moderate bilateral inguinal hernias containing small bowel and colon without inflammation or obstruction. Admitted for further management.  Overnight on 1/12 patient became increasingly confused and agitated. Was tachypnea and hypoxic requiring 8 L Venturi mask. Chest x-ray was done which showed progressive bilateral pulmonary infiltrates prominent throughout the right lung consistent with multifocal pneumonia. Also suggested possible pulmonary edema.  On 1/15 patient again had respiratory distress with increased work of breathing. Was actively wheezing and placed on Venturi mask. Rapid response called. Patient transferred to stepdown unit.   Assessment/Plan: Sepsis with acute encephalopathy Possibly associated with acute viral gastritis/gastroenteritis followed by right lobar pneumonia. Patient became hypoxic in the hospital requiring  Ventimask. Chest x-ray showed multifocal pneumonia on right lung.  -Antibiotic broadened to IV cefepime. Continue Flagyl. Stool for C. difficile and blood cultures negative. -patient having periodic agitation and restlessness requiring when necessary Ativan.  Acute hypoxic respiratory failure -secondary to underlying pneumonia with volume overload. Required step down monitoring on 1/15 for respiratory distress. Receiving IV Lasix as needed  -Antibiotics  broadened to IV cefepime. blood culture negative. Flu PCR negative.  -Currently on 4 L nasal cannula and has bilateral wheezing.   -Discussed at length with patient's daughter regarding his condition and she agrees that he is declining progressively.  Has significant underlying cardiac history and is wheelchair-bound at his assisted-living. I discussed with her that patient may not benefit from further escalation of care and she understands.continue with breathing treatments, oxygen and antibiotics for now. PT recommends skilled nursing facility. I have consulted palliative care to assist with his symptom management and disposition. He will possibly need hospice follow-up at skilled nursing facility.   ?aspiration pneumonia with dysphagia Seen by speech nurse and patient has moderate oral pharyngeal phase dysphagia. He is at high risk for aspiration. Has not tolerated diet today as patient sedated after receiving a dose of Ativan early this morning.   Essential hypertension Continue metoprolol. Amlodipine held due to soft blood pressure   Coronary artery disease History of stent. Continue metoprolol.  Diastolic dysfunction Received maintenance fluids given hypovolemia on presentation. Now getting when necessary Lasix for fluid overload. Continue beta blocker.  Paroxysmal A. fib Heart rate in the low 100s. Continue beta blocker. Not a candidate for anticoagulation.  Acute on chronic kidney disease stage II Secondary to sepsis and  dehydration. Resolved with fluids.  GERD On Pepcid  Diet: Full liquid  DVT prophylaxis  Code Status: DO NOT RESUSCITATE.  Family Communication: Sister in Social worker at bedside Disposition Plan: Eventually needs skilled  nursing facility. Palliative care consult pending to assist with symptom management. Patient actively wheezy and requiring inpatient care.   Consultants:  None  Procedures:  Head CT  CT abdomen and pelvis  Antibiotics:  Cipro and Flagyl 1/12--  IV cefepime 1/13--  HPI/Subjective: Seen and examined. Patient sedated after receiving Ativan early this morning. Wheezy on exam. Maintaining sats on 4 L via nasal cannula.  Objective: Filed Vitals:   12/01/15 2231 12/02/15 0426  BP: 132/54 149/69  Pulse:  110  Temp:  98.6 F (37 C)  Resp:  20    Intake/Output Summary (Last 24 hours) at 12/02/15 1504 Last data filed at 12/02/15 1100  Gross per 24 hour  Intake    790 ml  Output    425 ml  Net    365 ml   Filed Weights   11/30/15 0558 11/30/15 0900 12/01/15 1440  Weight: 62.6 kg (138 lb 0.1 oz) 61.1 kg (134 lb 11.2 oz) 62.8 kg (138 lb 7.2 oz)    Exam:   General:  Elderly male in bed somnolent  HEENT: Moist mucosa  Chest: Diffuse wheezing bilaterally   Cardiovascular: S1 and S2 irregularly irregular no murmurs rub or gallop  GI: Soft, nondistended, nontender, bowel sounds present  Musculoskeletal: Warm, no edema   CNS: Somnolent  Data Reviewed: Basic Metabolic Panel:  Recent Labs Lab 11/26/15 2225 11/27/15 0446 11/28/15 0445 12/02/15 0421  NA 139 139 136  --   K 3.9 3.7 4.0  --   CL 107 110 111  --   CO2 23 19* 20*  --   GLUCOSE 118* 122* 96  --   BUN 25* 25* 23*  --   CREATININE 1.44* 1.28* 1.11 0.75  CALCIUM 8.9 8.3* 8.1*  --    Liver Function Tests:  Recent Labs Lab 11/26/15 2225 11/27/15 0446  AST 28 42*  ALT 18 19  ALKPHOS 65 49  BILITOT 1.0 0.9  PROT 6.8 5.7*  ALBUMIN 4.2 3.3*    Recent Labs Lab 11/26/15 2225   LIPASE 29   No results for input(s): AMMONIA in the last 168 hours. CBC:  Recent Labs Lab 11/26/15 2225 11/27/15 0446 11/28/15 0445 11/29/15 0850  WBC 14.7* 13.4* 16.6* 13.1*  NEUTROABS 13.1*  --   --   --   HGB 13.0 11.3* 11.3* 11.4*  HCT 38.8* 33.7* 33.8* 34.0*  MCV 93.0 92.8 93.6 91.9  PLT 178 163 176 164   Cardiac Enzymes: No results for input(s): CKTOTAL, CKMB, CKMBINDEX, TROPONINI in the last 168 hours. BNP (last 3 results)  Recent Labs  11/27/15 0312  BNP 186.0*    ProBNP (last 3 results) No results for input(s): PROBNP in the last 8760 hours.  CBG:  Recent Labs Lab 11/29/15 0743 11/29/15 0822 11/30/15 0745 12/01/15 0839 12/02/15 0942  GLUCAP 66 92 84 79 93    Recent Results (from the past 240 hour(s))  Culture, blood (x 2)     Status: None   Collection Time: 11/26/15 10:25 PM  Result Value Ref Range Status   Specimen Description BLOOD LEFT ANTECUBITAL  Final   Special Requests BOTTLES DRAWN AEROBIC AND ANAEROBIC  Final   Culture   Final    NO GROWTH 5 DAYS Performed at [facility]    Report Status 12/02/2015 FINAL  Final  C difficile quick scan w PCR reflex     Status: None   Collection Time: 11/27/15 12:07 AM  Result Value Ref Range Status  C Diff antigen NEGATIVE NEGATIVE Final   C Diff toxin NEGATIVE NEGATIVE Final   C Diff interpretation Negative for toxigenic C. difficile  Final  Culture, blood (x 2)     Status: None   Collection Time: 11/27/15  4:30 AM  Result Value Ref Range Status   Specimen Description BLOOD LEFT WRIST  Final   Special Requests BOTTLES DRAWN AEROBIC AND ANAEROBIC  Final   Culture   Final    NO GROWTH 5 DAYS Performed at [facility]    Report Status 12/02/2015 FINAL  Final  MRSA PCR Screening     Status: None   Collection Time: 11/27/15 10:41 PM  Result Value Ref Range Status   MRSA by PCR NEGATIVE NEGATIVE Final    Comment:        The GeneXpert MRSA Assay (FDA approved for NASAL  specimens only), is one component of a comprehensive MRSA colonization surveillance program. It is not intended to diagnose MRSA infection nor to guide or monitor treatment for MRSA infections.      Studies: No results found.  Scheduled Meds: . atorvastatin  20 mg Oral QHS  . beta carotene w/minerals  1 tablet Oral Daily  . ceFEPime (MAXIPIME) IV  1 g Intravenous Q12H  . cholecalciferol  1,000 Units Oral Daily  . dextromethorphan-guaiFENesin  1 tablet Oral BID  . famotidine (PEPCID) IV  20 mg Intravenous Q12H  . heparin  5,000 Units Subcutaneous 3 times per day  . levalbuterol  1.25 mg Nebulization Q6H  . metoprolol tartrate  12.5 mg Oral BID  . metronidazole  500 mg Intravenous Q8H  . QUEtiapine  25 mg Oral BID  . sodium chloride  3 mL Intravenous Q12H   Continuous Infusions:      Time spent: 25 minutes    John Montgomery  [facility] Pager [phone] If 7PM-7AM, please contact night-coverage at [email or web address], password [facility] 12/02/2015, 3:04 PM  LOS: 5 days

## 2015-12-02 NOTE — Progress Notes (Signed)
SLP Cancellation Note  Patient Details Name: John Montgomery MRN: [id number] DOB: 1921-09-02   Cancelled treatment:       Reason Eval/Treat Not Completed: Other (comment) (pt currently sleepy but fidgety, RN reports pt had received ativan to help relax him due to agitation, advised Darel Hong *sister in law* to not feed pt if not fully alert and tolerating as he had significant dysphagia prior to most recent events - which is likely much exacerbated currently)   Donavan Burnet, MS [facility] SLP [phone]

## 2015-12-02 NOTE — Progress Notes (Signed)
Patient became very agitated and aggressive.  Trying to climb out of bed, kicking and hitting staff.  Patient yelling out incomprehensible words.  Staff tried to console patient providing support and reorientation with little effect.  Patient removed condom cath and attempted to remove IV cath.  Ativan given with good effect

## 2015-12-03 DIAGNOSIS — R0600 Dyspnea, unspecified: Secondary | ICD-10-CM | POA: Insufficient documentation

## 2015-12-03 DIAGNOSIS — R0602 Shortness of breath: Secondary | ICD-10-CM | POA: Insufficient documentation

## 2015-12-03 DIAGNOSIS — Z66 Do not resuscitate: Secondary | ICD-10-CM | POA: Insufficient documentation

## 2015-12-03 DIAGNOSIS — Z515 Encounter for palliative care: Secondary | ICD-10-CM

## 2015-12-03 LAB — GLUCOSE, CAPILLARY: GLUCOSE-CAPILLARY: 85 mg/dL (ref 65–99)

## 2015-12-03 MED ORDER — QUETIAPINE FUMARATE 25 MG PO TABS: 25.0000 mg | ORAL_TABLET | Freq: Two times a day (BID) | ORAL | Status: AC

## 2015-12-03 MED ORDER — MORPHINE SULFATE (PF) 2 MG/ML IV SOLN
2.0000 mg | INTRAVENOUS | Status: DC | PRN
  Administered 2015-12-03: 2 mg via INTRAVENOUS
  Filled 2015-12-03: qty 1

## 2015-12-03 MED ORDER — SODIUM CHLORIDE 0.9 % IV SOLN
1.0000 mg/h | INTRAVENOUS | Status: DC
  Administered 2015-12-03: 1 mg/h via INTRAVENOUS
  Filled 2015-12-03: qty 10

## 2015-12-03 MED ORDER — MORPHINE BOLUS VIA INFUSION
2.0000 mg | INTRAVENOUS | Status: DC | PRN
  Filled 2015-12-03: qty 2

## 2015-12-03 MED ORDER — LEVALBUTEROL HCL 0.63 MG/3ML IN NEBU: 0.6300 mg | INHALATION_SOLUTION | RESPIRATORY_TRACT | Status: AC | PRN

## 2015-12-03 MED ORDER — LORAZEPAM 2 MG/ML IJ SOLN
1.0000 mg | INTRAMUSCULAR | Status: DC | PRN
  Administered 2015-12-03 (×2): 1 mg via INTRAVENOUS
  Filled 2015-12-03 (×2): qty 1

## 2015-12-03 MED ORDER — LORAZEPAM 2 MG/ML IJ SOLN: 1.0000 mg | INTRAMUSCULAR | Status: DC | PRN

## 2015-12-03 MED ORDER — METOPROLOL TARTRATE 1 MG/ML IV SOLN
5.0000 mg | Freq: Once | INTRAVENOUS | Status: AC
  Administered 2015-12-03: 5 mg via INTRAVENOUS
  Filled 2015-12-03: qty 5

## 2015-12-03 MED ORDER — DM-GUAIFENESIN ER 30-600 MG PO TB12: 1.0000 | ORAL_TABLET | Freq: Two times a day (BID) | ORAL | Status: AC

## 2015-12-03 NOTE — Progress Notes (Signed)
[facility].  Pt discharged with PIV in right AC.  All belongings sent with family. All questions asked and answered.  Sundra Aland, RN

## 2015-12-03 NOTE — Progress Notes (Signed)
HEART RATE READING ON MONITOR: 112 to 120. IV lopressor 5 mg given.

## 2015-12-03 NOTE — Progress Notes (Signed)
CSW assisting with d/c planning. Palliative care Team met with pt/family this am and have recommended residential hospice home placement. Family is in agreement with this plan and have requested [facility]. Nunzio Cory, nurse liaision from hospice, met with family this afternoon and is able to offer hospice home placement today. MD notified and has provided dc summary. D/C Summary included in packet. Family is in agreement with d/c to [facility] today. [facility] transport is required. Medical necessity form completed. Number for report provided to pt's nurse.  Werner Lean LCSW [phone]

## 2015-12-03 NOTE — Progress Notes (Signed)
SLP Cancellation Note  Patient Details Name: SLOAN TAKAGI MRN: [id number] DOB: 11-15-1921   Cancelled treatment:        Orders discontinued, please reorder if desire.    Chales Abrahams 12/03/2015, 9:37 AM

## 2015-12-03 NOTE — Consult Note (Signed)
Consultation Note Date: 12/03/2015   Patient Name: John Montgomery  DOB: 24-Apr-1921  MRN: [id number]  Age / Sex: 82 y.o., male  PCP: Lorne Skeens, MD Referring Physician: Theodis Blaze, MD  Reason for Consultation: Disposition, Establishing goals of care, Non pain symptom management and Pain control    Clinical Assessment/Narrative: 82 yo male with hx of ICH with residual right sided hemiparesis/wheel chair bound, MI, DHF, CKD stage II, and P- Afib (not on anticoagulation) who presented to [facility] on 11/26/2015 with sepsis, vomiting and diarrhea.  He subsequently developed multifocal pneumonia and delirium.  Speech therapy indicated he is a high risk for aspiration.    I met with daughter John Montgomery and son John Montgomery today.  They want their father to be comfortable.  They acknowledge that at this point his is in some pain and working to breathe.  They relate to me that he has not eaten in 8 days and they do not believe he will improve.  John Montgomery mentions that his mother in law passed at [place] place last year and it was a very nice place.  The patient is unable to return to ALF and family would like to move him to residential hospice if eligible.  Contacts/Participants in Discussion: Son, John Montgomery & Daughter John Montgomery.  Grandson arrived late in the visit. Primary Decision Maker: both children Relationship to Patient Children HCPOA: No  SUMMARY OF RECOMMENDATIONS  Disposition:  Recommending residential hospice.  A referral has been made with social work Pain/Dyspnea:  Will start low dose morphine IV - 1 mg/ hour with PRN boluses for pain and dyspnea Palliative Medicine will follow up tomorrow (1/19) to ensure patient is comfortable.  Code Status/Advance Care Planning: DNR    Code Status Orders        Start     Ordered   11/30/15 0840  Do not attempt resuscitation (DNR)   Continuous    Question Answer Comment  In the event  of cardiac or respiratory ARREST Do not call a "code blue"   In the event of cardiac or respiratory ARREST Do not perform Intubation, CPR, defibrillation or ACLS   In the event of cardiac or respiratory ARREST Use medication by any route, position, wound care, and other measures to relive pain and suffering. May use oxygen, suction and manual treatment of airway obstruction as needed for comfort.      11/30/15 0839    Code Status History    Date Active Date Inactive Code Status Order ID Comments User Context   11/27/2015  4:09 AM 11/30/2015  8:39 AM Full Code [id number]  Ivor Costa, MD ED      Other Directives:None  Per family the patient would not want his life artificially prolonged if there was no chance of recovery.  Symptom Management:   As above under recommendations.  Palliative Prophylaxis:   Aspiration, Bowel Regimen, Delirium Protocol, Frequent Pain Assessment and Turn Reposition  Additional Recommendations (Limitations, Scope, Preferences):  Full Comfort Care  Will discontinue antibiotics and oral medications that will not benefit him at this point.  To simplify benzodiazepines I will d/c valium and continue ativan for agitation as well as muscle spasms.   Will continue Seroquel for now but likely d/c it depending on how he's doing 1/19.  Psycho-social/Spiritual:  Support System: Adequate Desire for further Chaplaincy support: yes Additional Recommendations: Caregiving  Support/Resources  Prognosis: < 2 weeks.  This gentleman is bedbound, unable to sip water thru a straw, no PO intake.  This  is secondary to multifocal pna in the setting of previous ICH with right sided weakness and CAD.   Most likely residential hospice eligible.  Discharge Planning: Hospice facility   Chief Complaint/ Primary Diagnoses: Present on Admission:  . Nausea vomiting and diarrhea . Acute respiratory failure with hypoxia ([place]) . SDH (subdural hematoma) ([place]) . PAF (paroxysmal atrial  fibrillation) ([place]) . Diastolic dysfunction, grade 1 with an EF of 50-55% 2011 . PVD, moderate RICA disease . Dyslipidemia . CAD, RCA PTCA '98, RCA DES '06. Low risk Nuc 2011 . HTN (hypertension) . Cough . Acute-on-chronic kidney injury ([place]) . Sepsis ([place])  I have reviewed the medical record, interviewed the patient and family, and examined the patient. The following aspects are pertinent.  Past Medical History  Diagnosis Date  . Hypertension   . Hyperlipidemia   . Myocardial infarction ([place])   . Stroke [facility]) 2013  . CAD (coronary artery disease)     stents placed by Dr Melvern Banker  . History of stress test 08/2010    Low risk study  . Carotid disease, bilateral ([place])     revealing moderate right and mild left ICA stenosis.  Marland Kitchen Hx of echocardiogram 12/2009    Showed good LV function  . CKD (chronic kidney disease), stage II   . Chronic diastolic (congestive) heart failure [facility])    Social History   Social History  . Marital Status: Widowed    Spouse Name: N/A  . Number of Children: N/A  . Years of Education: N/A   Social History Main Topics  . Smoking status: Never Smoker   . Smokeless tobacco: Never Used  . Alcohol Use: No  . Drug Use: No  . Sexual Activity: No   Other Topics Concern  . None   Social History Narrative   Family History  Problem Relation Age of Onset  . Kidney disease Mother    Scheduled Meds: . atorvastatin  20 mg Oral QHS  . beta carotene w/minerals  1 tablet Oral Daily  . ceFEPime (MAXIPIME) IV  1 g Intravenous Q12H  . cholecalciferol  1,000 Units Oral Daily  . dextromethorphan-guaiFENesin  1 tablet Oral BID  . famotidine (PEPCID) IV  20 mg Intravenous Q12H  . heparin  5,000 Units Subcutaneous 3 times per day  . levalbuterol  1.25 mg Nebulization Q6H  . metoprolol tartrate  12.5 mg Oral BID  . metronidazole  500 mg Intravenous Q8H  . QUEtiapine  25 mg Oral BID  . sodium chloride  3 mL Intravenous Q12H   Continuous Infusions:  PRN  Meds:.acetaminophen **OR** acetaminophen, diazepam, levalbuterol, LORazepam, ondansetron, TRIPLE ANTIBIOTIC Medications Prior to Admission:  Prior to Admission medications   Medication Sig Start Date End Date Taking? Authorizing Provider  amLODipine (NORVASC) 5 MG tablet Take 5 mg by mouth daily.   Yes Historical Provider, MD  atorvastatin (LIPITOR) 20 MG tablet Take 20 mg by mouth at bedtime.    Yes Historical Provider, MD  baclofen (LIORESAL) 10 MG tablet Take 5 mg by mouth 2 (two) times daily as needed for muscle spasms.    Yes Historical Provider, MD  beta carotene w/minerals (OCUVITE) tablet Take 1 tablet by mouth daily.   Yes Historical Provider, MD  Cholecalciferol 2000 UNITS TABS Take 1 tablet by mouth daily.   Yes Historical Provider, MD  desonide (DESOWEN) 0.05 % cream Apply 1 application topically 2 (two) times daily as needed (dry skin).  03/21/14  Yes Historical Provider, MD  diazepam (VALIUM) 2 MG  tablet Take 1 tablet (2 mg total) by mouth every 8 (eight) hours as needed for muscle spasms. 07/01/15  Yes Virgel Manifold, MD  fluticasone (CUTIVATE) 0.05 % cream Apply 1 application topically 2 (two) times daily as needed (dry skin).  03/21/14  Yes Historical Provider, MD  metoprolol tartrate (LOPRESSOR) 25 MG tablet Take 12.5 mg by mouth 2 (two) times daily.    Yes Historical Provider, MD  Neomycin-Bacitracin-Polymyxin (TRIPLE ANTIBIOTIC EX) Apply 1 application topically 2 (two) times daily as needed (skin tear).   Yes Historical Provider, MD  pantoprazole (PROTONIX) 40 MG tablet Take 40 mg by mouth at bedtime.    Yes Historical Provider, MD  polyethylene glycol (MIRALAX / GLYCOLAX) packet Take 17 g by mouth daily as needed for mild constipation or moderate constipation.   Yes Historical Provider, MD  sodium fluoride (PREVIDENT 5000 PLUS) 1.1 % CREA dental cream Place 1 application onto teeth daily.   Yes Historical Provider, MD  traMADol (ULTRAM) 50 MG tablet Take 1 tablet (50 mg total) by  mouth 3 (three) times daily as needed. 07/01/15  Yes Virgel Manifold, MD   Allergies  Allergen Reactions  . Penicillins     hives    Review of Systems:   Unable to obtain as patient is delirious   Physical Exam   Frail, elderly, cachetic appearing male, mumbles but is unable to communicate. CV:  Tachycardic, no m/r/g Resp:  Increased work of breathing, especially when attempting to speak.  +wheeze Abdomen:  Thin, nt, nd Extremities:  No edema.  Unable to follow commands to move extremities.  Vital Signs: BP 142/60 mmHg  Pulse 101  Temp(Src) 98.2 F (36.8 C) (Axillary)  Resp 20  Ht _0  (1.702 m)  Wt 63.6 kg (140 lb 3.4 oz)  BMI 21.96 kg/m2  SpO2 94%  SpO2: SpO2: 94 % O2 Device:SpO2: 94 % O2 Flow Rate: .O2 Flow Rate (L/min): 4 L/min  IO: Intake/output summary:  Intake/Output Summary (Last 24 hours) at 12/03/15 0900 Last data filed at 12/03/15 0601  Gross per 24 hour  Intake    675 ml  Output   1275 ml  Net   -600 ml    LBM: Last BM Date: 12/03/15 Baseline Weight: Weight: 58.8 kg (129 lb 10.1 oz) Most recent weight: Weight: 63.6 kg (140 lb 3.4 oz)      Palliative Assessment/Data:  Flowsheet Rows        Most Recent Value   Intake Tab    Referral Department  Hospitalist   Unit at Time of Referral  Cardiac/Telemetry Unit   Palliative Care Primary Diagnosis  Neurology   Date Notified  12/02/15   Palliative Care Type  New Palliative care   Reason for referral  Pain, Non-pain Symptom, Clarify Goals of Care   Date of Admission  11/26/15   Date first seen by Palliative Care  12/03/15   # of days IP prior to Palliative referral  6   Clinical Assessment    Palliative Performance Scale Score  20%   Pain Max last 24 hours  3   Pain Min Last 24 hours  1   Dyspnea Max Last 24 Hours  8   Dyspnea Min Last 24 hours  4   Psychosocial & Spiritual Assessment    Palliative Care Outcomes    Patient/Family meeting held?  Yes      Additional Data Reviewed:  CBC:      Component Value Date/Time   WBC 13.1* 11/29/2015 0850  HGB 11.4* 11/29/2015 0850   HCT 34.0* 11/29/2015 0850   PLT 164 11/29/2015 0850   MCV 91.9 11/29/2015 0850   NEUTROABS 13.1* 11/26/2015 2225   LYMPHSABS 0.7 11/26/2015 2225   MONOABS 0.9 11/26/2015 2225   EOSABS 0.0 11/26/2015 2225   BASOSABS 0.0 11/26/2015 2225   Comprehensive Metabolic Panel:    Component Value Date/Time   NA 136 11/28/2015 0445   K 4.0 11/28/2015 0445   CL 111 11/28/2015 0445   CO2 20* 11/28/2015 0445   BUN 23* 11/28/2015 0445   CREATININE 0.75 12/02/2015 0421   GLUCOSE 96 11/28/2015 0445   CALCIUM 8.1* 11/28/2015 0445   AST 42* 11/27/2015 0446   ALT 19 11/27/2015 0446   ALKPHOS 49 11/27/2015 0446   BILITOT 0.9 11/27/2015 0446   PROT 5.7* 11/27/2015 0446   ALBUMIN 3.3* 11/27/2015 0446     Time In: 9:00 Time Out: 10:10 Time Total: 70 min Greater than 50%  of this time was spent counseling and coordinating care related to the above assessment and plan.  Signed by:  Melton Alar, PA-C  12/03/2015, 9:00 AM  Please contact Palliative Medicine Team phone at [phone] for questions and concerns.

## 2015-12-03 NOTE — Discharge Summary (Addendum)
Physician Discharge Summary  John Montgomery ZOX:[id number] DOB: Aug 25, 1921 DOA: 11/26/2015  PCP: Junious Silk, MD  Admit date: 11/26/2015 Discharge date: 12/03/2015  Recommendations for Outpatient Follow-up:  Pt will be discharged to residential hospice home at [facility]   Discharge Diagnoses:  Principal Problem:   Nausea vomiting and diarrhea Active Problems:   PAF (paroxysmal atrial fibrillation) (HCC)   SDH (subdural hematoma) (HCC)   HTN (hypertension)   CAD, RCA PTCA '98, RCA DES '06. Low risk Nuc 2011   Dyslipidemia   PVD, moderate RICA disease   Diastolic dysfunction, grade 1 with an EF of 50-55% 2011   Acute respiratory failure with hypoxia (HCC)   Cough   Acute-on-chronic kidney injury (HCC)   Sepsis (HCC)   Acute encephalopathy   Palliative care encounter   Dyspnea   SOB (shortness of breath)   DNR (do not resuscitate)   Discharge Condition: Stable  Diet recommendation: Comfort feeding  History of present illness: 82 year old male with history of hypertension, hyperlipidemia, CAD status post stent, bilateral carotid artery stenosis, see daily stage II, history of subdural hematoma, paroxysmal A. fib, diastolic CHF, GERD, anxiety presented to skilled nursing facility presented with acute onset of nausea, vomiting, diarrhea shortness of breath. Patient was also found to be encephalopathic on presentation. Reportedly patient was having nausea with vomiting and diarrhea several days in duration. His oxygen saturation decreased to 82% in the ED. Was found to have elevated lactate of 2.05, BNP of 186, leukocytosis with WBC of 14.7 and low-grade fever of 90 Fahrenheit with acute on chronic kidney injury. Chest x-ray, UA and stool for C. difficile were negative. CT of his abdomen pelvis showed mild gaseous gastric distention with mesenteric edema with multiple small mesenteric lymph nodes. Ultrasound showed moderate bilateral inguinal hernias containing small bowel and  colon without inflammation or obstruction.  On 1/15 patient again had respiratory distress with increased work of breathing. Was actively wheezing and placed on Venturi mask. Rapid response called. Patient transferred to stepdown unit. Patient continued to clinically deteriorate, now full comfort with plan to discharge to residential hospice.  Assessment/Plan: Sepsis with acute encephalopathy - Possibly associated with acute viral gastritis/gastroenteritis followed by right lobar pneumonia.  - Patient became hypoxic in the hospital requiring Ventimask. Chest x-ray showed multifocal pneumonia on right lung.  - Antibiotic broadened to IV cefepime. Continued to clinically deteriorate, now full comfort.   Acute hypoxic respiratory failure - secondary to underlying pneumonia with volume overload. Required step down monitoring on 1/15 for respiratory distress.  - was receiving IV Lasix as needed  - full comfort   ?aspiration pneumonia with dysphagia - Seen by speech nurse and patient has moderate oral pharyngeal phase dysphagia.  - He is at high risk for aspiration. - comfort feeding allowed   Essential hypertension - has not been able to take PO   Coronary artery disease - History of stent. Continue metoprolol if pt able to take PO   Diastolic dysfunction - comfort care   Paroxysmal A. fib - Not a candidate for anticoagulation.  Acute on chronic kidney disease stage II - Secondary to sepsis and dehydration. Resolved with fluids.  Code Status: DO NOT RESUSCITATE.  Family Communication: Sister in Social worker at bedside Disposition Plan: Residential hospice    Procedures/Studies: Ct Head Wo Contrast  11/27/2015  CLINICAL DATA:  Altered mental status EXAM: CT HEAD WITHOUT CONTRAST TECHNIQUE: Contiguous axial images were obtained from the base of the skull through the vertex without intravenous contrast.  COMPARISON:  02/28/2013 FINDINGS: Global atrophy. Chronic ischemic changes in the  periventricular white matter. No mass effect, midline shift, or acute hemorrhage. Tiny lacunar infarct in the right caudate head is stable. The cranium is intact. IMPRESSION: No acute intracranial pathology. Electronically Signed   By: Jolaine Click M.D.   On: 11/27/2015 07:34   Ct Abdomen Pelvis W Contrast  11/27/2015  CLINICAL DATA:  82 year old male with vomiting and diarrhea. EXAM: CT ABDOMEN AND PELVIS WITH CONTRAST TECHNIQUE: Multidetector CT imaging of the abdomen and pelvis was performed using the standard protocol following bolus administration of intravenous contrast. CONTRAST:  80mL OMNIPAQUE IOHEXOL 300 MG/ML  SOLN COMPARISON:  None. FINDINGS: Lower chest: Right greater than left basilar atelectasis. Coronary artery calcifications are seen. The heart is normal in size. Liver: 1.5 cm cyst in the left lobe adjacent to the left portal vein. No additional focal lesion. Hepatobiliary: Gallbladder physiologically distended, no calcified stone. No biliary dilatation. Pancreas: No ductal dilatation or inflammation. Spleen: Normal. Adrenal glands: No nodule. Kidneys: Symmetric renal enhancement and excretion. No hydronephrosis. There is cortical scarring and atrophy of the upper right kidney. Multiple right renal cysts, largest measuring 4.1 cm. Tiny cortical cyst in the left kidney. Stomach/Bowel: There is gaseous gastric distention. Enteric contrast in the distal esophagus with small hiatal hernia. No bowel obstruction, enteric contrast is reached the colon. There are no dilated or thickened small bowel loops. Bilateral inguinal hernias, containing normal appearing small bowel on the right, and normal appearing sigmoid colon on the left. No bowel inflammation or obstruction secondary to these hernias. Small volume of stool throughout the colon without colonic wall thickening. Portions of the appendix are visualized and are normal. Vascular/Lymphatic: There is mesenteric edema in the central mesentery with  associated small mesenteric lymph nodes. Mesenteric vessels remain patent. No retroperitoneal adenopathy. Abdominal aorta is normal in caliber. Moderate to advanced atherosclerosis of the abdominal aorta and its branches. No aneurysm. Reproductive: Prostate gland is prominent size. Bladder: Bladder physiologically distended, no wall thickening. Other: No free air, free fluid, or intra-abdominal fluid collection. Musculoskeletal: Scoliotic curvature and advanced multilevel degenerative change in the spine. Prominent Schmorl's node involving superior endplate of L1. The bones appear under mineralized. There are no acute or suspicious osseous abnormalities. IMPRESSION: 1. Mild gaseous gastric distention with contrast in the distal esophagus. There is no small bowel dilatation, and findings may reflect gastroenteritis. 2. Mesenteric edema with multiple small mesenteric lymph nodes an appearance suggestive of "misty mesentery ". This is nonspecific finding and can be seen in the setting of edema secondary to renal failure, heart failure, or hypoalbuminemia; inflammation such as sclerosing mesenteritis or less likely neoplastic. 3. Moderate bilateral inguinal hernias, containing small bowel on the right and sigmoid colon on the left, no bowel inflammation or obstruction. 4. Incidental findings of atherosclerosis, focal scarring and atrophy of the upper right kidney with multiple renal cysts, and hepatic cyst. Electronically Signed   By: Rubye Oaks M.D.   On: 11/27/2015 02:32   Dg Chest Port 1 View  11/30/2015  CLINICAL DATA:  Respiratory distress and possible aspiration pneumonia. Cough and congestion. EXAM: PORTABLE CHEST 1 VIEW COMPARISON:  11/28/2015 FINDINGS: Lungs are adequately inflated demonstrate persistent patchy airspace process over the right lung most prominent over the upper lobe with slight interval improvement likely due to pneumonia. Mild prominence of the perihilar markings unchanged. No definite  effusion. Borderline cardiomegaly unchanged. Remainder of the exam is unchanged. IMPRESSION: Mild interval improvement of patchy airspace process  over the right lung likely pneumonia. Cannot exclude a component of mild vascular congestion. Electronically Signed   By: Elberta Fortis M.D.   On: 11/30/2015 10:01   Dg Chest Port 1 View  11/28/2015  CLINICAL DATA:  Shortness of breath. EXAM: PORTABLE CHEST 1 VIEW COMPARISON:  11/27/2015. FINDINGS: Mediastinum hilar structures stable. Stable cardiomegaly. Progressive bilateral pulmonary infiltrates particularly prominent throughout the right lung. Bilateral pneumonia could present this fashion. Pulmonary edema cannot be excluded. No pleural effusion or pneumothorax . IMPRESSION: Progressive bilateral pulmonary infiltrates particularly prominent throughout the right lung consistent with multifocal pneumonia. Congestive heart failure pulmonary edema cannot be excluded. Electronically Signed   By: Maisie Fus  Register   On: 11/28/2015 07:40   Dg Chest Portable 1 View  11/27/2015  CLINICAL DATA:  82 year old male with diarrhea and shortness of breath EXAM: PORTABLE CHEST 1 VIEW COMPARISON:  Radiograph dated 09/21/2014 FINDINGS: Single-view of the chest does not demonstrate a focal consolidation. There is mild diffuse interstitial prominence possibly a degree of congestion or edema. No pleural effusion or pneumothorax. Stable cardiac silhouette. The osseous structures are grossly unremarkable. IMPRESSION: No focal consolidation. Electronically Signed   By: Elgie Collard M.D.   On: 11/27/2015 03:16   Dg Swallowing Func-speech Pathology  11/29/2015  Objective Swallowing Evaluation:   Patient Details Name: TYREL LEX MRN: [id number] Date of Birth: 06-02-1921 Today's Date: 11/29/2015 Time: SLP Start Time (ACUTE ONLY): 1431-SLP Stop Time (ACUTE ONLY): 1520 SLP Time Calculation (min) (ACUTE ONLY): 49 min Past Medical History: @PM[facility] @ Past Surgical History: Past Surgical  History Procedure Laterality Date . Angioplasty  2006   RCA stenting by Dr Elsie Lincoln HPI: 82 yo male adm to [facility] with nausea/vomiting/diarrhea and dyspnea x 3 days prior to admit, ? viral gastroenteritis.  Pt found to have Right lung pna.  Daughter present reports pt with recent issues with vomiting over the last 3 weeks.  Pt also with h/o SDH with right sided weakness.  HE admits to issues with drooling from right side of mouth even when awake over the last few months and coughing some with intake on food and drink.  Swallow eval ordered.   Subjective: pt awake in chair, daughter Diane with pt Assessment / Plan / Recommendation CHL IP CLINICAL IMPRESSIONS 11/29/2015 Therapy Diagnosis Moderate oral phase dysphagia;Moderate pharyngeal phase dysphagia;Mild cervical esophageal phase dysphagia Clinical Impression Pt presents with moderate oropharyngeal and suspected mild cervical esophageal dysphagia with sensorimotor deficits.  Weakness results in decreased oral cohesion and decreased posterior propulsion.  Premature spillage of nectar into pharynx to pyriform sinus noted x 1 via straw.  Pharyngeal swallow was characterized by good laryngeal elevation but poor pharyngeal contraction and tongue base retraction.  Said weakness results in residuals that pt does not consistently sense but is decreased with dry swallows *reflexive and cued.  Residuals were worse with increased viscocity.  Trace laryngeal penetration of thin noted to upper larynx only.  Pt's largest aspiration risk appears to be due to residuals that pt has difficulty clearing. Using live video and verbal feedback, educated pt/daughter and aunt Darel Hong to findings/recommendations to MITIGATE risk. Advised daughter Sedalia Muta to suspicion of aspiration due to weakness and residuals.  Recommend continue full liquids = avoiding pudding/grits due to increased viscocity  Strict aspiration precautions recommeded. SLP to follow up. .   Impact on safety and function Moderate  aspiration risk   CHL IP TREATMENT RECOMMENDATION 11/29/2015 Treatment Recommendations Therapy as outlined in treatment plan below   Prognosis 11/29/2015 Prognosis for  Safe Diet Advancement Fair Barriers to Reach Goals Other (Comment) Barriers/Prognosis Comment -- CHL IP DIET RECOMMENDATION 11/29/2015 SLP Diet Recommendations Other (Comment) Liquid Administration via Straw;Spoon Medication Administration Crushed with puree Compensations Slow rate;Small sips/bites;Multiple dry swallows after each bite/sip;Other (Comment) Postural Changes --   CHL IP OTHER RECOMMENDATIONS 11/29/2015 Recommended Consults -- Oral Care Recommendations Oral care BID Other Recommendations --   CHL IP FOLLOW UP RECOMMENDATIONS 11/29/2015 Follow up Recommendations Home health SLP   CHL IP FREQUENCY AND DURATION 11/29/2015 Speech Therapy Frequency (ACUTE ONLY) min 2x/week Treatment Duration 1 week      CHL IP ORAL PHASE 11/29/2015 Oral Phase Impaired Oral - Pudding Teaspoon -- Oral - Pudding Cup -- Oral - Honey Teaspoon -- Oral - Honey Cup -- Oral - Nectar Teaspoon Reduced posterior propulsion;Weak lingual manipulation Oral - Nectar Cup -- Oral - Nectar Straw Weak lingual manipulation;Reduced posterior propulsion;Premature spillage Oral - Thin Teaspoon Weak lingual manipulation;Reduced posterior propulsion;Decreased bolus cohesion;Premature spillage Oral - Thin Cup -- Oral - Thin Straw Weak lingual manipulation;Reduced posterior propulsion;Decreased bolus cohesion Oral - Puree Weak lingual manipulation;Reduced posterior propulsion;Delayed oral transit Oral - Mech Soft -- Oral - Regular Weak lingual manipulation;Impaired mastication;Reduced posterior propulsion;Delayed oral transit Oral - Multi-Consistency -- Oral - Pill -- Oral Phase - Comment --  CHL IP PHARYNGEAL PHASE 11/29/2015 Pharyngeal Phase Impaired Pharyngeal- Pudding Teaspoon -- Pharyngeal -- Pharyngeal- Pudding Cup -- Pharyngeal -- Pharyngeal- Honey Teaspoon -- Pharyngeal -- Pharyngeal-  Honey Cup -- Pharyngeal -- Pharyngeal- Nectar Teaspoon Reduced pharyngeal peristalsis;Reduced tongue base retraction;Pharyngeal residue - valleculae;Pharyngeal residue - pyriform;Pharyngeal residue - posterior pharnyx Pharyngeal -- Pharyngeal- Nectar Cup -- Pharyngeal -- Pharyngeal- Nectar Straw Reduced pharyngeal peristalsis;Reduced tongue base retraction;Pharyngeal residue - valleculae;Pharyngeal residue - pyriform;Pharyngeal residue - posterior pharnyx Pharyngeal -- Pharyngeal- Thin Teaspoon Reduced pharyngeal peristalsis;Reduced tongue base retraction;Pharyngeal residue - valleculae;Pharyngeal residue - pyriform Pharyngeal -- Pharyngeal- Thin Cup -- Pharyngeal -- Pharyngeal- Thin Straw Reduced pharyngeal peristalsis;Reduced tongue base retraction;Pharyngeal residue - valleculae;Pharyngeal residue - pyriform;Pharyngeal residue - posterior pharnyx Pharyngeal -- Pharyngeal- Puree Reduced pharyngeal peristalsis;Reduced tongue base retraction;Pharyngeal residue - valleculae;Pharyngeal residue - pyriform Pharyngeal -- Pharyngeal- Mechanical Soft -- Pharyngeal -- Pharyngeal- Regular Reduced pharyngeal peristalsis;Reduced tongue base retraction;Pharyngeal residue - valleculae;Pharyngeal residue - pyriform Pharyngeal -- Pharyngeal- Multi-consistency -- Pharyngeal -- Pharyngeal- Pill -- Pharyngeal -- Pharyngeal Comment --  CHL IP CERVICAL ESOPHAGEAL PHASE 11/29/2015 Cervical Esophageal Phase Impaired Pudding Teaspoon -- Pudding Cup -- Honey Teaspoon -- Honey Cup -- Nectar Teaspoon -- Nectar Cup -- Nectar Straw -- Thin Teaspoon -- Thin Cup -- Thin Straw -- Puree -- Mechanical Soft -- Regular -- Multi-consistency -- Pill -- Cervical Esophageal Comment mild amount of residuals at UES without pt awareness, appearance of secretions mixing with barium Donavan Burnet, MS [facility] SLP [phone]               Discharge Exam: Filed Vitals:   12/03/15 0400 12/03/15 0601  BP:  142/60  Pulse: 103 101  Temp:  98.2 F (36.8 C)  Resp:   20   Filed Vitals:   12/03/15 0145 12/03/15 0400 12/03/15 0601 12/03/15 0701  BP:   142/60   Pulse: 115 103 101   Temp:   98.2 F (36.8 C)   TempSrc:   Axillary   Resp:   20   Height:      Weight:    63.6 kg (140 lb 3.4 oz)  SpO2: 96%  94%     General: Pt is NAD, exam postponed in an effort  to ensure comfort   Discharge Instructions  Discharge Instructions    Diet - low sodium heart healthy    Complete by:  As directed      Increase activity slowly    Complete by:  As directed             Medication List    STOP taking these medications        amLODipine 5 MG tablet  Commonly known as:  NORVASC     atorvastatin 20 MG tablet  Commonly known as:  LIPITOR     baclofen 10 MG tablet  Commonly known as:  LIORESAL     beta carotene w/minerals tablet     Cholecalciferol 2000 units Tabs     desonide 0.05 % cream  Commonly known as:  DESOWEN     diazepam 2 MG tablet  Commonly known as:  VALIUM     fluticasone 0.05 % cream  Commonly known as:  CUTIVATE     pantoprazole 40 MG tablet  Commonly known as:  PROTONIX     polyethylene glycol packet  Commonly known as:  MIRALAX / GLYCOLAX     sodium fluoride 1.1 % Crea dental cream  Commonly known as:  PREVIDENT 5000 PLUS     traMADol 50 MG tablet  Commonly known as:  ULTRAM      TAKE these medications        dextromethorphan-guaiFENesin 30-600 MG 12hr tablet  Commonly known as:  MUCINEX DM  Take 1 tablet by mouth 2 (two) times daily.     levalbuterol 0.63 MG/3ML nebulizer solution  Commonly known as:  XOPENEX  Take 3 mLs (0.63 mg total) by nebulization every 4 (four) hours as needed for wheezing or shortness of breath.     metoprolol tartrate 25 MG tablet  Commonly known as:  LOPRESSOR  Take 12.5 mg by mouth 2 (two) times daily.     QUEtiapine 25 MG tablet  Commonly known as:  SEROQUEL  Take 1 tablet (25 mg total) by mouth 2 (two) times daily.     TRIPLE ANTIBIOTIC EX  Apply 1 application topically  2 (two) times daily as needed (skin tear).           Follow-up Information    Follow up with Junious Silk, MD.   Specialty:  Endocrinology   Why:  As needed   Contact information:   [facility] [place] [address] [phone]        The results of significant diagnostics from this hospitalization (including imaging, microbiology, ancillary and laboratory) are listed below for reference.     Microbiology: Recent Results (from the past 240 hour(s))  Culture, blood (x 2)     Status: None   Collection Time: 11/26/15 10:25 PM  Result Value Ref Range Status   Specimen Description BLOOD LEFT ANTECUBITAL  Final   Special Requests BOTTLES DRAWN AEROBIC AND ANAEROBIC  Final   Culture   Final    NO GROWTH 5 DAYS Performed at [facility]    Report Status 12/02/2015 FINAL  Final  C difficile quick scan w PCR reflex     Status: None   Collection Time: 11/27/15 12:07 AM  Result Value Ref Range Status   C Diff antigen NEGATIVE NEGATIVE Final   C Diff toxin NEGATIVE NEGATIVE Final   C Diff interpretation Negative for toxigenic C. difficile  Final  Culture, blood (x 2)     Status: None   Collection  Time: 11/27/15  4:30 AM  Result Value Ref Range Status   Specimen Description BLOOD LEFT WRIST  Final   Special Requests BOTTLES DRAWN AEROBIC AND ANAEROBIC  Final   Culture   Final    NO GROWTH 5 DAYS Performed at [facility]    Report Status 12/02/2015 FINAL  Final  MRSA PCR Screening     Status: None   Collection Time: 11/27/15 10:41 PM  Result Value Ref Range Status   MRSA by PCR NEGATIVE NEGATIVE Final    Comment:        The GeneXpert MRSA Assay (FDA approved for NASAL specimens only), is one component of a comprehensive MRSA colonization surveillance program. It is not intended to diagnose MRSA infection nor to guide or monitor treatment for MRSA infections.      Labs: Basic Metabolic Panel:  Recent Labs Lab  11/26/15 2225 11/27/15 0446 11/28/15 0445 12/02/15 0421  NA 139 139 136  --   K 3.9 3.7 4.0  --   CL 107 110 111  --   CO2 23 19* 20*  --   GLUCOSE 118* 122* 96  --   BUN 25* 25* 23*  --   CREATININE 1.44* 1.28* 1.11 0.75  CALCIUM 8.9 8.3* 8.1*  --    Liver Function Tests:  Recent Labs Lab 11/26/15 2225 11/27/15 0446  AST 28 42*  ALT 18 19  ALKPHOS 65 49  BILITOT 1.0 0.9  PROT 6.8 5.7*  ALBUMIN 4.2 3.3*    Recent Labs Lab 11/26/15 2225  LIPASE 29   CBC:  Recent Labs Lab 11/26/15 2225 11/27/15 0446 11/28/15 0445 11/29/15 0850  WBC 14.7* 13.4* 16.6* 13.1*  NEUTROABS 13.1*  --   --   --   HGB 13.0 11.3* 11.3* 11.4*  HCT 38.8* 33.7* 33.8* 34.0*  MCV 93.0 92.8 93.6 91.9  PLT 178 163 176 164    BNP (last 3 results)  Recent Labs  11/27/15 0312  BNP 186.0*   CBG:  Recent Labs Lab 11/29/15 0822 11/30/15 0745 12/01/15 0839 12/02/15 0942 12/03/15 0741  GLUCAP 92 84 79 93 85   SIGNED: Time coordinating discharge: 30 minutes  MAGICK-Christoffer Currier, MD  [facility] 12/03/2015, 1:50 PM Pager [phone]  If 7PM-7AM, please contact night-coverage [email or web address] Password [email or web address]

## 2015-12-03 NOTE — Progress Notes (Signed)
Morphine Infusion discontinued and wasted in sink with Hazle Nordmann, RN.    Sundra Aland, RN

## 2015-12-17 DEATH — deceased

## 2017-01-25 IMAGING — CT CT ABD-PELV W/ CM
2 of 5 series · 15 of 46 positions shown, 17 images · IV contrast (80 ML OMNI 300)
Comparison: None.

CLINICAL DATA: [AGE] male with vomiting and diarrhea.

EXAM:
CT ABDOMEN AND PELVIS WITH CONTRAST
TECHNIQUE: Multidetector CT imaging of the abdomen and pelvis was performed
using the standard protocol following bolus administration of
intravenous contrast.
CONTRAST:  80mL OMNIPAQUE IOHEXOL 300 MG/ML  SOLN

[Series 2: abd/pel with · axial · 0.77mm/px · z∈[-605,-220]mm · 12 of 87 slices shown, 14 images]
[im 5/87  soft-tissue]
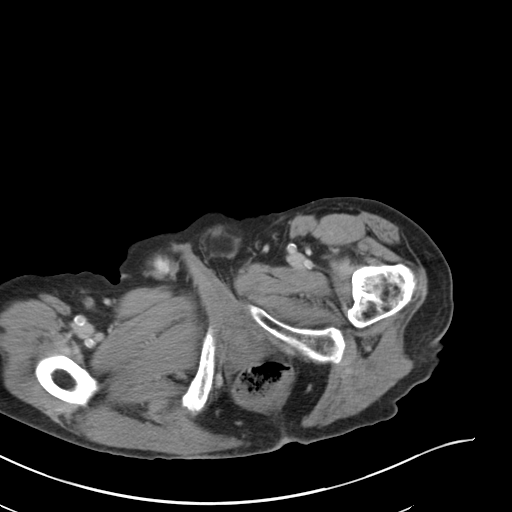
[im 5/87  bone]
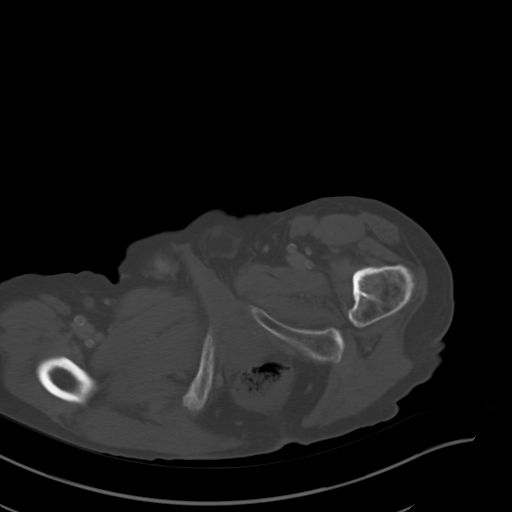
[im 13/87  soft-tissue]
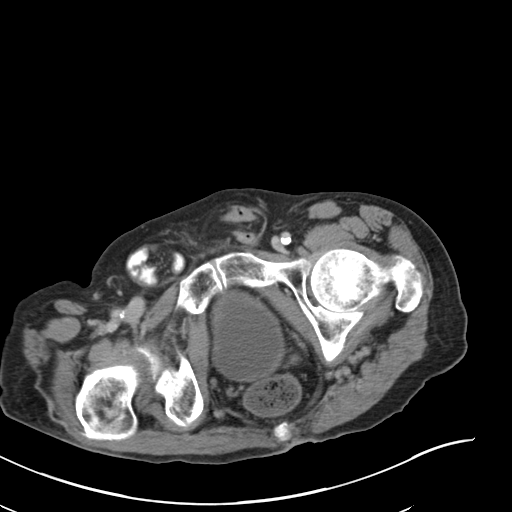
[im 18/87  soft-tissue]
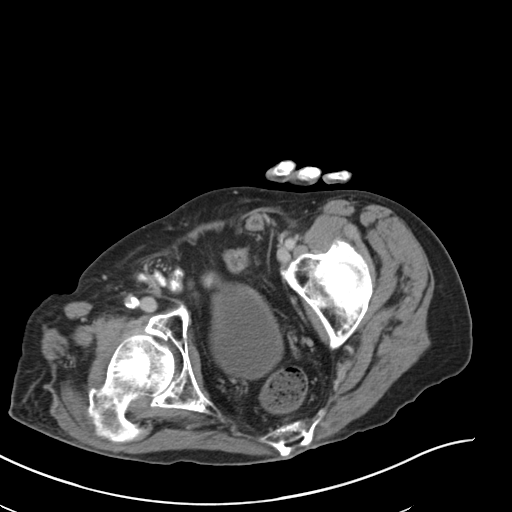
[im 26/87  soft-tissue]
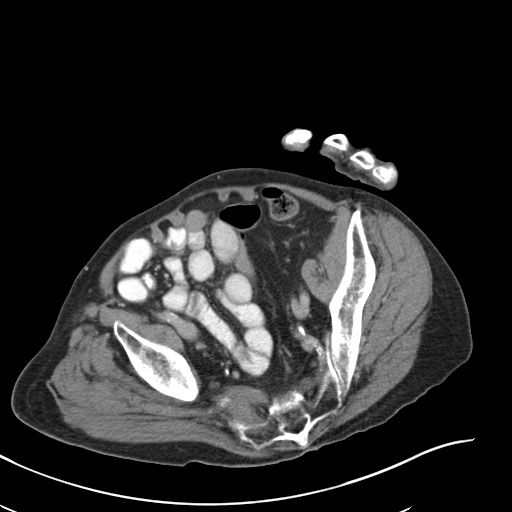
[im 35/87  soft-tissue]
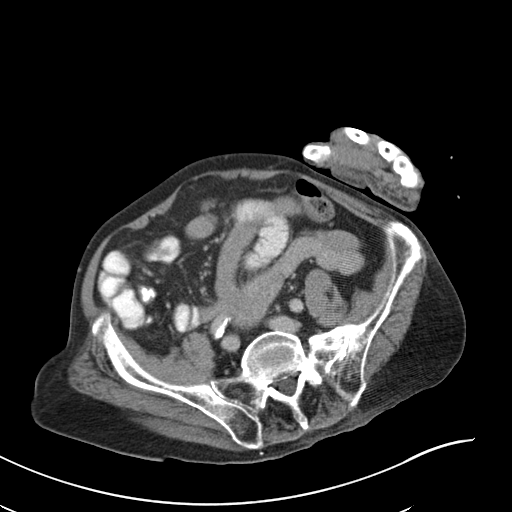
[im 39/87  soft-tissue]
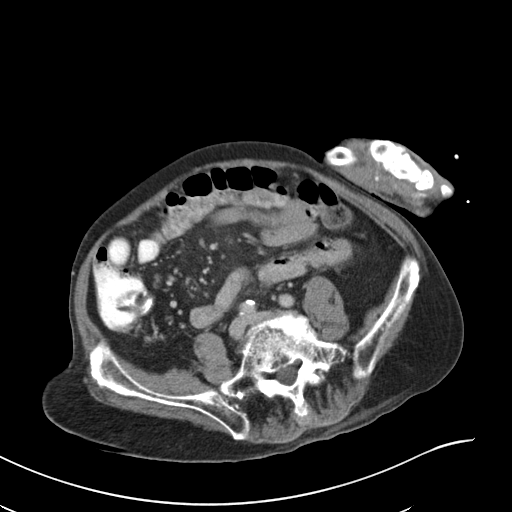
[im 48/87  soft-tissue]
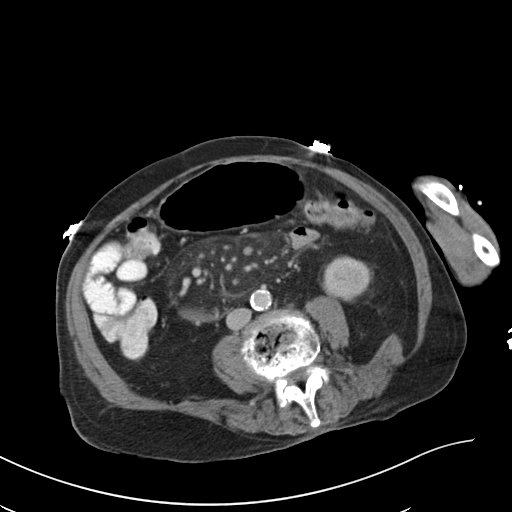
[im 52/87  soft-tissue]
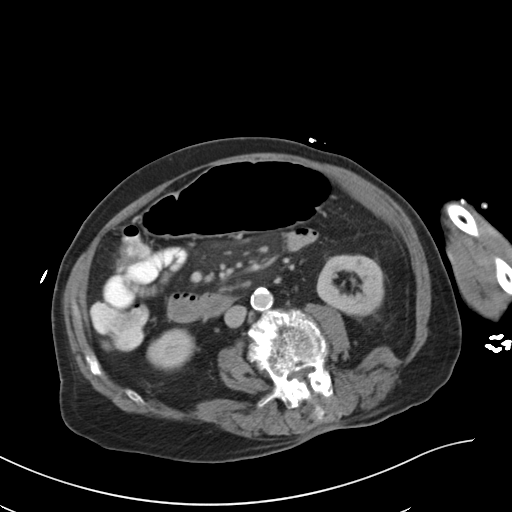
[im 61/87  soft-tissue]
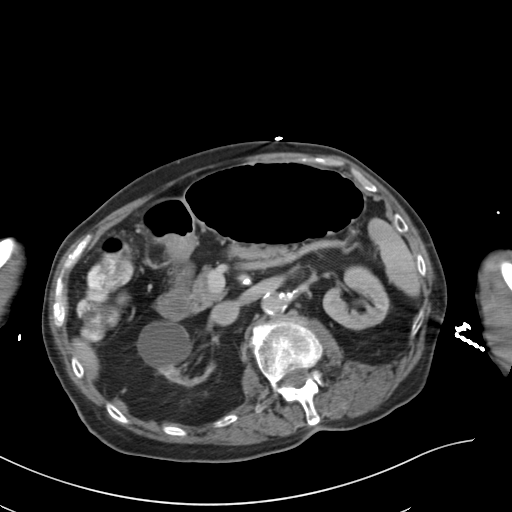
[im 61/87  bone]
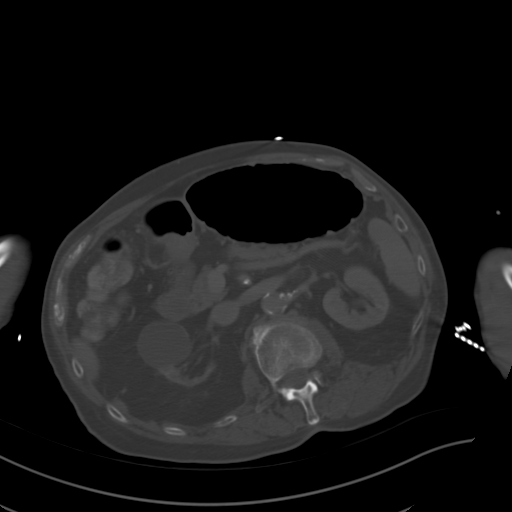
[im 69/87  soft-tissue]
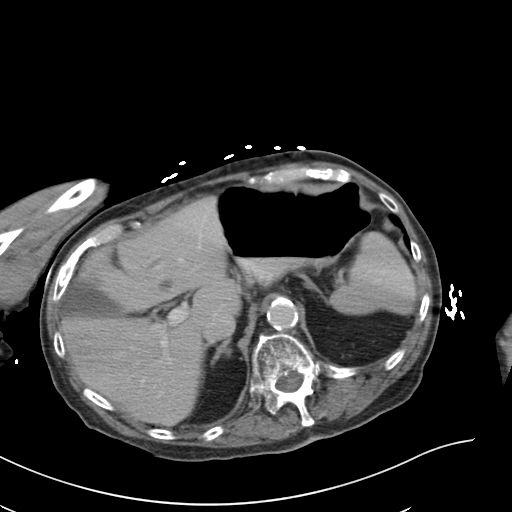
[im 74/87  soft-tissue]
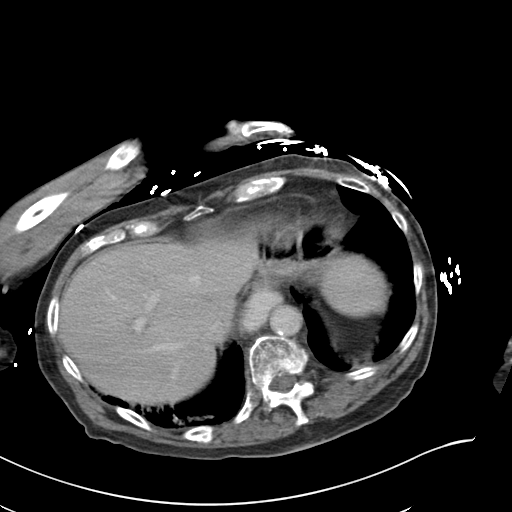
[im 82/87  soft-tissue]
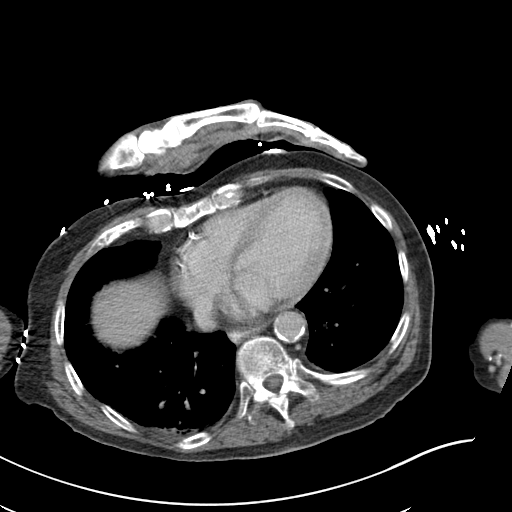

[Series 4: coronal a/|p · coronal · 0.68mm/px · 3 of 81 slices shown]
[im 27/81  soft-tissue]
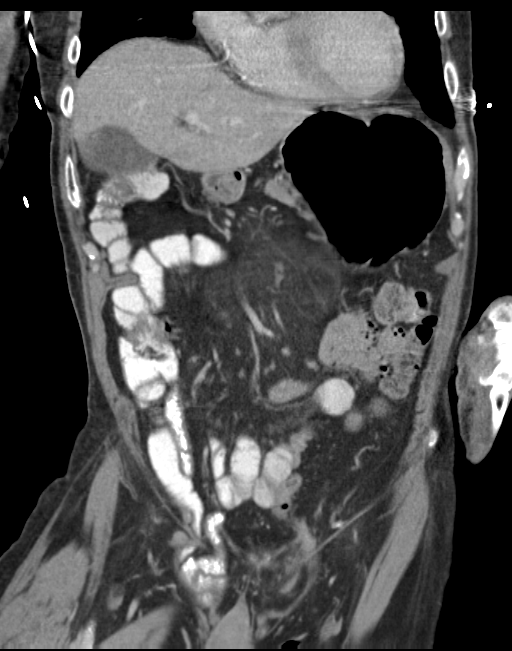
[im 36/81  soft-tissue]
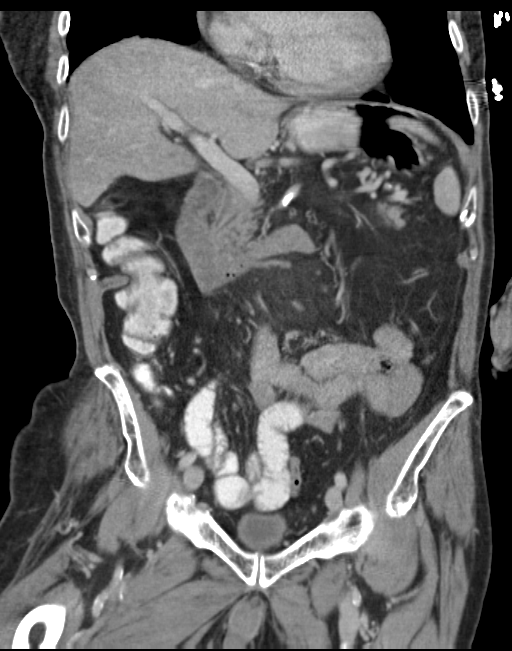
[im 45/81  soft-tissue]
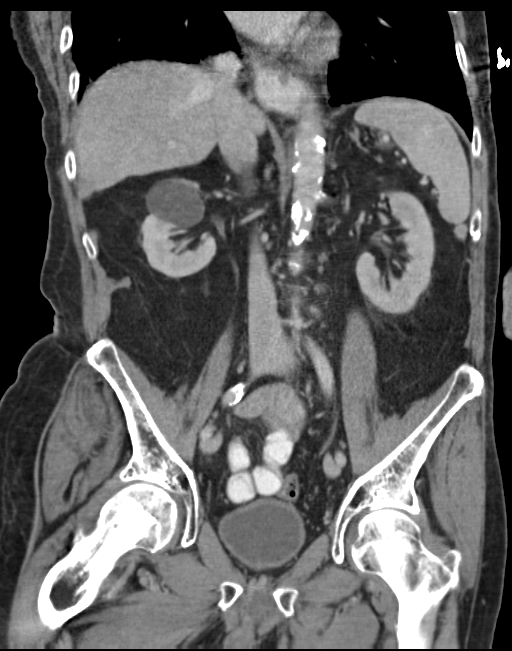

[15 of 46 positions shown; findings below may reference images not displayed]

FINDINGS: Lower chest: Right greater than left basilar atelectasis. Coronary
artery calcifications are seen. The heart is normal in size.

Liver: 1.5 cm cyst in the left lobe adjacent to the left portal
vein. No additional focal lesion.

Hepatobiliary: Gallbladder physiologically distended, no calcified
stone. No biliary dilatation.

Pancreas: No ductal dilatation or inflammation.

Spleen: Normal.

Adrenal glands: No nodule.

Kidneys: Symmetric renal enhancement and excretion. No
hydronephrosis. There is cortical scarring and atrophy of the upper
right kidney. Multiple right renal cysts, largest measuring 4.1 cm.
Tiny cortical cyst in the left kidney.

Stomach/Bowel: There is gaseous gastric distention. Enteric contrast
in the distal esophagus with small hiatal hernia. No bowel
obstruction, enteric contrast is reached the colon. There are no
dilated or thickened small bowel loops. Bilateral inguinal hernias,
containing normal appearing small bowel on the right, and normal
appearing sigmoid colon on the left. No bowel inflammation or
obstruction secondary to these hernias. Small volume of stool
throughout the colon without colonic wall thickening. Portions of
the appendix are visualized and are normal.

Vascular/Lymphatic: There is mesenteric edema in the central
mesentery with associated small mesenteric lymph nodes. Mesenteric
vessels remain patent. No retroperitoneal adenopathy. Abdominal
aorta is normal in caliber. Moderate to advanced atherosclerosis of
the abdominal aorta and its branches. No aneurysm.

Reproductive: Prostate gland is prominent size.

Bladder: Bladder physiologically distended, no wall thickening.

Other: No free air, free fluid, or intra-abdominal fluid collection.

Musculoskeletal: Scoliotic curvature and advanced multilevel
degenerative change in the spine. Prominent Schmorl's node involving
superior endplate of L1. The bones appear under mineralized. There
are no acute or suspicious osseous abnormalities.
IMPRESSION: 1. Mild gaseous gastric distention with contrast in the distal
esophagus. There is no small bowel dilatation, and findings may
reflect gastroenteritis.
2. Mesenteric edema with multiple small mesenteric lymph nodes an
appearance suggestive of "misty mesentery ". This is nonspecific
finding and can be seen in the setting of edema secondary to renal
failure, heart failure, or hypoalbuminemia; inflammation such as
sclerosing mesenteritis or less likely neoplastic.
3. Moderate bilateral inguinal hernias, containing small bowel on
the right and sigmoid colon on the left, no bowel inflammation or
obstruction.
4. Incidental findings of atherosclerosis, focal scarring and
atrophy of the upper right kidney with multiple renal cysts, and
hepatic cyst.

## 2017-01-26 IMAGING — DX DG CHEST 1V PORT
1 series · 1 of 1 positions shown · non-contrast
Comparison: 11/27/2015.

CLINICAL DATA: Shortness of breath.

EXAM:
PORTABLE CHEST 1 VIEW

[chest ap]
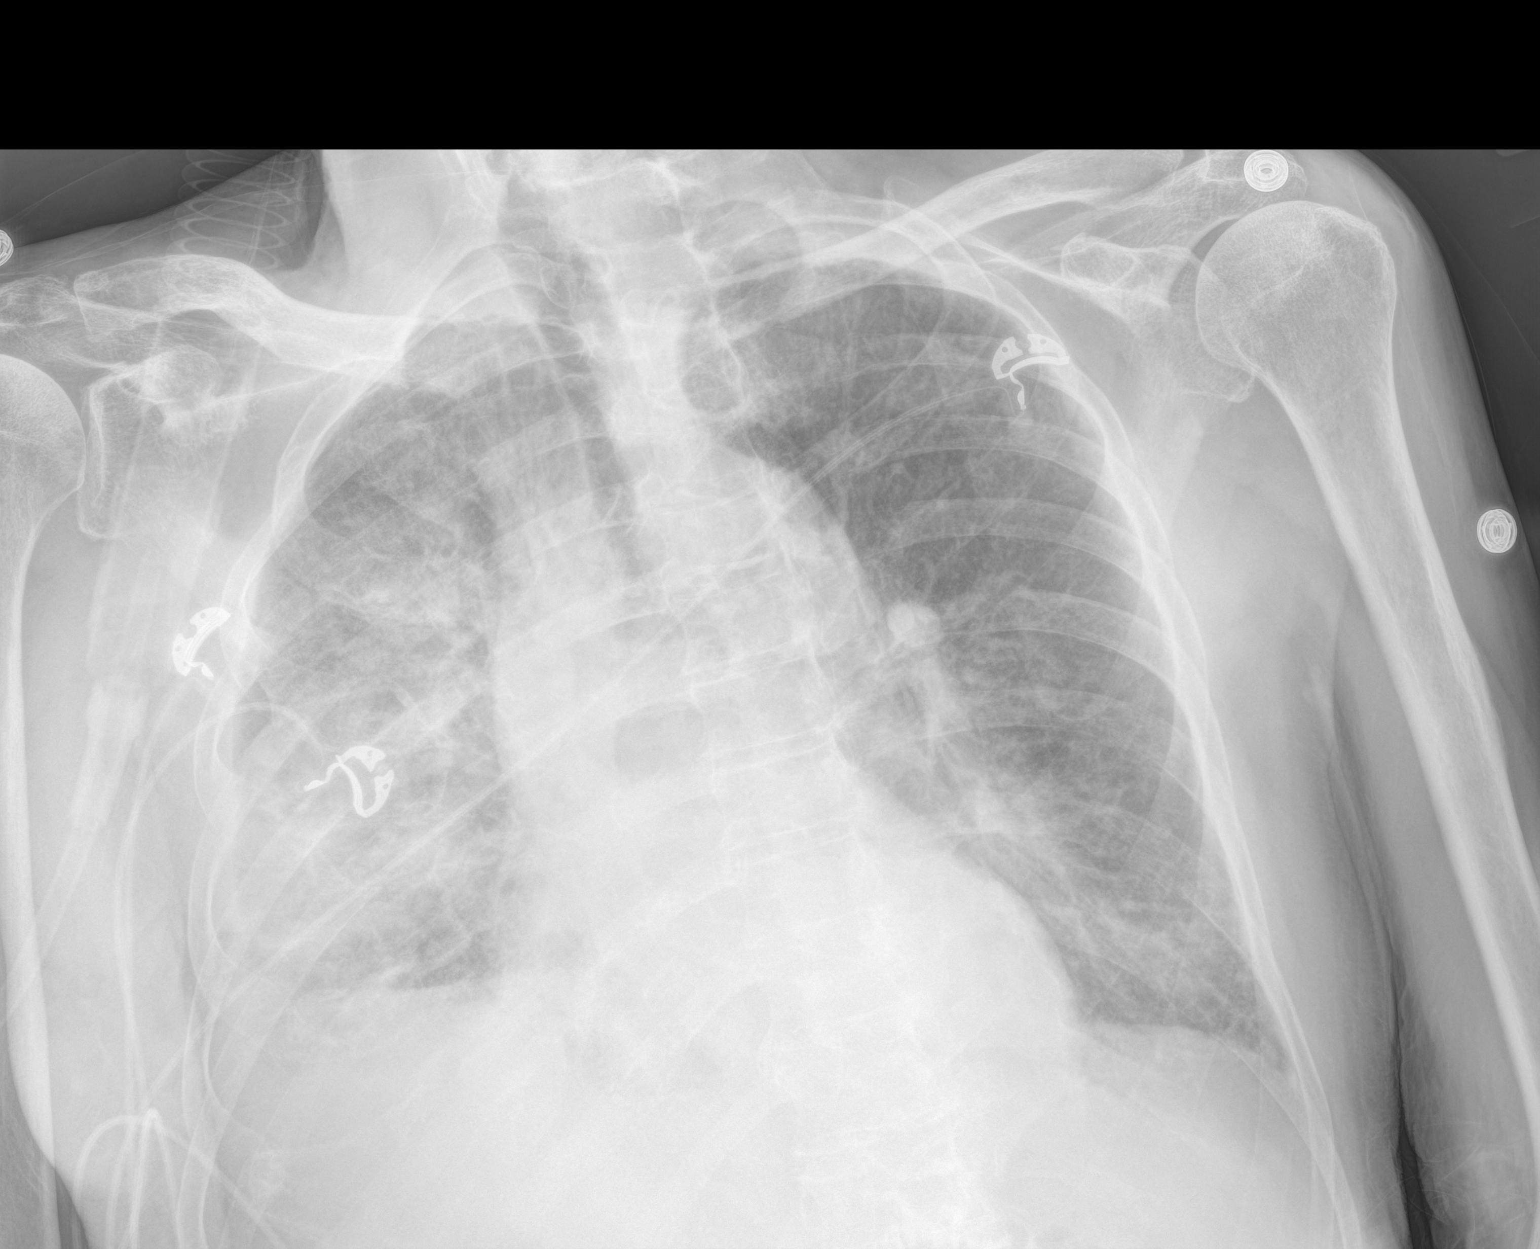

[1 of 1 positions shown; findings below may reference images not displayed]

FINDINGS: Mediastinum hilar structures stable. Stable cardiomegaly.
Progressive bilateral pulmonary infiltrates particularly prominent
throughout the right lung. Bilateral pneumonia could present this
fashion. Pulmonary edema cannot be excluded. No pleural effusion or
pneumothorax .
IMPRESSION: Progressive bilateral pulmonary infiltrates particularly prominent
throughout the right lung consistent with multifocal pneumonia.
Congestive heart failure pulmonary edema cannot be excluded.
